# Patient Record
Sex: Male | Born: 1955 | Race: White | Hispanic: No | Marital: Married | Smoking: Former smoker
Health system: Southern US, Community
[De-identification: ages and names within clinical notes are randomized; demographics above are authoritative.]

## PROBLEM LIST (undated history)

## (undated) DIAGNOSIS — I10 Essential (primary) hypertension: Secondary | ICD-10-CM

## (undated) DIAGNOSIS — K759 Inflammatory liver disease, unspecified: Secondary | ICD-10-CM

## (undated) DIAGNOSIS — M4800 Spinal stenosis, site unspecified: Secondary | ICD-10-CM

## (undated) DIAGNOSIS — M1990 Unspecified osteoarthritis, unspecified site: Secondary | ICD-10-CM

## (undated) HISTORY — DX: Spinal stenosis, site unspecified: M48.00

## (undated) HISTORY — PX: BACK SURGERY: SHX140

## (undated) HISTORY — PX: CARPAL TUNNEL RELEASE: SHX101

## (undated) HISTORY — PX: JOINT REPLACEMENT: SHX530

---

## 2003-05-19 ENCOUNTER — Emergency Department (HOSPITAL_COMMUNITY): Admission: EM | Admit: 2003-05-19 | Discharge: 2003-05-20 | Payer: Self-pay | Admitting: Emergency Medicine

## 2010-09-01 ENCOUNTER — Ambulatory Visit (HOSPITAL_COMMUNITY)
Admission: RE | Admit: 2010-09-01 | Discharge: 2010-09-01 | Disposition: A | Discharge status: Home / Self Care | Payer: BC Managed Care – PPO | Source: Ambulatory Visit | Attending: Orthopaedic Surgery | Admitting: Orthopaedic Surgery

## 2010-09-01 ENCOUNTER — Encounter (HOSPITAL_COMMUNITY)
Admission: RE | Admit: 2010-09-01 | Discharge: 2010-09-01 | Disposition: A | Discharge status: Home / Self Care | Payer: BC Managed Care – PPO | Source: Ambulatory Visit | Attending: Orthopaedic Surgery | Admitting: Orthopaedic Surgery

## 2010-09-01 ENCOUNTER — Other Ambulatory Visit (HOSPITAL_COMMUNITY): Payer: Self-pay | Admitting: Orthopaedic Surgery

## 2010-09-01 DIAGNOSIS — M169 Osteoarthritis of hip, unspecified: Secondary | ICD-10-CM | POA: Insufficient documentation

## 2010-09-01 DIAGNOSIS — Z01812 Encounter for preprocedural laboratory examination: Secondary | ICD-10-CM | POA: Insufficient documentation

## 2010-09-01 DIAGNOSIS — M1611 Unilateral primary osteoarthritis, right hip: Secondary | ICD-10-CM

## 2010-09-01 DIAGNOSIS — Z01818 Encounter for other preprocedural examination: Secondary | ICD-10-CM | POA: Insufficient documentation

## 2010-09-01 DIAGNOSIS — M1610 Unilateral primary osteoarthritis, unspecified hip: Secondary | ICD-10-CM | POA: Insufficient documentation

## 2010-09-01 LAB — DIFFERENTIAL
Basophils Absolute: 0.1 10*3/uL (ref 0.0–0.1)
Eosinophils Absolute: 0.1 10*3/uL (ref 0.0–0.7)
Lymphocytes Relative: 17 % (ref 12–46)
Lymphs Abs: 1.6 10*3/uL (ref 0.7–4.0)
Neutro Abs: 7.2 10*3/uL (ref 1.7–7.7)
Neutrophils Relative %: 74 % (ref 43–77)

## 2010-09-01 LAB — URINALYSIS, ROUTINE W REFLEX MICROSCOPIC
Glucose, UA: NEGATIVE mg/dL
Hgb urine dipstick: NEGATIVE
Ketones, ur: NEGATIVE mg/dL
Leukocytes, UA: NEGATIVE
Nitrite: NEGATIVE
Protein, ur: NEGATIVE mg/dL
Urobilinogen, UA: 0.2 mg/dL (ref 0.0–1.0)
pH: 6 (ref 5.0–8.0)

## 2010-09-01 LAB — CBC
HCT: 45.8 % (ref 39.0–52.0)
MCHC: 34.1 g/dL (ref 30.0–36.0)
MCV: 87.9 fL (ref 78.0–100.0)
Platelets: 263 10*3/uL (ref 150–400)
RBC: 5.21 MIL/uL (ref 4.22–5.81)

## 2010-09-01 LAB — COMPREHENSIVE METABOLIC PANEL
ALT: 16 U/L (ref 0–53)
AST: 18 U/L (ref 0–37)
Albumin: 4.4 g/dL (ref 3.5–5.2)
Alkaline Phosphatase: 93 U/L (ref 39–117)
GFR calc Af Amer: >60 mL/min (ref >60)
GFR calc non Af Amer: >60 mL/min (ref >60)
Potassium: 5.2 mEq/L — ABNORMAL HIGH (ref 3.5–5.1)
Total Bilirubin: 0.3 mg/dL (ref 0.3–1.2)

## 2010-09-01 LAB — APTT: aPTT: 30 seconds (ref 24–37)

## 2010-09-01 LAB — ABO/RH: ABO/RH(D): O POS

## 2010-09-02 LAB — URINE CULTURE
Colony Count: NO GROWTH
Culture: NO GROWTH

## 2010-09-09 ENCOUNTER — Inpatient Hospital Stay (HOSPITAL_COMMUNITY): Payer: BC Managed Care – PPO

## 2010-09-09 ENCOUNTER — Inpatient Hospital Stay (HOSPITAL_COMMUNITY)
Admission: RE | Admit: 2010-09-09 | Discharge: 2010-09-12 | DRG: 818 | Disposition: A | Discharge status: Home / Self Care | Payer: BC Managed Care – PPO | Source: Ambulatory Visit | Attending: Orthopaedic Surgery | Admitting: Orthopaedic Surgery

## 2010-09-09 DIAGNOSIS — IMO0002 Reserved for concepts with insufficient information to code with codable children: Secondary | ICD-10-CM | POA: Diagnosis not present

## 2010-09-09 DIAGNOSIS — F17200 Nicotine dependence, unspecified, uncomplicated: Secondary | ICD-10-CM | POA: Diagnosis present

## 2010-09-09 DIAGNOSIS — M169 Osteoarthritis of hip, unspecified: Principal | ICD-10-CM | POA: Diagnosis present

## 2010-09-09 DIAGNOSIS — Y658 Other specified misadventures during surgical and medical care: Secondary | ICD-10-CM | POA: Diagnosis not present

## 2010-09-09 DIAGNOSIS — E669 Obesity, unspecified: Secondary | ICD-10-CM | POA: Diagnosis present

## 2010-09-09 DIAGNOSIS — M1610 Unilateral primary osteoarthritis, unspecified hip: Principal | ICD-10-CM | POA: Diagnosis present

## 2010-09-09 DIAGNOSIS — D62 Acute posthemorrhagic anemia: Secondary | ICD-10-CM | POA: Diagnosis not present

## 2010-09-09 DIAGNOSIS — I10 Essential (primary) hypertension: Secondary | ICD-10-CM | POA: Diagnosis present

## 2010-09-10 ENCOUNTER — Inpatient Hospital Stay (HOSPITAL_COMMUNITY): Payer: BC Managed Care – PPO

## 2010-09-10 LAB — CBC
Hemoglobin: 11.2 g/dL — ABNORMAL LOW (ref 13.0–17.0)
MCH: 29.6 pg (ref 26.0–34.0)
MCV: 90 fL (ref 78.0–100.0)
WBC: 10.2 10*3/uL (ref 4.0–10.5)

## 2010-09-10 LAB — BASIC METABOLIC PANEL
BUN: 20 mg/dL (ref 6–23)
Calcium: 8.8 mg/dL (ref 8.4–10.5)
Creatinine, Ser: 1.01 mg/dL (ref 0.50–1.35)
GFR calc Af Amer: >60 mL/min (ref >60)
Potassium: 4.6 mEq/L (ref 3.5–5.1)
Sodium: 139 mEq/L (ref 135–145)

## 2010-09-11 LAB — BASIC METABOLIC PANEL
BUN: 11 mg/dL (ref 6–23)
CO2: 31 mEq/L (ref 19–32)
Calcium: 8.7 mg/dL (ref 8.4–10.5)
Chloride: 102 mEq/L (ref 96–112)
GFR calc Af Amer: >60 mL/min (ref >60)
Glucose, Bld: 144 mg/dL — ABNORMAL HIGH (ref 70–99)
Sodium: 137 mEq/L (ref 135–145)

## 2010-09-11 LAB — CBC
HCT: 29.6 % — ABNORMAL LOW (ref 39.0–52.0)
MCHC: 34.1 g/dL (ref 30.0–36.0)
MCV: 87.6 fL (ref 78.0–100.0)
Platelets: 188 10*3/uL (ref 150–400)

## 2010-09-12 ENCOUNTER — Inpatient Hospital Stay (HOSPITAL_COMMUNITY): Payer: BC Managed Care – PPO

## 2010-09-12 LAB — CBC
Hemoglobin: 10.2 g/dL — ABNORMAL LOW (ref 13.0–17.0)
MCHC: 33.4 g/dL (ref 30.0–36.0)
MCV: 89.2 fL (ref 78.0–100.0)
RBC: 3.42 MIL/uL — ABNORMAL LOW (ref 4.22–5.81)
RDW: 12.8 % (ref 11.5–15.5)
WBC: 12.2 10*3/uL — ABNORMAL HIGH (ref 4.0–10.5)

## 2010-09-12 LAB — CROSSMATCH
Unit division: 0
Unit division: 0

## 2010-09-12 LAB — BASIC METABOLIC PANEL
CO2: 32 mEq/L (ref 19–32)
Calcium: 9 mg/dL (ref 8.4–10.5)
Creatinine, Ser: 0.75 mg/dL (ref 0.50–1.35)
GFR calc Af Amer: >60 mL/min (ref >60)

## 2010-09-12 LAB — URINALYSIS, ROUTINE W REFLEX MICROSCOPIC
Glucose, UA: NEGATIVE mg/dL
Hgb urine dipstick: NEGATIVE
Specific Gravity, Urine: 1.018 (ref 1.005–1.030)
Urobilinogen, UA: 1 mg/dL (ref 0.0–1.0)

## 2010-09-13 LAB — URINE CULTURE: Culture: NO GROWTH

## 2010-09-17 NOTE — Op Note (Signed)
NAMEMarland Stevenson  JACEON, HEIBERGER NO.:  [id number]  MEDICAL RECORD NO.:  [id number]  LOCATION:  5009                         FACILITY:  [facility]  PHYSICIAN:  [name]. [name], M.D.DATE OF BIRTH:  05-02-1955  DATE OF PROCEDURE:  09/09/2010 DATE OF DISCHARGE:                              OPERATIVE REPORT   PREOPERATIVE DIAGNOSIS:  End-stage osteoarthritis, right hip.  POSTOPERATIVE DIAGNOSIS:  End-stage osteoarthritis, right hip.  PROCEDURE:  Right total hip replacement.  SURGEON:  [name]. [name], MD  ASSISTANT:  [name]. [name], PA-C  COMPLICATIONS:  Small crack in the calcar fixed with Dall-Miles cable.  COMPONENTS:  DePuy AML 13.5 mm small stature femoral stem, a 36-mm outer diameter hip ball with a +5 mm neck length, 52-mm outer diameter metallic acetabulum press-fit with a Marathon +4 polyp polyethylene component, and an apex hole eliminator.  PROCEDURE IN DETAIL:  Mr. Theodore Stevenson was met in the holding area, identified the right lower extremity as the appropriate operative extremity, and any questions were answered.  He was then transported to room #1 and placed under general orotracheal anesthesia without difficulty.  Nursing staff inserted a Foley catheter.  Urine was clear.  The patient was then placed in the lateral decubitus position with the right side up and secured to the operating room table with the Innomed hip system.  The right lower extremity was prepped from iliac crest to the midcalf with chlorhexidine scrub and then DuraPrep.  Sterile draping was performed.  A routine southern incision was utilized and via sharp dissection carried down to the subcutaneous tissue.  Gross bleeders were Bovie coagulated.  Adipose tissue was incised to the level of the iliotibial band.  Self-retaining retractors were inserted.  The iliotibial band was then incised along the length of the skin incision.  Retractors were placed more deeply.  With the hip  internally rotated, the short external rotators were identified.  Tendinous structures were tagged with 0- Ethibond suture.  The capsule was then identified and incised along the femoral neck and head.  There was at least 10 mL clear yellow joint effusion.  At that point, the head was dislocated posteriorly.  There was flattening with at least 40% of the head with loss of articular cartilage and abundant synovitis.  Using the calcar guide, the femoral head was osteotomized at about 4 mm proximal to the head-neck junction.  Head was then removed. The acetabulum was inspected.  There were loose pieces of articular cartilage within the joint that were removed and also abundant synovitis which was also resected.  The Muller retractor was then placed around the femoral neck.  It appeared to be too long, but I proceeded with reaming.  A starter hole was then made in the piriformis fossa followed by the canal finder. Reaming was performed to 13 mm to accept a 13.5 mm component.  The initial 10.5 mm rasp was inserted and using it as a guide, I re-cut the calcar at a point about a fingerbreadth proximal to the lesser trochanter.  Reaming was then performed., then I re-reamed to 13 mm and then inserted the 10.5 component which fit very nicely.  We had templated a 15-mm component, but  I felt that was too large and proceeded with subsequent reaming to a 12 mm and then a 13.5 mm small stature rasp.  About three-quarters to the way into the canal with the rasp, there was a crack in the calcar and it would open up about a millimeter and I was concerned about propagation, so I inserted a [employer] cable without difficulty.  There was maybe a millimeter separation but it did not propagate after insertion of the cable.  It had a nice fit with about 20 degrees of anteversion on the 13.5 mm femoral component.  It was nice and tight.  I did use the side reamer along to be sure that I had plenty of lateral  reaming.  The acetabulum was then exposed.  Acetabulum retractors were inserted. There was a tear of the labrum and a large well-developed labrum that was sharply excised with a #15 blade knife.  I had nice exposure of the acetabulum and reamed to a 51 to accept a 52-mm component.  The acetabulum was shallow and I deepened it nicely.  I then trialed a 50-mm component, it had nice rim fit but it would completely seat and then trialed a 52 and it had nice rim fit but would not completely seat. Accordingly, the 52-mm outer diameter sector 3 acetabular component was then impacted.  It was nice and tight.  I did not require any screws.  I then inserted the trial polyethylene liner.  The 13.5 mm small stature rasp was then carefully impacted.  It was probably several millimeters proud but there was no propagation of the crack with the [employer] cable.  I then applied the 36-mm outer diameter hip ball with a +5 neck length and then reduced the entire construct.  Through a full range of motion, we had perfect stability. There was no toggling.  It felt like a reestablished leg length since he was approximately a 0.5 to 5/8th of an inch short preoperatively.  The trial components were then removed.  The joint was copiously irrigated with saline solution.  The apex hole eliminator was inserted followed by the Marathon polyethylene +4 liner.  The wound was again irrigated with saline solution.  The +5 final 36-mm outer diameter hip ball was then applied to the [facility] taper stem and then reduced.  Again through a full range of motion, we had perfect stability.  We made sure that the Mattie Marlin cable was perfectly tight.  We then crimped it and then cut off the end and it was not proud nor it was impinging.  The wound was again irrigated with saline solution.  We checked the calcar crack and it was probably a millimeter.  The capsule was then closed anatomically with #1 Ethibond.  Short external  rotators were closed with the same material.  The wound was again irrigated.  The iliotibial band was closed with a running 0-Vicryl subcu and several layers with Vicryl and 3-0 Monocryl in the subcu. Skin was closed with skin clips.  Sterile bulky dressing was applied.  The patient tolerated the procedure without any anesthetic complications.     [name]. [name], M.D.    PWW/MEDQ  D:  09/09/2010  T:  09/09/2010  Job:  [id number]  Electronically Signed by [name] M.D. on 09/17/2010 02:06:34 PM

## 2011-06-18 ENCOUNTER — Encounter (HOSPITAL_COMMUNITY): Payer: Self-pay | Admitting: Pharmacy Technician

## 2011-06-22 ENCOUNTER — Encounter (HOSPITAL_COMMUNITY): Payer: Self-pay | Admitting: *Deleted

## 2011-06-24 ENCOUNTER — Encounter (HOSPITAL_COMMUNITY)
Admission: RE | Admit: 2011-06-24 | Discharge: 2011-06-24 | Disposition: A | Discharge status: Home / Self Care | Payer: BC Managed Care – PPO | Source: Ambulatory Visit | Attending: Orthopaedic Surgery | Admitting: Orthopaedic Surgery

## 2011-06-24 LAB — DIFFERENTIAL
Eosinophils Absolute: 0.1 10*3/uL (ref 0.0–0.7)
Eosinophils Relative: 2 % (ref 0–5)
Lymphocytes Relative: 24 % (ref 12–46)
Lymphs Abs: 1.8 10*3/uL (ref 0.7–4.0)
Monocytes Relative: 12 % (ref 3–12)

## 2011-06-24 LAB — COMPREHENSIVE METABOLIC PANEL
ALT: 28 U/L (ref 0–53)
Albumin: 3.8 g/dL (ref 3.5–5.2)
Alkaline Phosphatase: 107 U/L (ref 39–117)
Calcium: 9.7 mg/dL (ref 8.4–10.5)
Potassium: 4.3 mEq/L (ref 3.5–5.1)
Sodium: 138 mEq/L (ref 135–145)
Total Protein: 6.9 g/dL (ref 6.0–8.3)

## 2011-06-24 LAB — URINALYSIS, ROUTINE W REFLEX MICROSCOPIC
Bilirubin Urine: NEGATIVE
Ketones, ur: NEGATIVE mg/dL
Leukocytes, UA: NEGATIVE
Nitrite: NEGATIVE
Urobilinogen, UA: 0.2 mg/dL (ref 0.0–1.0)
pH: 5.5 (ref 5.0–8.0)

## 2011-06-24 LAB — PROTIME-INR
INR: 0.98 (ref 0.00–1.49)
Prothrombin Time: 13.2 seconds (ref 11.6–15.2)

## 2011-06-24 LAB — TYPE AND SCREEN: Antibody Screen: NEGATIVE

## 2011-06-24 LAB — APTT: aPTT: 28 seconds (ref 24–37)

## 2011-06-24 LAB — SURGICAL PCR SCREEN
MRSA, PCR: NEGATIVE
Staphylococcus aureus: NEGATIVE

## 2011-06-24 LAB — CBC
MCH: 30.2 pg (ref 26.0–34.0)
MCHC: 33.2 g/dL (ref 30.0–36.0)
Platelets: 231 10*3/uL (ref 150–400)
RDW: 12.9 % (ref 11.5–15.5)

## 2011-06-24 NOTE — Pre-Procedure Instructions (Signed)
20 Theodore Stevenson  06/24/2011   Your procedure is scheduled on:  Tuesday June 18  Report to [facility] at 11:30 AM.  Call this number if you have problems the morning of surgery: [phone]   Remember:   Do not eat or drink:After Midnight.    Take these medicines the morning of surgery with A SIP OF WATER: Tramadol if needed   Do not wear jewelry, make-up or nail polish.  Do not wear lotions, powders, or perfumes. You may wear deodorant.  Do not shave 48 hours prior to surgery. Men may shave face and neck.  Do not bring valuables to the hospital.  Contacts, dentures or bridgework may not be worn into surgery.  Leave suitcase in the car. After surgery it may be brought to your room.  For patients admitted to the hospital, checkout time is 11:00 AM the day of discharge.   Patients discharged the day of surgery will not be allowed to drive home.  Name and phone number of your driver: NA  Special Instructions: Incentive Spirometry - Practice and bring it with you on the day of surgery. and CHG Shower Use Special Wash: 1/2 bottle night before surgery and 1/2 bottle morning of surgery.   Please read over the following fact sheets that you were given: Pain Booklet, Coughing and Deep Breathing, Blood Transfusion Information, Total Joint Packet and Surgical Site Infection Prevention

## 2011-06-25 LAB — URINE CULTURE: Culture  Setup Time: 201306122154

## 2011-06-25 NOTE — H&P (Signed)
CHIEF COMPLAINT:  Painful left hip.   HISTORY:   Theodore Stevenson is a very pleasant 56 year old white male who is seen today for evaluation of his left hip. He has had problems with pain in the left hip dating back to earlier this year. He was initially seen in the office on the 20th of March 2013. At that time he started having a gradual onset of left hip and knee pain which started without any history of injury or trauma. He was to the point that he was having more groin pain and anterior thigh pain at that time. He was having this moderate stabbing pain and it had not really improved at all. He had taken a couple of Aleve but nothing had been helping him. He states that it was very similar to what he had in his right hip prior to total hip replacement. He denied any neurovascular compromise at that time. He did have a corticosteroid injection on the 25th of March 2013 as well as April 22, 2011. He has most recently had injections for lumbosacral complaints but at his last injection on May 19, 2011 he was noted to have some change in the femoral head. He returned to the office on Jun 10, 2011 in rather excruciating pain and discomfort. His last injection on May 29th was of no benefit and it actually started to worsen to the point where he is using crutches. He now is in constant severe throbbing and aching pain especially in his groin. He is also having some anterior thigh pain. Denies any numbness or neurologic symptoms. The pain is aching and grabbing and relentless. He is unable to sleep. He is unable to ambulate. He is unable to do any of the activities of daily living. He was noted on x-rays of the 29th of May that he had complete collapse of his femoral head probably consistent with avascular necrosis. At that time he was given clearance forms for possible upcoming surgery and was treated as best we could with medications. He had smoked previously but he has been dipping tobacco and swallowing it also.  He has had  several courses of prednisone in the past. He returns today now with increasing symptoms and pain and would like to consider a total hip arthroplasty.  PAST MEDICAL HISTORY:   In general his health is good. Surgeries have included that in 1987 for back surgery.  2011 and 2012 for carpal tunnel surgery in both hands. 2012 for a right total hip arthroplasty.  CURRENT MEDICATION:  Medications include that of Percocet 5/325 1-2 q.4-6 h. p.r.n. pain. Benazepril 40 mg daily. Vytorin 10/40 daily. Flexeril 10 mg t.i.d.  Allergies: Penicillin, Darvocet, and codeine. He did have some sensitivity to Percocet causing him irritability and being wired. Have used Ultram.  REVIEW OF SYSTEMS:   A 14 point review of systems is positive for glasses and contacts as well as tinnitus. He does have occasional bronchitis. He does have a history of hypertension and is presently on medications for this, that of benazepril. He did have hepatitis in 1973. Previous history of ulcers.  FAMILY HISTORY:  Family history is unremarkable.   SOCIAL HISTORY:  He is a pleasant 56 year old white male who is married and self-employed in [employer]. He has quit smoking cigarettes but does have a 10-20 pack year history. He states he does not drink. He does and has chewed tobacco up until 2012.  PHYSICAL EXAM:  Examination today reveals a very pleasant 56 year old white male.  Well-developed, well-nourished, alert, pleasant and cooperative.  He is 5 foot 5 inches and weighs 180 pounds with a BMI of 30.0.  Vital signs reveal a temperature of 97.5, pulse 83, respirations 18, blood pressure 164/78.  Head is normocephalic. Eyes: pupils equal, round and react to light and accommodation with extraocular movements intact.   Neck was supple; no bruits. Chest had good expansion. Lungs were clear to auscultation. Cardiac had a regular rhythm and rate; normal S1-S2. No discrete murmurs were noted. Abdomen is scaphoid, soft and nontender.  No mass palpable. Normal bowel sounds present. CNS: he is oriented x3 and cranial nerves II through XII are grossly intact. Genital, rectal, and breast exams not indicated for surgery. Musculoskeletal: he has essentially no motion in his left hip. Any motion, internal and external rotation causes him extreme pain and discomfort. He is able to sit with the hip to 90 degrees. He is neurovascularly intact distally. He has good knee motion. Skin is intact.  I have reviewed a preoperative clearance form from Dr. Doristine Counter who states that from his standpoint he feels that he is cleared for surgery from a medical and cardiac standpoint. If any consultation is necessary we are to obtain it from the hospitalist.  X-RAYS: X-rays reveal marked flattening of the femoral head with essentially AVN appearance. 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  CLINICAL IMPRESSION:   1.  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  2.  Hypertension. 3.  History of hepatitis. 4.  History of phlebitis/blood clots.   recommendations: At this time we feel that he would be a candidate for a left total hip arthroplasty. Procedure risks and benefits were explained to him again and he is fully understanding. He has had all his questions answered. Therefore we will proceed with total joint replacement in the near future.   [name] Aleda Grana [facility] [phone]  06/25/2011 8:16 PM

## 2011-06-29 MED ORDER — CEFAZOLIN SODIUM-DEXTROSE 2-3 GM-% IV SOLR
2.0000 g | INTRAVENOUS | Status: AC
  Administered 2011-06-30 (×2): .5 g via INTRAVENOUS
  Filled 2011-06-29: qty 50

## 2011-06-29 NOTE — Progress Notes (Signed)
Notified pt. Of time change. Instructed him to be here at 1030 tomorrow.

## 2011-06-30 ENCOUNTER — Encounter (HOSPITAL_COMMUNITY)
Admission: RE | Disposition: A | Discharge status: Home / Self Care | Payer: Self-pay | Source: Ambulatory Visit | Attending: Orthopaedic Surgery

## 2011-06-30 ENCOUNTER — Encounter (HOSPITAL_COMMUNITY): Payer: Self-pay | Admitting: Certified Registered"

## 2011-06-30 ENCOUNTER — Inpatient Hospital Stay (HOSPITAL_COMMUNITY)
Admission: RE | Admit: 2011-06-30 | Discharge: 2011-07-02 | DRG: 818 | Disposition: A | Discharge status: Home / Self Care | Payer: BC Managed Care – PPO | Source: Ambulatory Visit | Attending: Orthopaedic Surgery | Admitting: Orthopaedic Surgery

## 2011-06-30 ENCOUNTER — Ambulatory Visit (HOSPITAL_COMMUNITY): Payer: BC Managed Care – PPO | Admitting: Certified Registered"

## 2011-06-30 ENCOUNTER — Encounter (HOSPITAL_COMMUNITY): Payer: Self-pay | Admitting: *Deleted

## 2011-06-30 ENCOUNTER — Ambulatory Visit (HOSPITAL_COMMUNITY): Payer: BC Managed Care – PPO

## 2011-06-30 DIAGNOSIS — Z87891 Personal history of nicotine dependence: Secondary | ICD-10-CM

## 2011-06-30 DIAGNOSIS — I10 Essential (primary) hypertension: Secondary | ICD-10-CM | POA: Diagnosis present

## 2011-06-30 DIAGNOSIS — D62 Acute posthemorrhagic anemia: Secondary | ICD-10-CM | POA: Diagnosis not present

## 2011-06-30 DIAGNOSIS — M8700 Idiopathic aseptic necrosis of unspecified bone: Secondary | ICD-10-CM | POA: Diagnosis present

## 2011-06-30 DIAGNOSIS — Z96649 Presence of unspecified artificial hip joint: Secondary | ICD-10-CM

## 2011-06-30 DIAGNOSIS — E7800 Pure hypercholesterolemia, unspecified: Secondary | ICD-10-CM | POA: Diagnosis present

## 2011-06-30 DIAGNOSIS — M87059 Idiopathic aseptic necrosis of unspecified femur: Secondary | ICD-10-CM | POA: Diagnosis present

## 2011-06-30 DIAGNOSIS — Z885 Allergy status to narcotic agent status: Secondary | ICD-10-CM

## 2011-06-30 DIAGNOSIS — Z880 Allergy status to penicillin: Secondary | ICD-10-CM

## 2011-06-30 DIAGNOSIS — Z01812 Encounter for preprocedural laboratory examination: Secondary | ICD-10-CM

## 2011-06-30 DIAGNOSIS — Z86718 Personal history of other venous thrombosis and embolism: Secondary | ICD-10-CM

## 2011-06-30 DIAGNOSIS — Z8619 Personal history of other infectious and parasitic diseases: Secondary | ICD-10-CM

## 2011-06-30 DIAGNOSIS — M169 Osteoarthritis of hip, unspecified: Principal | ICD-10-CM | POA: Diagnosis present

## 2011-06-30 DIAGNOSIS — M1610 Unilateral primary osteoarthritis, unspecified hip: Principal | ICD-10-CM | POA: Diagnosis present

## 2011-06-30 HISTORY — PX: TOTAL HIP ARTHROPLASTY: SHX124

## 2011-06-30 HISTORY — PX: HIP SURGERY: SHX245

## 2011-06-30 HISTORY — DX: Unspecified osteoarthritis, unspecified site: M19.90

## 2011-06-30 HISTORY — DX: Inflammatory liver disease, unspecified: K75.9

## 2011-06-30 HISTORY — DX: Essential (primary) hypertension: I10

## 2011-06-30 SURGERY — ARTHROPLASTY, HIP, TOTAL,POSTERIOR APPROACH
Anesthesia: General | Site: Hip | Laterality: Left | Wound class: Clean

## 2011-06-30 MED ORDER — SENNOSIDES-DOCUSATE SODIUM 8.6-50 MG PO TABS: 1.0000 | ORAL_TABLET | Freq: Every evening | ORAL | Status: DC | PRN

## 2011-06-30 MED ORDER — PHENOL 1.4 % MT LIQD: 1.0000 | OROMUCOSAL | Status: DC | PRN

## 2011-06-30 MED ORDER — EZETIMIBE-SIMVASTATIN 10-40 MG PO TABS
1.0000 | ORAL_TABLET | Freq: Every day | ORAL | Status: DC
  Administered 2011-07-01 – 2011-07-02 (×2): 1 via ORAL
  Filled 2011-06-30 (×2): qty 1

## 2011-06-30 MED ORDER — PROMETHAZINE HCL 25 MG/ML IJ SOLN: 6.2500 mg | INTRAMUSCULAR | Status: DC | PRN

## 2011-06-30 MED ORDER — ACETAMINOPHEN 10 MG/ML IV SOLN
INTRAVENOUS | Status: AC
  Filled 2011-06-30: qty 100

## 2011-06-30 MED ORDER — METHOCARBAMOL 500 MG PO TABS
500.0000 mg | ORAL_TABLET | Freq: Four times a day (QID) | ORAL | Status: DC | PRN
  Administered 2011-06-30 – 2011-07-02 (×3): 500 mg via ORAL
  Filled 2011-06-30 (×3): qty 1

## 2011-06-30 MED ORDER — CEFAZOLIN SODIUM-DEXTROSE 2-3 GM-% IV SOLR
2.0000 g | Freq: Four times a day (QID) | INTRAVENOUS | Status: AC
  Administered 2011-06-30 (×2): 2 g via INTRAVENOUS
  Filled 2011-06-30 (×2): qty 50

## 2011-06-30 MED ORDER — NEOSTIGMINE METHYLSULFATE 1 MG/ML IJ SOLN
INTRAMUSCULAR | Status: DC | PRN
  Administered 2011-06-30: 4 mg via INTRAVENOUS

## 2011-06-30 MED ORDER — SODIUM CHLORIDE 0.9 % IV SOLN: INTRAVENOUS | Status: DC

## 2011-06-30 MED ORDER — KETOROLAC TROMETHAMINE 15 MG/ML IJ SOLN
15.0000 mg | Freq: Four times a day (QID) | INTRAMUSCULAR | Status: AC
  Administered 2011-06-30 – 2011-07-01 (×4): 15 mg via INTRAVENOUS
  Filled 2011-06-30 (×5): qty 1

## 2011-06-30 MED ORDER — ACETAMINOPHEN 10 MG/ML IV SOLN
1000.0000 mg | Freq: Once | INTRAVENOUS | Status: AC
  Administered 2011-06-30: 1000 mg via INTRAVENOUS
  Filled 2011-06-30: qty 100

## 2011-06-30 MED ORDER — ALUM & MAG HYDROXIDE-SIMETH 200-200-20 MG/5ML PO SUSP: 30.0000 mL | ORAL | Status: DC | PRN

## 2011-06-30 MED ORDER — BISACODYL 10 MG RE SUPP: 10.0000 mg | Freq: Every day | RECTAL | Status: DC | PRN

## 2011-06-30 MED ORDER — CHLORHEXIDINE GLUCONATE 4 % EX LIQD: 60.0000 mL | Freq: Every day | CUTANEOUS | Status: DC

## 2011-06-30 MED ORDER — PROPOFOL 10 MG/ML IV EMUL
INTRAVENOUS | Status: DC | PRN
  Administered 2011-06-30: 200 mg via INTRAVENOUS

## 2011-06-30 MED ORDER — ONDANSETRON HCL 4 MG/2ML IJ SOLN: 4.0000 mg | Freq: Four times a day (QID) | INTRAMUSCULAR | Status: DC | PRN

## 2011-06-30 MED ORDER — DOCUSATE SODIUM 100 MG PO CAPS
100.0000 mg | ORAL_CAPSULE | Freq: Two times a day (BID) | ORAL | Status: DC
  Administered 2011-06-30 – 2011-07-02 (×4): 100 mg via ORAL
  Filled 2011-06-30 (×6): qty 1

## 2011-06-30 MED ORDER — CHLORHEXIDINE GLUCONATE 4 % EX LIQD: 60.0000 mL | Freq: Once | CUTANEOUS | Status: DC

## 2011-06-30 MED ORDER — PHENYLEPHRINE HCL 10 MG/ML IJ SOLN
INTRAMUSCULAR | Status: DC | PRN
  Administered 2011-06-30 (×2): 80 ug via INTRAVENOUS

## 2011-06-30 MED ORDER — DROPERIDOL 2.5 MG/ML IJ SOLN
INTRAMUSCULAR | Status: DC | PRN
  Administered 2011-06-30: 0.625 mg via INTRAVENOUS

## 2011-06-30 MED ORDER — METOCLOPRAMIDE HCL 5 MG/ML IJ SOLN: 5.0000 mg | Freq: Three times a day (TID) | INTRAMUSCULAR | Status: DC | PRN

## 2011-06-30 MED ORDER — LIDOCAINE HCL (CARDIAC) 20 MG/ML IV SOLN
INTRAVENOUS | Status: DC | PRN
  Administered 2011-06-30: 100 mg via INTRAVENOUS

## 2011-06-30 MED ORDER — RIVAROXABAN 10 MG PO TABS
10.0000 mg | ORAL_TABLET | Freq: Every day | ORAL | Status: DC
  Administered 2011-07-01 – 2011-07-02 (×2): 10 mg via ORAL
  Filled 2011-06-30 (×2): qty 1

## 2011-06-30 MED ORDER — SODIUM CHLORIDE 0.9 % IV SOLN
INTRAVENOUS | Status: DC
  Administered 2011-06-30: 17:00:00 via INTRAVENOUS

## 2011-06-30 MED ORDER — ROCURONIUM BROMIDE 100 MG/10ML IV SOLN
INTRAVENOUS | Status: DC | PRN
  Administered 2011-06-30: 50 mg via INTRAVENOUS

## 2011-06-30 MED ORDER — KETOROLAC TROMETHAMINE 30 MG/ML IJ SOLN: 15.0000 mg | Freq: Once | INTRAMUSCULAR | Status: DC | PRN

## 2011-06-30 MED ORDER — MENTHOL 3 MG MT LOZG: 1.0000 | LOZENGE | OROMUCOSAL | Status: DC | PRN

## 2011-06-30 MED ORDER — HYDROMORPHONE HCL PF 1 MG/ML IJ SOLN
INTRAMUSCULAR | Status: AC
  Filled 2011-06-30: qty 1

## 2011-06-30 MED ORDER — ONDANSETRON HCL 4 MG/2ML IJ SOLN
INTRAMUSCULAR | Status: DC | PRN
  Administered 2011-06-30: 4 mg via INTRAVENOUS

## 2011-06-30 MED ORDER — HYDROMORPHONE HCL 2 MG PO TABS
2.0000 mg | ORAL_TABLET | ORAL | Status: DC | PRN
  Administered 2011-06-30 – 2011-07-02 (×8): 4 mg via ORAL
  Filled 2011-06-30 (×8): qty 2

## 2011-06-30 MED ORDER — ACETAMINOPHEN 10 MG/ML IV SOLN
1000.0000 mg | Freq: Four times a day (QID) | INTRAVENOUS | Status: AC
  Administered 2011-06-30 – 2011-07-01 (×4): 1000 mg via INTRAVENOUS
  Filled 2011-06-30 (×6): qty 100

## 2011-06-30 MED ORDER — MEPERIDINE HCL 25 MG/ML IJ SOLN: 6.2500 mg | INTRAMUSCULAR | Status: DC | PRN

## 2011-06-30 MED ORDER — VECURONIUM BROMIDE 10 MG IV SOLR
INTRAVENOUS | Status: DC | PRN
  Administered 2011-06-30: 1 mg via INTRAVENOUS

## 2011-06-30 MED ORDER — HYDROMORPHONE HCL PF 1 MG/ML IJ SOLN
0.5000 mg | INTRAMUSCULAR | Status: DC | PRN
  Administered 2011-06-30: 0.5 mg via INTRAVENOUS

## 2011-06-30 MED ORDER — LABETALOL HCL 5 MG/ML IV SOLN
INTRAVENOUS | Status: DC | PRN
  Administered 2011-06-30: 5 mg via INTRAVENOUS

## 2011-06-30 MED ORDER — MORPHINE SULFATE 2 MG/ML IJ SOLN
INTRAMUSCULAR | Status: DC | PRN
  Administered 2011-06-30 (×2): 4 mg via INTRAVENOUS

## 2011-06-30 MED ORDER — BUPIVACAINE-EPINEPHRINE 0.5% -1:200000 IJ SOLN
INTRAMUSCULAR | Status: DC | PRN
  Administered 2011-06-30: 30 mL

## 2011-06-30 MED ORDER — HYDROMORPHONE HCL PF 1 MG/ML IJ SOLN
0.2500 mg | INTRAMUSCULAR | Status: DC | PRN
  Administered 2011-06-30: 0.5 mg via INTRAVENOUS

## 2011-06-30 MED ORDER — FLEET ENEMA 7-19 GM/118ML RE ENEM: 1.0000 | ENEMA | Freq: Once | RECTAL | Status: AC | PRN

## 2011-06-30 MED ORDER — VECURONIUM BROMIDE 10 MG IV SOLR
INTRAVENOUS | Status: DC | PRN
  Administered 2011-06-30: 2 mg via INTRAVENOUS
  Administered 2011-06-30 (×2): 1 mg via INTRAVENOUS

## 2011-06-30 MED ORDER — BUPIVACAINE-EPINEPHRINE (PF) 0.5% -1:200000 IJ SOLN
INTRAMUSCULAR | Status: AC
  Filled 2011-06-30: qty 10

## 2011-06-30 MED ORDER — MORPHINE SULFATE 10 MG/ML IJ SOLN
INTRAMUSCULAR | Status: DC | PRN
  Administered 2011-06-30: 2 mg via INTRAVENOUS

## 2011-06-30 MED ORDER — METHOCARBAMOL 100 MG/ML IJ SOLN
500.0000 mg | Freq: Four times a day (QID) | INTRAVENOUS | Status: DC | PRN
  Administered 2011-06-30: 500 mg via INTRAVENOUS
  Filled 2011-06-30: qty 5

## 2011-06-30 MED ORDER — BENAZEPRIL HCL 40 MG PO TABS
40.0000 mg | ORAL_TABLET | Freq: Every day | ORAL | Status: DC
  Administered 2011-07-01 – 2011-07-02 (×2): 40 mg via ORAL
  Filled 2011-06-30 (×2): qty 1

## 2011-06-30 MED ORDER — FENTANYL CITRATE 0.05 MG/ML IJ SOLN
INTRAMUSCULAR | Status: DC | PRN
  Administered 2011-06-30: 50 ug via INTRAVENOUS
  Administered 2011-06-30: 100 ug via INTRAVENOUS
  Administered 2011-06-30: 50 ug via INTRAVENOUS
  Administered 2011-06-30: 100 ug via INTRAVENOUS
  Administered 2011-06-30: 50 ug via INTRAVENOUS
  Administered 2011-06-30 (×2): 100 ug via INTRAVENOUS
  Administered 2011-06-30 (×2): 50 ug via INTRAVENOUS
  Administered 2011-06-30: 100 ug via INTRAVENOUS

## 2011-06-30 MED ORDER — GLYCOPYRROLATE 0.2 MG/ML IJ SOLN
INTRAMUSCULAR | Status: DC | PRN
  Administered 2011-06-30: .8 mg via INTRAVENOUS

## 2011-06-30 MED ORDER — MIDAZOLAM HCL 5 MG/5ML IJ SOLN
INTRAMUSCULAR | Status: DC | PRN
  Administered 2011-06-30: 2 mg via INTRAVENOUS

## 2011-06-30 MED ORDER — METOCLOPRAMIDE HCL 5 MG PO TABS
5.0000 mg | ORAL_TABLET | Freq: Three times a day (TID) | ORAL | Status: DC | PRN
  Filled 2011-06-30: qty 2

## 2011-06-30 MED ORDER — ONDANSETRON HCL 4 MG PO TABS: 4.0000 mg | ORAL_TABLET | Freq: Four times a day (QID) | ORAL | Status: DC | PRN

## 2011-06-30 MED ORDER — SODIUM CHLORIDE 0.9 % IR SOLN
Status: DC | PRN
  Administered 2011-06-30: 2000 mL

## 2011-06-30 MED ORDER — LACTATED RINGERS IV SOLN
INTRAVENOUS | Status: DC | PRN
  Administered 2011-06-30 (×3): via INTRAVENOUS

## 2011-06-30 SURGICAL SUPPLY — 61 items
BLADE SAW SAG 73X25 THK (BLADE) ×1
BLADE SAW SGTL 73X25 THK (BLADE) ×1 IMPLANT
BRUSH FEMORAL CANAL (MISCELLANEOUS) IMPLANT
CANISTER SUCTION 2500CC (MISCELLANEOUS) ×2 IMPLANT
CLOTH BEACON ORANGE TIMEOUT ST (SAFETY) ×2 IMPLANT
COVER BACK TABLE 24X17X13 BIG (DRAPES) IMPLANT
COVER SURGICAL LIGHT HANDLE (MISCELLANEOUS) ×2 IMPLANT
DRAPE INCISE IOBAN 66X45 STRL (DRAPES) IMPLANT
DRAPE ORTHO SPLIT 77X108 STRL (DRAPES) ×2
DRAPE SURG ORHT 6 SPLT 77X108 (DRAPES) ×2 IMPLANT
DRSG ADAPTIC 3X8 NADH LF (GAUZE/BANDAGES/DRESSINGS) ×2 IMPLANT
DRSG MEPILEX BORDER 4X12 (GAUZE/BANDAGES/DRESSINGS) IMPLANT
DURAPREP 26ML APPLICATOR (WOUND CARE) ×2 IMPLANT
ELECT BLADE 6.5 EXT (BLADE) IMPLANT
ELECT REM PT RETURN 9FT ADLT (ELECTROSURGICAL) ×2
ELECTRODE REM PT RTRN 9FT ADLT (ELECTROSURGICAL) ×1 IMPLANT
EVACUATOR 1/8 PVC DRAIN (DRAIN) IMPLANT
FACESHIELD LNG OPTICON STERILE (SAFETY) ×4 IMPLANT
GLOVE BIOGEL PI IND STRL 7.0 (GLOVE) ×1 IMPLANT
GLOVE BIOGEL PI IND STRL 8 (GLOVE) ×2 IMPLANT
GLOVE BIOGEL PI IND STRL 8.5 (GLOVE) ×1 IMPLANT
GLOVE BIOGEL PI INDICATOR 7.0 (GLOVE) ×1
GLOVE BIOGEL PI INDICATOR 8 (GLOVE) ×2
GLOVE BIOGEL PI INDICATOR 8.5 (GLOVE) ×1
GLOVE ECLIPSE 8.0 STRL XLNG CF (GLOVE) ×2 IMPLANT
GLOVE SURG ORTHO 8.5 STRL (GLOVE) ×4 IMPLANT
GLOVE SURG SS PI 6.5 STRL IVOR (GLOVE) ×2 IMPLANT
GLOVE SURG SS PI 7.5 STRL IVOR (GLOVE) ×2 IMPLANT
GOWN PREVENTION PLUS XLARGE (GOWN DISPOSABLE) ×2 IMPLANT
GOWN STRL NON-REIN LRG LVL3 (GOWN DISPOSABLE) ×4 IMPLANT
GOWN STRL REIN 2XL XLG LVL4 (GOWN DISPOSABLE) ×2 IMPLANT
HANDPIECE INTERPULSE COAX TIP (DISPOSABLE)
IMMOBILIZER KNEE 20 (SOFTGOODS)
IMMOBILIZER KNEE 20 THIGH 36 (SOFTGOODS) IMPLANT
IMMOBILIZER KNEE 22 UNIV (SOFTGOODS) ×2 IMPLANT
IMMOBILIZER KNEE 24 THIGH 36 (MISCELLANEOUS) IMPLANT
IMMOBILIZER KNEE 24 UNIV (MISCELLANEOUS)
KIT BASIN OR (CUSTOM PROCEDURE TRAY) ×2 IMPLANT
KIT ROOM TURNOVER OR (KITS) ×2 IMPLANT
MANIFOLD NEPTUNE II (INSTRUMENTS) ×2 IMPLANT
NEEDLE 22X1 1/2 (OR ONLY) (NEEDLE) ×2 IMPLANT
NS IRRIG 1000ML POUR BTL (IV SOLUTION) ×2 IMPLANT
PACK TOTAL JOINT (CUSTOM PROCEDURE TRAY) ×2 IMPLANT
PAD ARMBOARD 7.5X6 YLW CONV (MISCELLANEOUS) ×4 IMPLANT
PRESSURIZER FEMORAL UNIV (MISCELLANEOUS) IMPLANT
SET HNDPC FAN SPRY TIP SCT (DISPOSABLE) IMPLANT
STAPLER VISISTAT 35W (STAPLE) ×2 IMPLANT
SUCTION FRAZIER TIP 10 FR DISP (SUCTIONS) ×2 IMPLANT
SUT BONE WAX W31G (SUTURE) IMPLANT
SUT ETHIBOND NAB CT1 #1 30IN (SUTURE) ×6 IMPLANT
SUT MNCRL AB 3-0 PS2 18 (SUTURE) ×2 IMPLANT
SUT VIC AB 0 CT1 27 (SUTURE) ×2
SUT VIC AB 0 CT1 27XBRD ANBCTR (SUTURE) ×2 IMPLANT
SUT VIC AB 2-0 CT1 27 (SUTURE) ×1
SUT VIC AB 2-0 CT1 TAPERPNT 27 (SUTURE) ×1 IMPLANT
SYR CONTROL 10ML LL (SYRINGE) ×2 IMPLANT
TOWEL OR 17X24 6PK STRL BLUE (TOWEL DISPOSABLE) ×2 IMPLANT
TOWEL OR 17X26 10 PK STRL BLUE (TOWEL DISPOSABLE) ×2 IMPLANT
TOWER CARTRIDGE SMART MIX (DISPOSABLE) IMPLANT
TRAY FOLEY CATH 14FR (SET/KITS/TRAYS/PACK) ×2 IMPLANT
WATER STERILE IRR 1000ML POUR (IV SOLUTION) ×4 IMPLANT

## 2011-06-30 NOTE — Transfer of Care (Signed)
Immediate Anesthesia Transfer of Care Note  Patient: Theodore Stevenson  Procedure(s) Performed: Procedure(s) (LRB): TOTAL HIP ARTHROPLASTY (Left)  Patient Location: PACU  Anesthesia Type: General  Level of Consciousness: awake, oriented and patient cooperative  Airway & Oxygen Therapy: Patient Spontanous Breathing and Patient connected to nasal cannula oxygen  Post-op Assessment: Report given to PACU RN and Post -op Vital signs reviewed and stable  Post vital signs: Reviewed and stable  Complications: No apparent anesthesia complications

## 2011-06-30 NOTE — Brief Op Note (Signed)
06/30/2011  1:00 PM  PATIENT:  Theodore Stevenson  56 y.o. male  PRE-OPERATIVE DIAGNOSIS:  osteoarthritis left hip  POST-OPERATIVE DIAGNOSIS:  osteoarthritis left hip  PROCEDURE:  Procedure(s) (LRB): TOTAL HIP ARTHROPLASTY (Left)  SURGEON:  Surgeon(s) and Role:    * Valeria Batman, MD - Primary  PHYSICIAN ASSISTANT: [name], [facility]    ANESTHESIA:   general  EBL:  Total I/O In: 1000 [I.V.:1000] Out: 600 [Urine:100; Blood:500]  BLOOD ADMINISTERED:none  DRAINS: none   LOCAL MEDICATIONS USED:  MARCAINE     SPECIMEN:  No Specimen  DISPOSITION OF SPECIMEN:  N/A  COUNTS:  YES  TOURNIQUET:  * No tourniquets in log *  DICTATION: .Other Dictation: Dictation Number [id number]  PLAN OF CARE: Admit to inpatient   PATIENT DISPOSITION:  PACU - hemodynamically stable.   Delay start of Pharmacological VTE agent (>24hrs) due to surgical blood loss or risk of bleeding: not applicable

## 2011-06-30 NOTE — Op Note (Signed)
NAME:  Theodore Stevenson, Theodore Stevenson             ACCOUNT NO.:  [id number]  MEDICAL RECORD NO.:  [id number]  LOCATION:  [facility]                         FACILITY:  [facility]  PHYSICIAN:  [name]. 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  PROCEDURE:  Left total hip replacement.  SURGEON:  [name]. [name], MD  ASSISTANT:  [name]. [name], PA-C  ANESTHESIA:  General.  COMPLICATIONS:  None.  COMPONENTS:  DePuy AML small stature 12 mm femoral stem, a 52 mm outer diameter Gription metallic acetabular component with a single 25 mm long 6.5 mm diameter titanium acetabular screw, a +4 polyethylene acetabular liner, and a 36 mm odd diameter hip ball with a 12 mm neck length.  The components were press-fit.  PROCEDURE:  Mr. Rijo was met in the holding area, identified his left hip was the appropriate operative site.  He was then transported to room #4 and placed under general anesthesia without difficulty.  The nursing staff inserted a Foley catheter.  Urine was clear.  The patient was then placed in the lateral decubitus position with the left side up and secured to the operating room table with the Innomed hip system.  The left hip was then prepped with Betadine scrub and then DuraPrep from iliac crest to well below the knee.  Sterile draping was performed.  Time-out was called.  A routine Southern incision was utilized via sharp dissection and carried down to subcutaneous tissue.  Gross bleeders were Bovie coagulated.  Adipose tissue was incised with the Bovie.  Self-retaining retractors were placed more deeply.  The iliotibial band was identified and incised along length of the  skin incision.  East-West retractor was inserted.  I could palpate the sciatic nerve and was well out of the operative site.  Short external rotators were identified.  They were carefully incised using the Bovie from the posterior attachment of the greater trochanter. Tendinous structures were tagged with 0 Ethibond suture.  Capsule was identified and incised along the femoral neck and head to the level of the acetabulum.  There was a serosanguineous effusion.  The head was then easily dislocated posteriorly.  It was completely flat consistent with avascular necrosis of all of the head.  There were large flakes of articular cartilage still remaining on the head and also loose within the joint.  These were irrigated.  Using the calcar guide, the femoral head was then osteotomized from the femoral neck.  A starter hole was then made in the piriformis fossa and reaming was performed to 11.5 mm to accept a 12 mm femoral component.  I thought the neck was a little bit long at that point, so with the rasp in place, I osteotomized the calcar and then re-reamed and rasped the canal until we had excellent position of femoral component on the calcar.  Retractor was then placed about the acetabulum.  The very thick capsule with synovitis was then sharply excised.  The acetabulum was very shallow.  Reaming was performed to 51 mm outer diameter.  I trialed the 50 and 52, felt that the 52 outer diameter component was in excellent position.  I then inserted the Gription 52 mm outer diameter acetabular component.  I reinserted the femoral rasp and an 8.5 mm neck length head with a 36 mm ball and obtained an x-ray and I thought that the acetabulum was still shallow.  So I removed the trial components and re- reamed the acetabulum, reinserted the Gription acetabular component as well as the trial femur and repeated the films and at that point I had excellent position and depth of the acetabulum.  So  the trial components were removed.  I inserted the apex hole eliminator and then elected to use a single acetabular screw.  I reamed to 25 mm, then inserted the 65 diameter titanium screw and screwed it in flush. With the depth gauge, I could feel I had bone throughout the reamed area.  Marathon polyethylene liner was then carefully impacted and felt to be seated very nicely.  The wound was irrigated throughout the procedure and again at the end of that insertion of the acetabulum.  We then carefully inserted the final 12 mm small stature femoral component and it fits nicely on the calcar and was nice and tight.  We had trialed several neck lengths and felt that the +12 gave [place] the most stability and reestablished leg lengths.  Because of the shallow acetabulum and because of reaming, we felt that we needed a longer neck. With a 12 mm neck, we had perfect stability in all planes.  The final 36 mm outer diameter 12 mm neck length hip ball was then inserted and then reduced.  Again through a full range of motion, there was no toggling and no instability.  Wound was again irrigated with saline solution.  The capsule was closed with interrupted #1 Ethibond. Short external rotators were closed with similar material.  Wound was again irrigated with saline solution.  The iliotibial band was closed with a running #1 Vicryl, subcu with 2-0 Vicryl, and 3-0 Monocryl.  Skin closed with skin clips.  Sterile bulky dressing was applied followed by a knee immobilizer.  The patient was then placed in the supine position, awoken, and then returned to the postanesthesia recovery room in satisfactory condition.     [name]. [name], M.D.     PWW/MEDQ  D:  06/30/2011  T:  06/30/2011  Job:  [id number]

## 2011-06-30 NOTE — Anesthesia Postprocedure Evaluation (Signed)
  Anesthesia Post-op Note  Patient: Theodore Stevenson  Procedure(s) Performed: Procedure(s) (LRB): TOTAL HIP ARTHROPLASTY (Left)  Patient Location: PACU  Anesthesia Type: General  Level of Consciousness: awake  Airway and Oxygen Therapy: Patient Spontanous Breathing  Post-op Pain: moderate  Post-op Assessment: Post-op Vital signs reviewed  Post-op Vital Signs: stable  Complications: No apparent anesthesia complications

## 2011-06-30 NOTE — Plan of Care (Signed)
Problem: Consults Goal: Diagnosis- Total Joint Replacement Primary Total Hip Left     

## 2011-06-30 NOTE — Anesthesia Preprocedure Evaluation (Signed)
Anesthesia Evaluation  Patient identified by MRN, date of birth, ID band Patient awake    Reviewed: Allergy & Precautions, H&P , NPO status , Patient's Chart, lab work & pertinent test results  History of Anesthesia Complications Negative for: history of anesthetic complications  Airway Mallampati: I  Neck ROM: Full    Dental  (+) Teeth Intact and Dental Advisory Given   Pulmonary neg pulmonary ROS,  breath sounds clear to auscultation        Cardiovascular hypertension, Rhythm:Regular Rate:Normal     Neuro/Psych    GI/Hepatic (+) Hepatitis -  Endo/Other    Renal/GU      Musculoskeletal   Abdominal (+) + obese,   Peds  Hematology   Anesthesia Other Findings   Reproductive/Obstetrics                           Anesthesia Physical Anesthesia Plan  ASA: II  Anesthesia Plan: General   Post-op Pain Management:    Induction: Intravenous  Airway Management Planned: Oral ETT  Additional Equipment:   Intra-op Plan:   Post-operative Plan: Extubation in OR  Informed Consent: I have reviewed the patients History and Physical, chart, labs and discussed the procedure including the risks, benefits and alternatives for the proposed anesthesia with the patient or authorized representative who has indicated his/her understanding and acceptance.   Dental advisory given  Plan Discussed with: CRNA and Surgeon  Anesthesia Plan Comments:         Anesthesia Quick Evaluation

## 2011-06-30 NOTE — Preoperative (Signed)
Beta Blockers   Reason not to administer Beta Blockers:Not Applicable 

## 2011-07-01 ENCOUNTER — Encounter (HOSPITAL_COMMUNITY): Payer: Self-pay | Admitting: Orthopaedic Surgery

## 2011-07-01 LAB — BASIC METABOLIC PANEL
BUN: 14 mg/dL (ref 6–23)
CO2: 27 mEq/L (ref 19–32)
Calcium: 8.6 mg/dL (ref 8.4–10.5)
Chloride: 100 mEq/L (ref 96–112)
Creatinine, Ser: 1.15 mg/dL (ref 0.50–1.35)
GFR calc Af Amer: 79 mL/min — ABNORMAL LOW (ref >90)
GFR calc non Af Amer: 68 mL/min — ABNORMAL LOW (ref >90)
Glucose, Bld: 126 mg/dL — ABNORMAL HIGH (ref 70–99)
Potassium: 4.8 mEq/L (ref 3.5–5.1)
Sodium: 136 mEq/L (ref 135–145)

## 2011-07-01 LAB — CBC
HCT: 34.8 % — ABNORMAL LOW (ref 39.0–52.0)
Hemoglobin: 11.8 g/dL — ABNORMAL LOW (ref 13.0–17.0)
MCH: 30.5 pg (ref 26.0–34.0)
MCHC: 33.9 g/dL (ref 30.0–36.0)
MCV: 89.9 fL (ref 78.0–100.0)
Platelets: 198 10*3/uL (ref 150–400)
RBC: 3.87 MIL/uL — ABNORMAL LOW (ref 4.22–5.81)
RDW: 12.8 % (ref 11.5–15.5)
WBC: 7.4 10*3/uL (ref 4.0–10.5)

## 2011-07-01 MED ORDER — LIDOCAINE HCL (PF) 1 % IJ SOLN
INTRAMUSCULAR | Status: AC
  Filled 2011-07-01: qty 10

## 2011-07-01 MED ORDER — LORATADINE 10 MG PO TABS
10.0000 mg | ORAL_TABLET | Freq: Every day | ORAL | Status: DC
  Administered 2011-07-01 – 2011-07-02 (×2): 10 mg via ORAL
  Filled 2011-07-01 (×2): qty 1

## 2011-07-01 MED ORDER — ACETAMINOPHEN 10 MG/ML IV SOLN
1000.0000 mg | Freq: Four times a day (QID) | INTRAVENOUS | Status: DC
  Administered 2011-07-01 – 2011-07-02 (×2): 1000 mg via INTRAVENOUS
  Filled 2011-07-01 (×4): qty 100

## 2011-07-01 NOTE — Evaluation (Signed)
Physical Therapy Evaluation Patient Details Name: Theodore Stevenson MRN: [id number] DOB: 20-Jul-1955 Today's Date: 07/01/2011 Time: 1027-2536 PT Time Calculation (min): 31 min  PT Assessment / Plan / Recommendation Clinical Impression  Patient s/p L THA presenting with minimal L hip pain. Anticipate patient to be safe for d/c home tomorrow with 24/7 assist provided by spouse, HHPT, and RW.    PT Assessment  Patient needs continued PT services    Follow Up Recommendations  Home health PT;Supervision/Assistance - 24 hour    Barriers to Discharge        lEquipment Recommendations  None recommended by PT    Recommendations for Other Services     Frequency 7X/week    Precautions / Restrictions Precautions Precautions: Posterior Hip Required Braces or Orthoses: Knee Immobilizer - Left Knee Immobilizer - Left:  (wear in bed) Restrictions Weight Bearing Restrictions: Yes LLE Weight Bearing: Weight bearing as tolerated   Pertinent Vitals/Pain 1/10 L hip pain      Mobility  Bed Mobility Bed Mobility: Supine to Sit Supine to Sit: 5: Supervision;HOB flat Details for Bed Mobility Assistance: v/c's for technique Transfers Transfers: Sit to Stand;Stand to Sit Sit to Stand: 4: Min guard;With upper extremity assist;From bed Stand to Sit: 4: Min guard;Without upper extremity assist;To chair/3-in-1 Details for Transfer Assistance: v/c's for technique and to adhere to post hip prec Ambulation/Gait Ambulation/Gait Assistance: 4: Min guard Ambulation Distance (Feet): 100 Feet Assistive device: Rolling walker Ambulation/Gait Assistance Details: v/c's for sequencing, slow down cadence, increase L LE WBing, decrease bilat UE WBing. v/c's to adhere to post hip prec Gait Pattern: Step-through pattern;Decreased step length - left;Decreased stance time - left;Antalgic Gait velocity: v/c's to slow down Stairs: No    Exercises Total Joint Exercises Ankle Circles/Pumps: AROM;Both;10  reps;Supine Quad Sets: AROM;Left;10 reps;Supine Gluteal Sets: AROM;Both;10 reps;Supine Heel Slides: AROM;Left;10 reps;Supine   PT Diagnosis: Difficulty walking;Abnormality of gait;Generalized weakness;Acute pain  PT Problem List: Decreased strength;Decreased range of motion;Decreased activity tolerance;Decreased balance;Decreased mobility PT Treatment Interventions: DME instruction;Gait training;Stair training;Functional mobility training;Therapeutic activities;Therapeutic exercise   PT Goals Acute Rehab PT Goals PT Goal Formulation: With patient Time For Goal Achievement: 07/08/11 Potential to Achieve Goals: Good Pt will go Supine/Side to Sit: Independently;with HOB 0 degrees (while adhereing to post hip prec on L LE>) PT Goal: Supine/Side to Sit - Progress: Goal set today Pt will go Sit to Supine/Side: Independently;with HOB 0 degrees (while adhereing to L post hip prec.) PT Goal: Sit to Supine/Side - Progress: Goal set today Pt will go Sit to Stand: Independently;with upper extremity assist (up to RW while adhering to L post hip prec) PT Goal: Sit to Stand - Progress: Goal set today Pt will go Stand to Sit: Independently;with upper extremity assist (while adhering to L post hip prec.) PT Goal: Stand to Sit - Progress: Goal set today Pt will Ambulate: >150 feet;with modified independence;with rolling walker PT Goal: Ambulate - Progress: Goal set today Pt will Go Up / Down Stairs: 1-2 stairs;with min assist;with rolling walker (utilizing backwards technique.) PT Goal: Up/Down Stairs - Progress: Goal set today Pt will Perform Home Exercise Program: Independently PT Goal: Perform Home Exercise Program - Progress: Goal set today Additional Goals Additional Goal #1: Patient able to recall 3/3 post hip prec and be 100% compliant with them on L LE. PT Goal: Additional Goal #1 - Progress: Goal set today  Visit Information  Last PT Received On: 07/01/11 Assistance Needed: +1     Subjective Data  Subjective: Pt  received supine in bed. Pt denies pain at this time.   Prior Functioning  Home Living Lives With: Spouse Available Help at Discharge: Available 24 hours/day;Family Type of Home: House Home Access: Stairs to enter [employer] of Steps: 2 Entrance Stairs-Rails: None Home Layout: Able to live on main level with bedroom/bathroom Bathroom Shower/Tub: Engineer, manufacturing systems: Standard Bathroom Accessibility: Yes How Accessible: Accessible via walker Home Adaptive Equipment: Bedside commode/3-in-1;Straight cane;Walker - rolling Prior Function Level of Independence: Independent with assistive device(s) (used RW prior to surgery due to increased L hip pain) Able to Take Stairs?: Yes Driving: Yes (until 1 month ago) Vocation: Retired Musician: No difficulties Dominant Hand: Right    Cognition  Overall Cognitive Status: Appears within functional limits for tasks assessed/performed Arousal/Alertness: Awake/alert Orientation Level: Oriented X4 / Intact Behavior During Session: WFL for tasks performed    Extremity/Trunk Assessment Right Upper Extremity Assessment RUE ROM/Strength/Tone: Within functional levels Left Upper Extremity Assessment LUE ROM/Strength/Tone: Within functional levels Right Lower Extremity Assessment RLE ROM/Strength/Tone: Within functional levels Left Lower Extremity Assessment LLE ROM/Strength/Tone: Deficits;Due to precautions LLE ROM/Strength/Tone Deficits: hip flex <90, pt I'ly able to manage L LE Trunk Assessment Trunk Assessment: Normal   Balance    End of Session PT - End of Session Equipment Utilized During Treatment: Gait belt Activity Tolerance: Patient tolerated treatment well Patient left: in chair;with call bell/phone within reach;with family/visitor present Nurse Communication: Mobility status   Marcene Brawn 07/01/2011, 9:22 AM  Lewis Shock, PT, DPT Pager #:  [phone] Office #: [phone]

## 2011-07-01 NOTE — Progress Notes (Signed)
Physical Therapy Treatment Note   07/01/11 1119  PT Visit Information  Last PT Received On 07/01/11  Assistance Needed +1  PT Time Calculation  PT Start Time 1119  PT Stop Time 1150  PT Time Calculation (min) 31 min  Subjective Data  Subjective Pt received sitting up in chair with report "I'm ready to go home."  Precautions  Precautions Posterior Hip  Precaution Comments pt able to recall 3/3 post hip prec  Restrictions  LLE Weight Bearing WBAT  Cognition  Overall Cognitive Status Appears within functional limits for tasks assessed/performed  Arousal/Alertness Awake/alert  Orientation Level Oriented X4 / Intact  Behavior During Session [facility] for tasks performed  Bed Mobility  Details for Bed Mobility Assistance discussed sit --> supine transfer to adhere to L post hip prec  Transfers  Transfers Sit to Stand;Stand to Sit  Sit to Stand 6: Modified independent (Device/Increase time);With upper extremity assist;From chair/3-in-1  Stand to Sit 5: Supervision;With upper extremity assist;To bed  Details for Transfer Assistance pt demo'd good technique  Ambulation/Gait  Ambulation/Gait Assistance 5: Supervision  Ambulation Distance (Feet) 150 Feet  Assistive device Rolling walker  Ambulation/Gait Assistance Details improved L LE WBing, decreased bilat UE WBing  Gait Pattern Step-through pattern;Decreased step length - left;Decreased stance time - left;Antalgic  Gait velocity improved cadence  Stairs Yes  Stairs Assistance 4: Min assist  Stairs Assistance Details (indicate cue type and reason) minA for walker management, wife and pt with good return demonstration  Stair Management Technique No rails;Backwards;With walker  Number of Stairs 2   PT - End of Session  Equipment Utilized During Treatment Gait belt  Activity Tolerance Patient tolerated treatment well  Patient left in chair;with call bell/phone within reach;with family/visitor present  Nurse Communication Mobility status    PT - Assessment/Plan  Comments on Treatment Session Pt able to recall 3/3 precautions and demo'd safe transfers and ambulation. Wife and patient safe to complete stair negotiation to enter home. patient safe to d/c today if MD approves. Patient with DME, 24/7 assist, and good home set up. Patient to receive HHPT to maximize functional recovery to transition to I function.  PT Plan Discharge plan remains appropriate;Frequency remains appropriate  PT Frequency 7X/week  Follow Up Recommendations Home health PT;Supervision/Assistance - 24 hour  Equipment Recommended None recommended by PT  Acute Rehab PT Goals  PT Goal: Sit to Stand - Progress Progressing toward goal  PT Goal: Stand to Sit - Progress Progressing toward goal  PT Goal: Ambulate - Progress Progressing toward goal  PT Goal: Up/Down Stairs - Progress Progressing toward goal  PT Goal: Perform Home Exercise Program - Progress Progressing toward goal  Additional Goals  PT Goal: Additional Goal #1 - Progress Met  PT General Charges  $$ ACUTE PT VISIT 1 Procedure  PT Treatments  $Gait Training 23-37 mins    Pain: minimal L hip pain, c/o buttock pain from sitting in chair  Lewis Shock, PT, DPT Pager #: [phone] Office #: [phone]

## 2011-07-01 NOTE — Progress Notes (Signed)
Clinical Social Work  CSW received referral for SNF. CSW reviewed chart which stated PT recommended HH. CM consult was previously requested by MD for [facility] needs. CSW is signing off but available if needed.  [place], [place] [phone] (Coverage for Lovette Cliche)

## 2011-07-01 NOTE — Progress Notes (Signed)
Patient ID: Theodore Stevenson, male   DOB: 1955/07/01, 56 y.o.   MRN: [id number] PATIENT ID: Theodore Stevenson        MRN:  [id number]          DOB/AGE: December 26, 1955 / 56 y.o.  Theodore Campbell, MD   Theodore Code, PA-C [address] [place], [place], [place]  [address]                             [phone]   PROGRESS NOTE  Subjective:  negative for Chest Pain  negative for Shortness of Breath  negative for Nausea/Vomiting   negative for Calf Pain  negative for Bowel Movement   Tolerating Diet: yes         Patient reports pain as mild.     Minimal pain.  Very comfortable. Denies numbness.  Objective: Vital signs in last 24 hours:   Patient Vitals for the past 24 hrs:  BP Temp Temp src Pulse Resp SpO2  07/01/11 0651 - 100.4 F (38 C) Oral - - -  07/01/11 0533 129/79 mmHg 101.1 F (38.4 C) Oral 108  18  94 %  06/30/11 2100 155/75 mmHg 98.7 F (37.1 C) - 83  18  98 %  06/30/11 1600 - - - - 18  -  06/30/11 1453 - - - 69  12  99 %  06/30/11 1452 - - - 68  12  100 %  06/30/11 1451 - - - 70  13  99 %  06/30/11 1450 - - - 69  14  99 %  06/30/11 1449 - - - 67  12  99 %  06/30/11 1448 - - - 69  12  99 %  06/30/11 1447 - - - 70  13  100 %  06/30/11 1446 - - - 68  12  99 %  06/30/11 1445 - 98 F (36.7 C) - 68  12  99 %  06/30/11 1444 - - - 67  12  99 %  06/30/11 1443 - - - 68  15  99 %  06/30/11 1442 - - - 67  12  99 %  06/30/11 1441 - - - 67  12  100 %  06/30/11 1440 - - - 67  12  100 %  06/30/11 1439 - - - 67  11  100 %  06/30/11 1438 172/71 mmHg - - 68  11  99 %  06/30/11 1437 - - - 69  13  99 %  06/30/11 1436 - - - 68  11  99 %  06/30/11 1435 - - - 68  11  99 %  06/30/11 1434 - - - 67  11  98 %  06/30/11 1433 185/77 mmHg - - 67  12  98 %  06/30/11 1432 - - - 66  11  99 %  06/30/11 1431 - - - 69  17  99 %  06/30/11 1430 - - - 66  12  96 %  06/30/11 1429 - - - 65  14  98 %  06/30/11 1428 - - - 65  13  98 %  06/30/11 1427 - - - 65  12  97 %  06/30/11 1426 - - - 65  12  97  %  06/30/11 1425 - - - 68  13  98 %  06/30/11 1424 - - - 73  12  98 %  06/30/11  1423 138/77 mmHg - - 80  12  99 %  06/30/11 1422 - - - 82  12  100 %  06/30/11 1421 - - - 87  18  100 %  06/30/11 1420 - - - 87  14  100 %  06/30/11 1419 - - - 91  17  100 %  06/30/11 1418 - - - 87  15  100 %  06/30/11 1417 - - - 89  15  100 %  06/30/11 1416 - - - 90  14  100 %  06/30/11 1415 138/77 mmHg - - 88  15  100 %  06/30/11 1414 - - - 86  15  100 %  06/30/11 1413 - - - 87  12  100 %  06/30/11 1412 - - - 89  14  100 %  06/30/11 1411 - - - 88  14  100 %  06/30/11 1410 - - - 83  12  100 %  06/30/11 1409 - - - 81  15  100 %  06/30/11 1408 192/98 mmHg - - 83  14  100 %  06/30/11 1407 - - - 81  16  100 %  06/30/11 1406 - - - 78  12  100 %  06/30/11 1405 - - - 76  13  100 %  06/30/11 1404 - - - 78  12  100 %  06/30/11 1403 - - - 82  13  100 %  06/30/11 1402 - - - 87  15  100 %  06/30/11 1401 - - - 89  20  100 %  06/30/11 1400 191/93 mmHg - - 87  19  100 %  06/30/11 1359 - - - 86  14  100 %  06/30/11 1358 - - - 84  16  100 %  06/30/11 1357 - - - 83  15  100 %  06/30/11 1356 - - - 84  12  100 %  06/30/11 1355 - - - 84  15  100 %  06/30/11 1354 - - - 83  15  100 %  06/30/11 1353 191/93 mmHg - - 82  14  100 %  06/30/11 1352 - - - 83  17  100 %  06/30/11 1351 - - - 82  16  100 %  06/30/11 1350 - - - 83  14  100 %  06/30/11 1349 - - - 86  14  100 %  06/30/11 1348 - - - 86  17  100 %  06/30/11 1347 - - - 87  14  100 %  06/30/11 1346 - - - 87  17  100 %  06/30/11 1345 - - - 88  16  100 %  06/30/11 1344 - - - 87  18  100 %  06/30/11 1343 - - - 87  22  100 %  06/30/11 1342 - - - 87  15  99 %  06/30/11 1341 - - - 87  16  100 %  06/30/11 1340 - - - 87  16  100 %  06/30/11 1339 - - - 86  14  99 %  06/30/11 1338 172/98 mmHg - - - - -  06/30/11 1330 172/98 mmHg 97.9 F (36.6 C) - 81  16  100 %  06/30/11 0911 134/87 mmHg 98.5 F (36.9 C) Oral 101  18  99 %      Intake/Output from previous day:    06/18 0701 -  06/19 0700 In: 3475 [P.O.:600; I.V.:2875] Out: 900 [Urine:400]   Intake/Output this shift:   06/19 0701 - 06/19 1900 In: 1000 [I.V.:1000] Out: -    Intake/Output      06/18 0701 - 06/19 0700 06/19 0701 - 06/20 0700   P.O. 600    I.V. 2875 1000   Total Intake 3475 1000   Urine 400    Blood 500    Total Output 900    Net +2575 +1000           LABORATORY DATA:  Basename 07/01/11 0550 06/24/11 1048  WBC 7.4 7.5  HGB 11.8* 14.9  HCT 34.8* 44.9  PLT 198 231    Basename 07/01/11 0550 06/24/11 1048  NA 136 138  K 4.8 4.3  CL 100 99  CO2 27 28  BUN 14 28*  CREATININE 1.15 1.07  GLUCOSE 126* 127*  CALCIUM 8.6 9.7   Lab Results  Component Value Date   INR 0.98 06/24/2011   INR 0.97 09/01/2010    Examination:  General appearance: alert, cooperative and mild distress Resp: clear to auscultation bilaterally Cardio: regular rate and rhythm GI: normal findings: bowel sounds normal  Wound Exam: clean, dry, intact dressing  Drainage:  None: wound tissue dry  Motor Exam: EHL, FHL, Anterior Tibial and Posterior Tibial Intact  Sensory Exam: Superficial Peroneal, Deep Peroneal and Tibial normal  Vascular Exam: Left dorsalis pedis artery has 1+ (weak) pulse  Assessment:    1 Day Post-Op  Procedure(s) (LRB): TOTAL HIP ARTHROPLASTY (Left)  ADDITIONAL DIAGNOSIS:  Active Problems:  * No active hospital problems. *   Acute Blood Loss Anemia expected   Plan: Physical Therapy as ordered Weight Bearing as Tolerated (WBAT)  DVT Prophylaxis:  Xarelto, Foot Pumps and TED hose  DISCHARGE PLAN: Home  DISCHARGE NEEDS: HHPT, Walker and 3-in-1 comode seat         Amberlie Gaillard 07/01/2011, 8:24 AM

## 2011-07-01 NOTE — Progress Notes (Signed)
OT Screen Order received, chart reviewed. Spoke briefly with pt who had the other hip replaced in August. Pt states he has all necessary DME and AE and will have prn A at home. Pt presents with no OT needs at this time. Will sign off.  Garrel Ridgel, OTR/L  Pager [phone] 07/01/2011

## 2011-07-01 NOTE — Progress Notes (Signed)
UR COMPLETED  

## 2011-07-02 DIAGNOSIS — I10 Essential (primary) hypertension: Secondary | ICD-10-CM | POA: Diagnosis present

## 2011-07-02 DIAGNOSIS — K759 Inflammatory liver disease, unspecified: Secondary | ICD-10-CM | POA: Insufficient documentation

## 2011-07-02 DIAGNOSIS — M87059 Idiopathic aseptic necrosis of unspecified femur: Secondary | ICD-10-CM | POA: Diagnosis present

## 2011-07-02 DIAGNOSIS — M8700 Idiopathic aseptic necrosis of unspecified bone: Secondary | ICD-10-CM | POA: Diagnosis present

## 2011-07-02 DIAGNOSIS — D62 Acute posthemorrhagic anemia: Secondary | ICD-10-CM | POA: Diagnosis not present

## 2011-07-02 LAB — CBC
HCT: 30.2 % — ABNORMAL LOW (ref 39.0–52.0)
MCHC: 33.1 g/dL (ref 30.0–36.0)
Platelets: 175 10*3/uL (ref 150–400)
RDW: 12.9 % (ref 11.5–15.5)

## 2011-07-02 LAB — BASIC METABOLIC PANEL
BUN: 11 mg/dL (ref 6–23)
GFR calc Af Amer: >90 mL/min (ref >90)
GFR calc non Af Amer: 88 mL/min — ABNORMAL LOW (ref >90)
Potassium: 4 mEq/L (ref 3.5–5.1)
Sodium: 138 mEq/L (ref 135–145)

## 2011-07-02 MED ORDER — HYDROMORPHONE HCL 2 MG PO TABS: 2.0000 mg | ORAL_TABLET | ORAL | Status: AC | PRN

## 2011-07-02 MED ORDER — RIVAROXABAN 10 MG PO TABS: 10.0000 mg | ORAL_TABLET | Freq: Every day | ORAL | Status: DC

## 2011-07-02 MED ORDER — CYCLOBENZAPRINE HCL 10 MG PO TABS: 10.0000 mg | ORAL_TABLET | Freq: Three times a day (TID) | ORAL | Status: AC | PRN

## 2011-07-02 NOTE — Progress Notes (Signed)
Physical Therapy Treatment Note   07/02/11 1040  PT Visit Information  Last PT Received On 07/02/11  Assistance Needed +1  PT Time Calculation  PT Start Time 1040  PT Stop Time 1051  PT Time Calculation (min) 11 min  Subjective Data  Subjective Pt received supine in bed with report "I"m leaving today."  Precautions  Precautions Posterior Hip  Precaution Comments pt able to recall 3/3 post hip prec  Restrictions  LLE Weight Bearing WBAT  Cognition  Overall Cognitive Status Appears within functional limits for tasks assessed/performed  Arousal/Alertness Awake/alert  Orientation Level Oriented X4 / Intact  Behavior During Session [facility] for tasks performed  Bed Mobility  Bed Mobility Supine to Sit  Supine to Sit 6: Modified independent (Device/Increase time);HOB flat  Transfers  Transfers Sit to Stand;Stand to Sit  Sit to Stand 6: Modified independent (Device/Increase time);With upper extremity assist;From chair/3-in-1  Stand to Sit 6: Modified independent (Device/Increase time)  Ambulation/Gait  Ambulation/Gait Assistance 6: Modified independent (Device/Increase time)  Ambulation Distance (Feet) 200 Feet  Assistive device Rolling walker  Ambulation/Gait Assistance Details pt with more fluid gait pattern, remains to have antalgia. educated patient on importance of not limping to avoid over use injury from compensation  Gait Pattern Step-through pattern;Decreased step length - left;Decreased stance time - left;Antalgic  Stairs Yes  Stairs Assistance 4: Min assist  Stairs Assistance Details (indicate cue type and reason) minA for walker management only  Stair Management Technique No rails;Backwards;With walker, spouse present and return demo'd good technique with assisting patient with walker management  Number of Stairs 4   PT - End of Session  Equipment Utilized During Treatment Gait belt  Activity Tolerance Patient tolerated treatment well  Patient left in chair;with call  bell/phone within reach;with family/visitor present  Nurse Communication Mobility status  PT - Assessment/Plan  Comments on Treatment Session Pt safe to d/c home today with recommended DME, assit from spouse and HHPT.  PT Plan Discharge plan remains appropriate;Frequency remains appropriate  PT Frequency 7X/week  Follow Up Recommendations Home health PT;Supervision/Assistance - 24 hour  Equipment Recommended None recommended by PT  Acute Rehab PT Goals  PT Goal: Supine/Side to Sit - Progress Progressing toward goal  PT Goal: Sit to Stand - Progress Progressing toward goal  PT Goal: Stand to Sit - Progress Progressing toward goal  PT Goal: Ambulate - Progress Progressing toward goal  PT Goal: Up/Down Stairs - Progress Progressing toward goal  PT Goal: Perform Home Exercise Program - Progress Progressing toward goal  Additional Goals  PT Goal: Additional Goal #1 - Progress Met  PT General Charges  $$ ACUTE PT VISIT 1 Procedure  PT Treatments  $Gait Training 8-22 mins     Pain: minimal L hip pain  Lewis Shock, PT, DPT Pager #: [phone] Office #: [phone]

## 2011-07-02 NOTE — Discharge Summary (Signed)
[name], MD   [name], PA-C [address] [place], [place], [place]  [address]                             [phone]  PATIENT ID: Theodore Stevenson        MRN:  [id number]          DOB/AGE: 58-31-1957 / 58 y.o.    DISCHARGE SUMMARY  ADMISSION DATE:    06/30/2011 DISCHARGE DATE:   07/02/2011   ADMISSION DIAGNOSIS: osteoarthritis left hipt  ,AVASCULAR NECROSIS FEMORAL HEAD  DISCHARGE DIAGNOSIS:  osteoarthritis left hip  ,AVASCULAR NECROSIS FEMORAL HEAD  ADDITIONAL DIAGNOSIS: Principal Problem:  *Avascular necrosis of hip Active Problems:  Hypertension  Postoperative anemia due to acute blood loss  Past Medical History  Diagnosis Date  . Hypertension   . Arthritis   . Hepatitis     1973    PROCEDURE: Procedure(s): LEFT TOTAL HIP ARTHROPLASTY on 06/30/2011  CONSULTS:   NONE  HISTORY: Theodore Stevenson is a very pleasant 56 year old white male who is seen today for evaluation of his left hip. He has had problems with pain in the left hip dating back to earlier this year. He was initially seen in the office on the 20th of March 2013. At that time he started having a gradual onset of left hip and knee pain which started without any history of injury or trauma. He was to the point that he was having more groin pain and anterior thigh pain at that time. He was having this moderate stabbing pain and it had not really improved at all. He had taken a couple of Aleve but nothing had been helping him. He states that it was very similar to what he had in his right hip prior to total hip replacement. He denied any neurovascular compromise at that time. He did have a corticosteroid injection on the 25th of March 2013 as well as April 22, 2011. He has most recently had injections for lumbosacral complaints but at his last injection on May 19, 2011 he was noted to have some change in the femoral head. He returned to the office on Jun 10, 2011 in rather excruciating pain and discomfort. His last  injection on May 29th was of no benefit and it actually started to worsen to the point where he is using crutches. He now is in constant severe throbbing and aching pain especially in his groin. He is also having some anterior thigh pain. Denies any numbness or neurologic symptoms. The pain is aching and grabbing and relentless. He is unable to sleep. He is unable to ambulate. He is unable to do any of the activities of daily living. He was noted on x-rays of the 29th of May that he had complete collapse of his femoral head probably consistent with avascular necrosis. At that time he was given clearance forms for possible upcoming surgery and was treated as best we could with medications. He had smoked previously but he has been dipping tobacco and swallowing it also. He has had several courses of prednisone in the past.    HOSPITAL COURSE:  Theodore Stevenson is a 56 y.o. admitted on 06/30/2011 and found to have a diagnosis of osteoarthritis left hip.  After appropriate laboratory studies were obtained  they were taken to the operating room on 06/30/2011 and underwent Procedure(s): LEFT TOTAL HIP ARTHROPLASTY.   They were given perioperative antibiotics:  Anti-infectives  Start     Dose/Rate Route Frequency Ordered Stop   06/30/11 1600   ceFAZolin (ANCEF) IVPB 2 g/50 mL premix        2 g 100 mL/hr over 30 Minutes Intravenous Every 6 hours 06/30/11 1524 06/30/11 2133   06/29/11 1457   ceFAZolin (ANCEF) IVPB 2 g/50 mL premix        2 g 100 mL/hr over 30 Minutes Intravenous 60 min pre-op 06/29/11 1457 06/30/11 1037        .  Tolerated the procedure well.  Placed with a foley intraoperatively.  Given Ofirmev at induction and for 48 hours.    POD #1, allowed out of bed to a chair.  PT for ambulation and exercise program.  Foley D/C'd in morning.  IV saline locked.  O2 discontionued.  POD #2, continued PT and ambulation.  Has done very well and has been cleared by PT for discharge and that is  what he has requested. . The remainder of the hospital course was dedicated to ambulation and strengthening.   The patient was discharged on 2 Days Post-Op in  Stable condition.  Blood products given:none  DIAGNOSTIC STUDIES: Recent vital signs:  Patient Vitals for the past 24 hrs:  BP Temp Temp src Pulse Resp SpO2 Height Weight  07/02/11 0526 138/62 mmHg 98.9 F (37.2 C) Oral 92  18  99 % - -  07-30-2011 2035 140/48 mmHg 100.4 F (38 C) Oral 89  18  98 % - -  July 30, 2011 1629 - 99 F (37.2 C) Oral - - - - -  2011-07-30 1400 139/63 mmHg 100 F (37.8 C) Oral 104  18  97 % - -  2011/07/30 1300 - - - - - - 5\' 5"  (1.651 m) 80.74 kg (178 lb)       Recent laboratory studies:  Basename 07/02/11 0510 07-30-11 0550  WBC 7.7 7.4  HGB 10.0* 11.8*  HCT 30.2* 34.8*  PLT 175 198    Basename 07/02/11 0510 07-30-2011 0550  NA 138 136  K 4.0 4.8  CL 103 100  CO2 29 27  BUN 11 14  CREATININE 0.99 1.15  GLUCOSE 105* 126*  CALCIUM 8.7 8.6   Lab Results  Component Value Date   INR 0.98 06/24/2011   INR 0.97 09/01/2010     Recent Radiographic Studies :  Dg Hip Operative Left  06/30/2011  *RADIOLOGY REPORT*  Clinical Data: Left total hip arthroplasty.  OPERATIVE LEFT HIP 2 VIEW  Comparison: None.  Findings: Cross-table lateral image obtained during left hip arthroplasty demonstrates anatomic alignment.  A surgical needle is noted in the field, overlying the medial left upper thigh on the initial image, not visible on the second image.  IMPRESSION: Anatomic alignment of the left hip prosthesis on these images obtained during arthroplasty.  Original Report Authenticated By: Arnell Sieving, M.D.   Dg Pelvis Portable  06/30/2011  *RADIOLOGY REPORT*  Clinical Data: Post left total hip replacement.  PORTABLE PELVIS  Comparison: 09/09/2010  Findings: The changes of new left hip replacement.  Normal alignment.  No hardware or bony complicating feature.  The soft tissue the gas noted about the left hip.   Remote changes of right hip replacement.  No acute bony abnormality.  IMPRESSION: New changes of left hip replacement.  No complicating feature.  Original Report Authenticated By: Cyndie Chime, M.D.   Dg Hip Portable 1 View Right  06/30/2011  *RADIOLOGY REPORT*  Clinical Data: Post left total hip  replacement.  PORTABLE RIGHT HIP - 1 VIEW  Comparison: Pelvis film today.  Findings: Changes of left hip replacement.  Normal alignment.  No complicating feature.  IMPRESSION: Left hip replacement.  No complicating feature.  Original Report Authenticated By: Cyndie Chime, M.D.    DISCHARGE INSTRUCTIONS: Discharge Orders    Future Orders Please Complete By Expires   Diet general      Call MD / Call 911      Comments:   If you experience chest pain or shortness of breath, CALL 911 and be transported to the hospital emergency room.  If you develope a fever above 101 F, pus (white drainage) or increased drainage or redness at the wound, or calf pain, call your surgeon's office.   Constipation Prevention      Comments:   Drink plenty of fluids.  Prune juice may be helpful.  You may use a stool softener, such as Colace (over the counter) 100 mg twice a day.  Use MiraLax (over the counter) for constipation as needed.   Increase activity slowly as tolerated      Patient may shower      Comments:   You may shower without a dressing once there is no drainage.  Do not wash over the wound.  If drainage remains, cover wound with plastic wrap and then shower.   Weight bearing as tolerated      Scheduling Instructions:   AS TAUGHT IN PT   Driving restrictions      Comments:   No driving for 6 weeks   Lifting restrictions      Comments:   No lifting for 6 weeks   Follow the hip precautions as taught in Physical Therapy      Change dressing      Comments:   You may change your dressing on MONDAY, then change the dressing daily with sterile 4 x 4 inch gauze dressing and paper tape.  You may clean the  incision with alcohol prior to redressing   TED hose      Comments:   Use stockings (TED hose) for 3 weeks on operative leg(s).  You may remove them at night for sleeping.      DISCHARGE MEDICATIONS:   Medication List  As of 07/02/2011 10:19 AM   STOP taking these medications         naproxen sodium 220 MG tablet      traMADol 50 MG tablet         TAKE these medications         benazepril 40 MG tablet   Commonly known as: LOTENSIN   Take 40 mg by mouth daily.      cyclobenzaprine 10 MG tablet   Commonly known as: FLEXERIL   Take 1 tablet (10 mg total) by mouth 3 (three) times daily as needed for muscle spasms.      ezetimibe-simvastatin 10-40 MG per tablet   Commonly known as: VYTORIN   Take 1 tablet by mouth daily.      HYDROmorphone 2 MG tablet   Commonly known as: DILAUDID   Take 1-2 tablets (2-4 mg total) by mouth every 4 (four) hours as needed for pain.      rivaroxaban 10 MG Tabs tablet   Commonly known as: XARELTO   Take 1 tablet (10 mg total) by mouth daily.            FOLLOW UP VISIT:   Follow-up Information    Follow up  with Julene Rahn, PA on 07/15/2011.   Contact information:   [address] [place] [address] [phone]          DISPOSITION:  Home    CONDITION:  Stable   Theodore Stevenson 07/02/2011, 10:19 AM

## 2011-07-02 NOTE — Care Management Note (Signed)
    Page 1 of 1   07/02/2011     1:29:37 PM   CARE MANAGEMENT NOTE 07/02/2011  Patient:  Theodore Stevenson, Theodore Stevenson   Account Number:  [id number]  Date Initiated:  07/02/2011  Documentation initiated by:  [facility]  Subjective/Objective Assessment:   Admitted postop hip arthroplasty.     Action/Plan:   Anticipated DC Date:  07/02/2011   Anticipated DC Plan:  HOME W HOME HEALTH SERVICES      DC Planning Services  CM consult      Choice offered to / List presented to:          [facility] arranged  HH-2 PT      [facility] agency  [facility]   Status of service:  Completed, signed off Medicare Important Message given?   (If response is "NO", the following Medicare IM given date fields will be blank) Date Medicare IM given:   Date Additional Medicare IM given:    Discharge Disposition:  HOME W HOME HEALTH SERVICES  Per UR Regulation:  Reviewed for med. necessity/level of care/duration of stay  If discussed at Long Length of Stay Meetings, dates discussed:    Comments:  07/02/11 Patient already set up with [facility], contacted Debbie at [facility] and verified that they are set up with patient for HHPT and patient discharged today. Jacquelynn Cree RN, BSN, CCM

## 2011-07-02 NOTE — Progress Notes (Signed)
Patient ID: Theodore Stevenson, male   DOB: [birth year], 56 y.o.   MRN: [id number] PATIENT ID: Theodore Stevenson        MRN:  [id number]          DOB/AGE: 28-Jun-1955 / 56 y.o.  Theodore Campbell, MD   [name], PA-C [address] [place], [place], [place]  [address]                             [phone]   PROGRESS NOTE  Subjective:  negative for Chest Pain  negative for Shortness of Breath  negative for Nausea/Vomiting   negative for Calf Pain  negative for Bowel Movement   Tolerating Diet: yes         Patient reports pain as mild.       Objective: Vital signs in last 24 hours:   Patient Vitals for the past 24 hrs:  BP Temp Temp src Pulse Resp SpO2 Height Weight  07/02/11 0526 138/62 mmHg 98.9 F (37.2 C) Oral 92  18  99 % - -  07/01/11 2035 140/48 mmHg 100.4 F (38 C) Oral 89  18  98 % - -  07/01/11 1629 - 99 F (37.2 C) Oral - - - - -  07/01/11 1400 139/63 mmHg 100 F (37.8 C) Oral 104  18  97 % - -  07/01/11 1300 - - - - - - 5\' 5"  (1.651 m) 80.74 kg (178 lb)      Intake/Output from previous day:   06/19 0701 - 06/20 0700 In: 1390 [P.O.:390; I.V.:1000] Out: 2251 [Urine:2251]   Intake/Output this shift:       Intake/Output      06/19 0701 - 06/20 0700 06/20 0701 - 06/21 0700   P.O. 390    I.V. (mL/kg) 1000 (12.4)    Total Intake(mL/kg) 1390 (17.2)    Urine (mL/kg/hr) 2251 (1.2)    Blood     Total Output 2251    Net -861            LABORATORY DATA:  Basename 07/02/11 0510 07/01/11 0550  WBC 7.7 7.4  HGB 10.0* 11.8*  HCT 30.2* 34.8*  PLT 175 198    Basename 07/02/11 0510 07/01/11 0550  NA 138 136  K 4.0 4.8  CL 103 100  CO2 29 27  BUN 11 14  CREATININE 0.99 1.15  GLUCOSE 105* 126*  CALCIUM 8.7 8.6   Lab Results  Component Value Date   INR 0.98 06/24/2011   INR 0.97 09/01/2010    Examination:  General appearance: alert, cooperative and no distress  Wound Exam: clean, dry, intact   Drainage:  None: wound tissue dry  Motor Exam: EHL,  FHL, Anterior Tibial and Posterior Tibial Intact  Sensory Exam: Superficial Peroneal, Deep Peroneal and Tibial normal  Vascular Exam: Normal  Assessment:    2 Days Post-Op  Procedure(s) (LRB): TOTAL HIP ARTHROPLASTY (Left)  ADDITIONAL DIAGNOSIS:  Principal Problem:  *Avascular necrosis of hip Active Problems:  Hypertension  Postoperative anemia due to acute blood loss     Plan: Physical Therapy as ordered Partial Weight Bearing @ 50% (PWB)  DVT Prophylaxis:  Xarelto  DISCHARGE PLAN: Home  DISCHARGE NEEDS: has equipment at home    Excellent progress in PT-will D/C today     Nadirah Socorro W 07/02/2011, 10:11 AM

## 2012-05-01 IMAGING — CR DG CHEST 2V
1 series · 1 of 1 positions shown · non-contrast
Comparison: PA and lateral chest 09/01/2010.

CLINICAL DATA: Status post surgery 09/09/2010.

CHEST - 2 VIEW

[view not recorded]
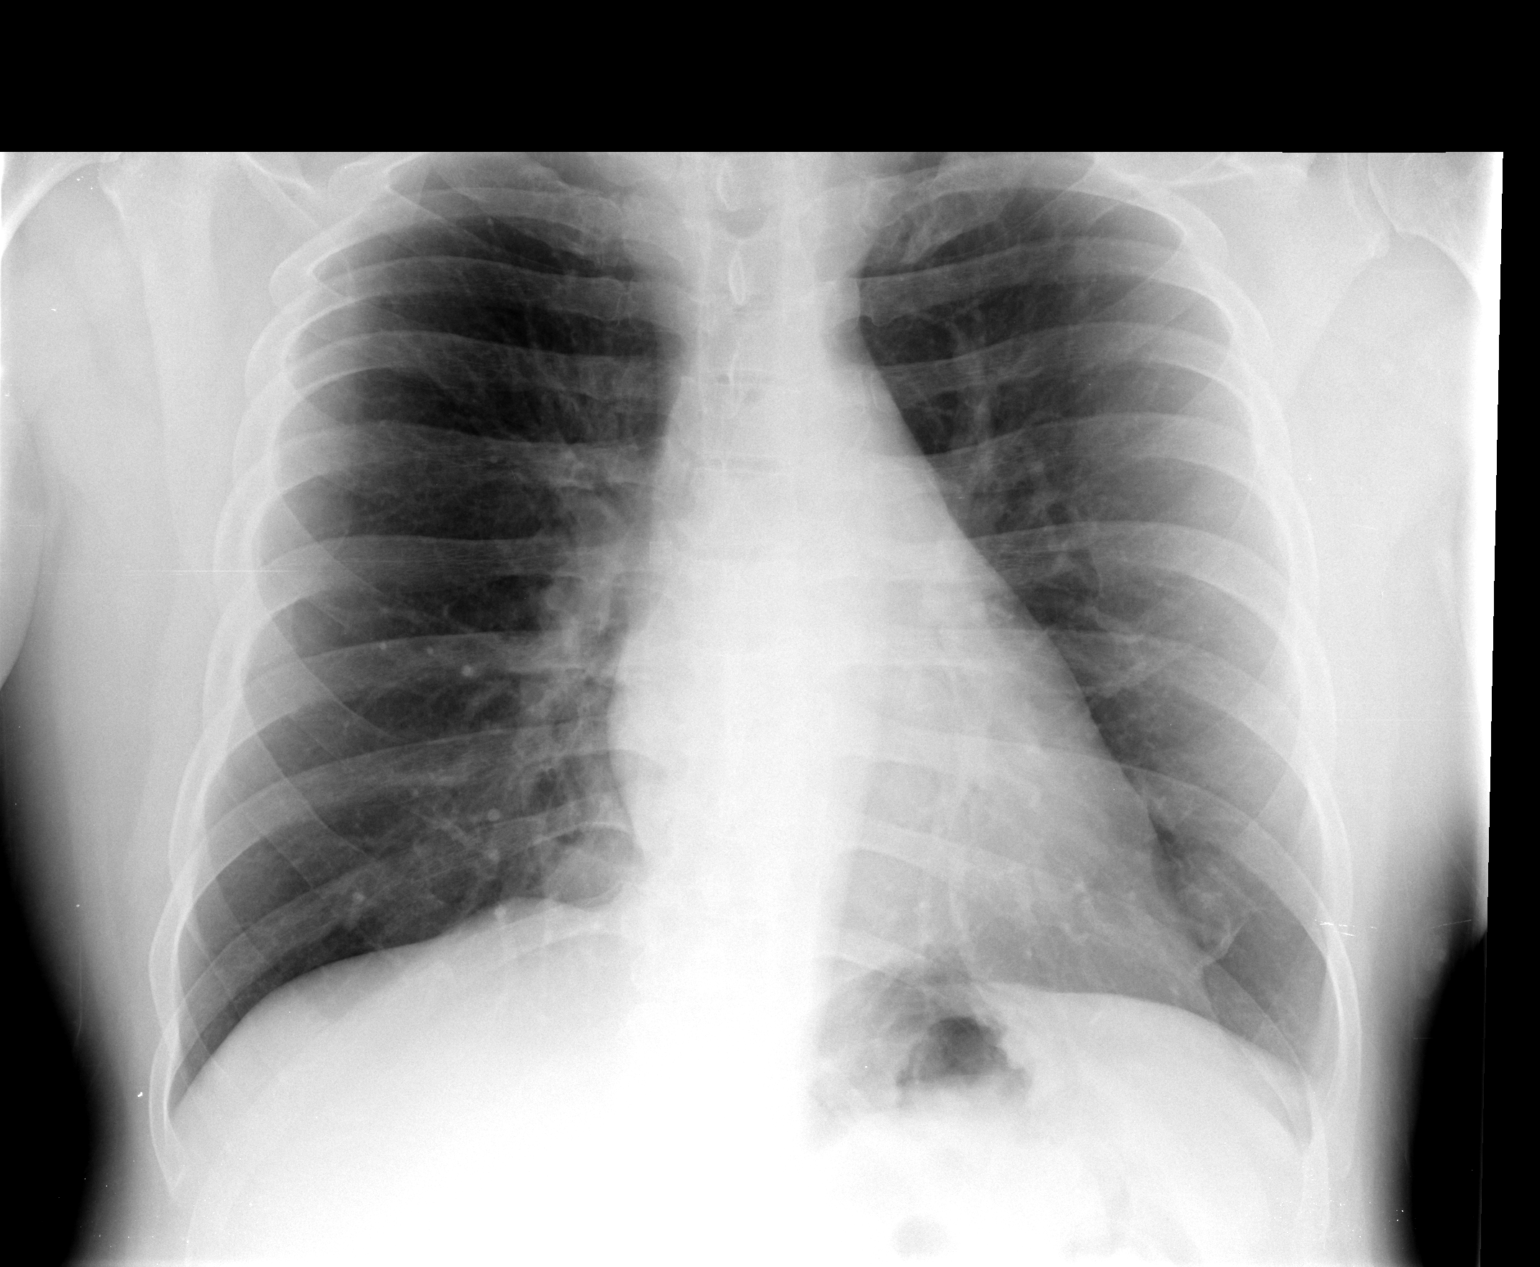

[1 of 1 positions shown; findings below may reference images not displayed]

FINDINGS: Lungs are clear.  Heart size is normal.  No pneumothorax
or pleural effusion.
IMPRESSION: No acute disease.

## 2016-02-24 ENCOUNTER — Other Ambulatory Visit (INDEPENDENT_AMBULATORY_CARE_PROVIDER_SITE_OTHER): Payer: Self-pay | Admitting: Orthopedic Surgery

## 2016-02-24 ENCOUNTER — Telehealth (INDEPENDENT_AMBULATORY_CARE_PROVIDER_SITE_OTHER): Payer: Self-pay | Admitting: Orthopaedic Surgery

## 2016-02-24 NOTE — Telephone Encounter (Signed)
Patient called requesting a refill on Vytorin. [phone]#[phone].  Thank you.

## 2016-02-25 NOTE — Telephone Encounter (Signed)
Please advise 

## 2016-02-25 NOTE — Telephone Encounter (Signed)
Medical doctor to prescribed

## 2016-02-25 NOTE — Telephone Encounter (Signed)
Called him and he meant Voltaren 75 mg.  It has been close to 9 months he said since visit. Told him to make appt if he wanted meds..his PCP retired

## 2016-04-14 ENCOUNTER — Other Ambulatory Visit (INDEPENDENT_AMBULATORY_CARE_PROVIDER_SITE_OTHER): Payer: Self-pay | Admitting: Orthopedic Surgery

## 2016-04-14 MED ORDER — DICLOFENAC SODIUM 50 MG PO TBEC: 50.0000 mg | DELAYED_RELEASE_TABLET | Freq: Two times a day (BID) | ORAL | 4 refills | Status: DC

## 2016-04-16 ENCOUNTER — Telehealth (INDEPENDENT_AMBULATORY_CARE_PROVIDER_SITE_OTHER): Payer: Self-pay | Admitting: Orthopedic Surgery

## 2016-04-16 ENCOUNTER — Other Ambulatory Visit (INDEPENDENT_AMBULATORY_CARE_PROVIDER_SITE_OTHER): Payer: Self-pay

## 2016-04-16 NOTE — Telephone Encounter (Signed)
Left VM for pharmacy for dicofenac 

## 2016-04-16 NOTE — Telephone Encounter (Signed)
Patient called stating that his pharmacy, [facility] on [address] does not have his prescription.  He wants to know if we can resend it.  CB#[phone].  Thank you.

## 2016-04-16 NOTE — Telephone Encounter (Signed)
Called pharmacy with Dicofenac 50 mg

## 2017-07-26 ENCOUNTER — Ambulatory Visit (INDEPENDENT_AMBULATORY_CARE_PROVIDER_SITE_OTHER): Payer: BLUE CROSS/BLUE SHIELD | Admitting: Orthopaedic Surgery

## 2017-07-26 ENCOUNTER — Encounter (INDEPENDENT_AMBULATORY_CARE_PROVIDER_SITE_OTHER): Payer: Self-pay | Admitting: Orthopaedic Surgery

## 2017-07-26 ENCOUNTER — Ambulatory Visit (INDEPENDENT_AMBULATORY_CARE_PROVIDER_SITE_OTHER): Payer: Self-pay

## 2017-07-26 ENCOUNTER — Other Ambulatory Visit (INDEPENDENT_AMBULATORY_CARE_PROVIDER_SITE_OTHER): Payer: Self-pay | Admitting: Radiology

## 2017-07-26 VITALS — BP 167/74 | HR 53 | Ht 65.0 in | Wt 185.0 lb

## 2017-07-26 DIAGNOSIS — G8929 Other chronic pain: Secondary | ICD-10-CM

## 2017-07-26 DIAGNOSIS — M4807 Spinal stenosis, lumbosacral region: Secondary | ICD-10-CM | POA: Diagnosis not present

## 2017-07-26 DIAGNOSIS — M5442 Lumbago with sciatica, left side: Secondary | ICD-10-CM | POA: Diagnosis not present

## 2017-07-26 MED ORDER — METHYLPREDNISOLONE 4 MG PO TABS: ORAL_TABLET | ORAL | 0 refills | Status: DC

## 2017-07-26 NOTE — Progress Notes (Signed)
Office Visit Note   Patient: Theodore Stevenson           Date of Birth: 03/30/1955           MRN: [id number] Visit Date: 07/26/2017              Requested by: No referring provider defined for this encounter. PCP: Patient, No Pcp Per   Assessment & Plan: Visit Diagnoses:  1. Chronic left-sided low back pain with left-sided sciatica   2. Spinal stenosis of lumbosacral region     Plan: Several month history of bilateral buttock and lower extremity pain that appears to originate from L5-S1.  Will obtain an MRI scan.  Follow-Up Instructions: Return after MRI L-S spine.   Orders:  Orders Placed This Encounter  Procedures  . XR Lumbar Spine 2-3 Views  . XR Pelvis 1-2 Views  . MR LUMBAR SPINE WO CONTRAST   No orders of the defined types were placed in this encounter.     Procedures: No procedures performed   Clinical Data: No additional findings.   Subjective: Chief Complaint  Patient presents with  . Follow-up    LOW BACK PAIN AND RADIATES DOWN BACK OF BOTH LEGS SINCE 03/2017 L LEG GOES NUMB AT TIMEX  Status post bilateral hip replacements that were staggered in 2012 in 2013.  No groin pain or hip discomfort.  Over the past several months has developed pain in both buttock with some referred pain in different different distributions to his right and left leg.  He has had some trouble when he stands for a length of time when he walks any distance.  No bowel or bladder changes.  HPI  Review of Systems  Constitutional: Negative for fatigue and fever.  HENT: Negative for ear pain.   Eyes: Negative for pain.  Respiratory: Negative for cough and shortness of breath.   Cardiovascular: Positive for leg swelling.  Gastrointestinal: Negative for constipation and diarrhea.  Genitourinary: Negative for difficulty urinating.  Musculoskeletal: Negative for back pain and neck pain.  Skin: Negative for rash.  Neurological: Positive for weakness and numbness.  Hematological:  Bruises/bleeds easily.  Psychiatric/Behavioral: Negative for sleep disturbance.     Objective: Vital Signs: BP (!) 167/74 (BP Location: Left Arm, Patient Position: Sitting, Cuff Size: Normal)   Pulse (!) 53   Ht 5\' 5"  (1.651 m)   Wt 185 lb (83.9 kg)   BMI 30.79 kg/m   Physical Exam  Constitutional: He is oriented to person, place, and time. He appears well-developed and well-nourished.  HENT:  Mouth/Throat: Oropharynx is clear and moist.  Eyes: Pupils are equal, round, and reactive to light. EOM are normal.  Pulmonary/Chest: Effort normal.  Neurological: He is alert and oriented to person, place, and time.  Skin: Skin is warm and dry.  Psychiatric: He has a normal mood and affect. His behavior is normal.    Ortho Exam awake alert and oriented x3.  Comfortable sitting.  Painless range of motion of both hips.  Slightly decreased range of motion on the right compared to the left.  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  No pain over either greater trochanter.  Some pain over the ischial tuberosities bilaterally.  No percussible back pain.  Straight leg raise negative and neurologically intact.  Leg lengths symmetrical  Specialty Comments:  No specialty comments available.  Imaging: Xr Lumbar Spine 2-3 Views  Result Date: 07/26/2017  3 views of the lumbar spine were obtained.  There is considerable degenerative disc disease at L5-S1 where he had prior surgery in  The 1980s.  Disc space is completely collapsed.  There is some narrowing of the disc space at L3-4 and L4-5.  No listhesis.  Some calcification of the abdominal aorta without obvious aneurysmal dilatation    PMFS History: Patient Active Problem List   Diagnosis Date Noted  . Avascular necrosis of hip (HCC) 07/02/2011  . Hypertension 07/02/2011  . Hepatitis 07/02/2011  . Postoperative anemia due to acute blood loss 07/02/2011   Past Medical  History:  Diagnosis Date  . Arthritis   . Hepatitis    1973  . Hypertension     History reviewed. No pertinent family history.  Past Surgical History:  Procedure Laterality Date  . BACK SURGERY     1987  . CARPAL TUNNEL RELEASE     right and left  . HIP SURGERY  06/30/2011   total left hip  . JOINT REPLACEMENT     right total hip  . TOTAL HIP ARTHROPLASTY  06/30/2011   Procedure: TOTAL HIP ARTHROPLASTY;  Surgeon: Valeria Batman, MD;  Location: [facility] OR;  Service: Orthopedics;  Laterality: Left;   Social History   Occupational History  . Not on file  Tobacco Use  . Smoking status: Former Smoker    Years: 10.00    Types: Cigarettes    Last attempt to quit: 06/22/2006    Years since quitting: 11.1  . Smokeless tobacco: Former Neurosurgeon    Types: Snuff, Chew  Substance and Sexual Activity  . Alcohol use: No  . Drug use: No  . Sexual activity: Yes

## 2017-07-29 ENCOUNTER — Ambulatory Visit
Admission: RE | Admit: 2017-07-29 | Discharge: 2017-07-29 | Disposition: A | Discharge status: Home / Self Care | Payer: BLUE CROSS/BLUE SHIELD | Source: Ambulatory Visit | Attending: Orthopaedic Surgery | Admitting: Orthopaedic Surgery

## 2017-07-29 ENCOUNTER — Other Ambulatory Visit: Payer: BLUE CROSS/BLUE SHIELD

## 2017-07-29 DIAGNOSIS — M4807 Spinal stenosis, lumbosacral region: Secondary | ICD-10-CM

## 2017-08-03 ENCOUNTER — Other Ambulatory Visit (INDEPENDENT_AMBULATORY_CARE_PROVIDER_SITE_OTHER): Payer: Self-pay | Admitting: Radiology

## 2017-08-03 ENCOUNTER — Telehealth (INDEPENDENT_AMBULATORY_CARE_PROVIDER_SITE_OTHER): Payer: Self-pay | Admitting: Orthopaedic Surgery

## 2017-08-03 DIAGNOSIS — G8929 Other chronic pain: Secondary | ICD-10-CM

## 2017-08-03 DIAGNOSIS — M545 Low back pain: Principal | ICD-10-CM

## 2017-08-03 NOTE — Telephone Encounter (Signed)
SENT REFERRAL INTO [facility] AND NOTIFIED PT. PT WOULD LIKE TO KNOW IF WE COULD SEND A REFERRAL IN FOR HIM TO SEE DAVID JONES AT [facility] AFTER HIS INJECTION AT [facility] PLEASE LET ME KNOW IF WE CAN SEND REFERRAL IN OR IF HE NEEDS TO BE SEEN FIRST

## 2017-08-03 NOTE — Telephone Encounter (Signed)
Patient has decided to proceed with back injections, and would like to get those scheduled ASAP. Please call patient if questions or concerns.

## 2017-08-03 NOTE — Telephone Encounter (Signed)
WHERE WOULD YOU LIKE TO SEND PT

## 2017-08-03 NOTE — Telephone Encounter (Signed)
Try [facility]

## 2017-08-04 ENCOUNTER — Other Ambulatory Visit (INDEPENDENT_AMBULATORY_CARE_PROVIDER_SITE_OTHER): Payer: Self-pay | Admitting: Radiology

## 2017-08-04 NOTE — Telephone Encounter (Signed)
Ok to send referral  

## 2017-08-05 ENCOUNTER — Other Ambulatory Visit (INDEPENDENT_AMBULATORY_CARE_PROVIDER_SITE_OTHER): Payer: Self-pay | Admitting: Radiology

## 2017-08-05 DIAGNOSIS — G8929 Other chronic pain: Secondary | ICD-10-CM

## 2017-08-05 DIAGNOSIS — M545 Low back pain: Principal | ICD-10-CM

## 2017-08-05 NOTE — Telephone Encounter (Signed)
[facility] to see Marikay Alaravid Jones

## 2017-08-16 ENCOUNTER — Ambulatory Visit
Admission: RE | Admit: 2017-08-16 | Discharge: 2017-08-16 | Disposition: A | Discharge status: Home / Self Care | Payer: BLUE CROSS/BLUE SHIELD | Source: Ambulatory Visit | Attending: Orthopaedic Surgery | Admitting: Orthopaedic Surgery

## 2017-08-16 DIAGNOSIS — M545 Low back pain: Principal | ICD-10-CM

## 2017-08-16 DIAGNOSIS — G8929 Other chronic pain: Secondary | ICD-10-CM

## 2017-08-16 MED ORDER — IOPAMIDOL (ISOVUE-M 200) INJECTION 41%
1.0000 mL | Freq: Once | INTRAMUSCULAR | Status: AC
  Administered 2017-08-16: 1 mL via EPIDURAL

## 2017-08-16 MED ORDER — METHYLPREDNISOLONE ACETATE 40 MG/ML INJ SUSP (RADIOLOG
120.0000 mg | Freq: Once | INTRAMUSCULAR | Status: AC
  Administered 2017-08-16: 120 mg via EPIDURAL

## 2017-08-16 NOTE — Discharge Instructions (Signed)

## 2017-08-30 ENCOUNTER — Ambulatory Visit (INDEPENDENT_AMBULATORY_CARE_PROVIDER_SITE_OTHER): Payer: BLUE CROSS/BLUE SHIELD | Admitting: Orthopaedic Surgery

## 2018-02-14 ENCOUNTER — Ambulatory Visit (INDEPENDENT_AMBULATORY_CARE_PROVIDER_SITE_OTHER): Payer: BLUE CROSS/BLUE SHIELD | Admitting: Orthopaedic Surgery

## 2018-02-14 ENCOUNTER — Encounter (INDEPENDENT_AMBULATORY_CARE_PROVIDER_SITE_OTHER): Payer: Self-pay | Admitting: Orthopaedic Surgery

## 2018-02-14 VITALS — BP 166/64 | HR 62 | Resp 16 | Ht 64.5 in | Wt 170.0 lb

## 2018-02-14 DIAGNOSIS — M5442 Lumbago with sciatica, left side: Secondary | ICD-10-CM

## 2018-02-14 DIAGNOSIS — G8929 Other chronic pain: Secondary | ICD-10-CM | POA: Diagnosis not present

## 2018-02-14 NOTE — Progress Notes (Signed)
Office Visit Note   Patient: Theodore Stevenson           Date of Birth: 12/28/1955           MRN: [id number] Visit Date: 02/14/2018              Requested by: No referring provider defined for this encounter. PCP: Patient, No Pcp Per   Assessment & Plan: Visit Diagnoses:  1. Chronic left-sided low back pain with left-sided sciatica     Plan: Repeat epidural steroid injections.  Had good relief last year after the initial injection.  Return in the next 4 to 6 weeks if no improvement  Follow-Up Instructions: Return if symptoms worsen or fail to improve.   Orders:  Orders Placed This Encounter  Procedures  . Epidural Steroid Injection - Lumbar/Sacral (Ancillary Performed)   No orders of the defined types were placed in this encounter.     Procedures: No procedures performed   Clinical Data: No additional findings.   Subjective: Chief Complaint  Patient presents with  . Lower Back - Pain  . Back Pain    Back pain  Theodore Stevenson is experiencing recurrent symptoms of back pain and left lower extremity radiculopathy.  He has had a prior MRI scan demonstrating severe spinal stenosis recess stenosis he is experiencing either L4 or L5 radiculitis.  He does have progressive degeneration at L3-4 and L1 to associated with spinal stenosis.  He had 1 epidural steroid injection last year that made a big difference.  Is experiencing recurrent symptoms of back and particularly left lower extremity pain when he is up and about and ambulating  HPI   Mr. Theodore Stevenson is a 63 year old male who is following up with low back pain after lumbar steroid injection at [facility] 08/16/2017, numbness in left leg, weakness, tingling, difficulty walking, difficulty sleeping at night, bil leg spasms, Aleve doesn't help, patient wants to discuss next plan of care.  Review of Systems  Constitutional: Negative for fatigue.  HENT: Negative for trouble swallowing.   Eyes: Negative for pain.  Respiratory:  Negative for shortness of breath.   Cardiovascular: Negative for leg swelling.  Gastrointestinal: Negative for constipation.  Endocrine: Negative for cold intolerance.  Genitourinary: Negative for difficulty urinating.  Musculoskeletal: Positive for back pain.  Skin: Negative for rash.  Allergic/Immunologic: Negative for food allergies.  Neurological: Positive for weakness and numbness.  Hematological: Does not bruise/bleed easily.  Psychiatric/Behavioral: Positive for sleep disturbance.     Objective: Vital Signs: BP (!) 166/64 (BP Location: Left Arm, Patient Position: Sitting, Cuff Size: Normal)   Pulse 62   Resp 16   Ht 5' 4.5" (1.638 m)   Wt 170 lb (77.1 kg)   BMI 28.73 kg/m   Physical Exam Constitutional:      Appearance: He is well-developed.  Eyes:     Pupils: Pupils are equal, round, and reactive to light.  Pulmonary:     Effort: Pulmonary effort is normal.  Skin:    General: Skin is warm and dry.  Neurological:     Mental Status: He is alert and oriented to person, place, and time.  Psychiatric:        Behavior: Behavior normal.     Ortho Exam walks without a limp.  Straight leg raise is negative.  Painless range of motion of his hips.  No knee pain neurologically appears to be intact. no percussible tenderness lumbar spine  Specialty Comments:  No specialty comments available.  Imaging: No results found.   PMFS History: Patient Active Problem List   Diagnosis Date Noted  . Chronic left-sided low back pain with left-sided sciatica 02/14/2018  . Avascular necrosis of hip (HCC) 07/02/2011  . Hypertension 07/02/2011  . Hepatitis 07/02/2011  . Postoperative anemia due to acute blood loss 07/02/2011   Past Medical History:  Diagnosis Date  . Arthritis   . Hepatitis    1973  . Hypertension   . Spinal stenosis     History reviewed. No pertinent family history.  Past Surgical History:  Procedure Laterality Date  . BACK SURGERY     1987  . CARPAL  TUNNEL RELEASE     right and left  . HIP SURGERY  06/30/2011   total left hip  . JOINT REPLACEMENT     right total hip  . TOTAL HIP ARTHROPLASTY  06/30/2011   Procedure: TOTAL HIP ARTHROPLASTY;  Surgeon: Valeria Batman, MD;  Location: [facility] OR;  Service: Orthopedics;  Laterality: Left;   Social History   Occupational History  . Not on file  Tobacco Use  . Smoking status: Former Smoker    Packs/day: 1.00    Years: 10.00    Pack years: 10.00    Types: Cigarettes    Last attempt to quit: 06/22/2006    Years since quitting: 11.6  . Smokeless tobacco: Current User    Types: Chew  Substance and Sexual Activity  . Alcohol use: No  . Drug use: No  . Sexual activity: Yes

## 2018-02-21 ENCOUNTER — Other Ambulatory Visit: Payer: BLUE CROSS/BLUE SHIELD

## 2018-02-24 ENCOUNTER — Ambulatory Visit
Admission: RE | Admit: 2018-02-24 | Discharge: 2018-02-24 | Disposition: A | Discharge status: Home / Self Care | Payer: BLUE CROSS/BLUE SHIELD | Source: Ambulatory Visit | Attending: Orthopaedic Surgery | Admitting: Orthopaedic Surgery

## 2018-02-24 DIAGNOSIS — G8929 Other chronic pain: Secondary | ICD-10-CM

## 2018-02-24 DIAGNOSIS — M5442 Lumbago with sciatica, left side: Principal | ICD-10-CM

## 2018-02-24 MED ORDER — METHYLPREDNISOLONE ACETATE 40 MG/ML INJ SUSP (RADIOLOG
120.0000 mg | Freq: Once | INTRAMUSCULAR | Status: AC
  Administered 2018-02-24: 120 mg via EPIDURAL

## 2018-02-24 MED ORDER — IOPAMIDOL (ISOVUE-M 200) INJECTION 41%
1.0000 mL | Freq: Once | INTRAMUSCULAR | Status: AC
  Administered 2018-02-24: 1 mL via EPIDURAL

## 2018-02-24 NOTE — Discharge Instructions (Signed)

## 2018-05-30 ENCOUNTER — Telehealth: Payer: Self-pay | Admitting: Orthopaedic Surgery

## 2018-05-30 NOTE — Telephone Encounter (Signed)
Please advise 

## 2018-05-30 NOTE — Telephone Encounter (Signed)
Patient called requesting a referral for epidural steroid injection for his back be sent to [facility].

## 2018-06-02 ENCOUNTER — Other Ambulatory Visit: Payer: Self-pay | Admitting: *Deleted

## 2018-06-02 DIAGNOSIS — M5442 Lumbago with sciatica, left side: Secondary | ICD-10-CM

## 2018-06-02 DIAGNOSIS — G8929 Other chronic pain: Secondary | ICD-10-CM

## 2018-06-02 NOTE — Telephone Encounter (Signed)
LMOM for Theodore Stevenson, I called patient, order placed

## 2018-06-02 NOTE — Progress Notes (Unsigned)
img 

## 2018-06-02 NOTE — Telephone Encounter (Signed)
Patient calling to request order for injection be sent to [facility] today, if it has not been already. Per patient, [facility] does not have referral yet. Please send attn. Roberta, per patient.

## 2018-06-27 ENCOUNTER — Ambulatory Visit
Admission: RE | Admit: 2018-06-27 | Discharge: 2018-06-27 | Disposition: A | Discharge status: Home / Self Care | Payer: Medicare Other | Source: Ambulatory Visit | Attending: Orthopedic Surgery | Admitting: Orthopedic Surgery

## 2018-06-27 DIAGNOSIS — M5442 Lumbago with sciatica, left side: Secondary | ICD-10-CM

## 2018-06-27 DIAGNOSIS — G8929 Other chronic pain: Secondary | ICD-10-CM

## 2018-06-27 MED ORDER — IOPAMIDOL (ISOVUE-M 200) INJECTION 41%
1.0000 mL | Freq: Once | INTRAMUSCULAR | Status: AC
  Administered 2018-06-27: 14:00:00 1 mL via EPIDURAL

## 2018-06-27 MED ORDER — METHYLPREDNISOLONE ACETATE 40 MG/ML INJ SUSP (RADIOLOG
120.0000 mg | Freq: Once | INTRAMUSCULAR | Status: AC
  Administered 2018-06-27: 120 mg via EPIDURAL

## 2018-06-27 NOTE — Discharge Instructions (Signed)

## 2018-07-21 ENCOUNTER — Telehealth: Payer: Self-pay | Admitting: Orthopaedic Surgery

## 2018-07-21 ENCOUNTER — Other Ambulatory Visit: Payer: Self-pay | Admitting: *Deleted

## 2018-07-21 DIAGNOSIS — G8929 Other chronic pain: Secondary | ICD-10-CM

## 2018-07-21 DIAGNOSIS — M5442 Lumbago with sciatica, left side: Secondary | ICD-10-CM

## 2018-07-21 NOTE — Telephone Encounter (Signed)
Please advise 

## 2018-07-21 NOTE — Telephone Encounter (Signed)
Ok for referral?

## 2018-07-21 NOTE — Telephone Encounter (Signed)
Patient left a voicemail stating he had epidural injection on 06/27/18 which didn't work.  Patient states he scheduled another injection at [place] on 08/01/18 and is requesting a referral be sent to [facility].  Please advise.

## 2018-07-21 NOTE — Telephone Encounter (Signed)
Ordered, LMOM for Theodore Stevenson at [place]

## 2018-08-01 ENCOUNTER — Other Ambulatory Visit: Payer: Self-pay

## 2018-08-01 ENCOUNTER — Ambulatory Visit
Admission: RE | Admit: 2018-08-01 | Discharge: 2018-08-01 | Disposition: A | Discharge status: Home / Self Care | Payer: Medicare Other | Source: Ambulatory Visit | Attending: Orthopaedic Surgery | Admitting: Orthopaedic Surgery

## 2018-08-01 DIAGNOSIS — G8929 Other chronic pain: Secondary | ICD-10-CM

## 2018-08-01 DIAGNOSIS — M5442 Lumbago with sciatica, left side: Secondary | ICD-10-CM

## 2018-08-01 MED ORDER — IOPAMIDOL (ISOVUE-M 200) INJECTION 41%
1.0000 mL | Freq: Once | INTRAMUSCULAR | Status: AC
  Administered 2018-08-01: 15:00:00 1 mL via EPIDURAL

## 2018-08-01 MED ORDER — METHYLPREDNISOLONE ACETATE 40 MG/ML INJ SUSP (RADIOLOG
120.0000 mg | Freq: Once | INTRAMUSCULAR | Status: AC
  Administered 2018-08-01: 15:00:00 120 mg via EPIDURAL

## 2018-10-05 ENCOUNTER — Telehealth: Payer: Self-pay | Admitting: Orthopaedic Surgery

## 2018-10-05 NOTE — Telephone Encounter (Addendum)
Patient called requesting a referral for an epidural injection at [facility].  Patient states it can be sent to [facility].

## 2018-10-05 NOTE — Telephone Encounter (Signed)
Ok for referral?

## 2018-10-05 NOTE — Telephone Encounter (Signed)
Please advise 

## 2018-10-06 ENCOUNTER — Other Ambulatory Visit: Payer: Self-pay | Admitting: Orthopaedic Surgery

## 2018-10-06 DIAGNOSIS — G8929 Other chronic pain: Secondary | ICD-10-CM

## 2018-10-06 NOTE — Telephone Encounter (Signed)
Called and advised patient that order has been placed.

## 2018-10-14 ENCOUNTER — Other Ambulatory Visit: Payer: Medicare Other

## 2018-10-20 ENCOUNTER — Other Ambulatory Visit: Payer: Self-pay

## 2018-10-20 ENCOUNTER — Ambulatory Visit
Admission: RE | Admit: 2018-10-20 | Discharge: 2018-10-20 | Disposition: A | Discharge status: Home / Self Care | Payer: Medicare Other | Source: Ambulatory Visit | Attending: Orthopaedic Surgery | Admitting: Orthopaedic Surgery

## 2018-10-20 DIAGNOSIS — G8929 Other chronic pain: Secondary | ICD-10-CM

## 2018-10-20 MED ORDER — METHYLPREDNISOLONE ACETATE 40 MG/ML INJ SUSP (RADIOLOG
120.0000 mg | Freq: Once | INTRAMUSCULAR | Status: AC
  Administered 2018-10-20: 120 mg via EPIDURAL

## 2018-10-20 MED ORDER — IOPAMIDOL (ISOVUE-M 200) INJECTION 41%
1.0000 mL | Freq: Once | INTRAMUSCULAR | Status: AC
  Administered 2018-10-20: 1 mL via EPIDURAL

## 2018-10-20 NOTE — Discharge Instructions (Signed)

## 2018-12-26 ENCOUNTER — Other Ambulatory Visit: Payer: Self-pay | Admitting: Orthopaedic Surgery

## 2018-12-26 ENCOUNTER — Telehealth: Payer: Self-pay | Admitting: Orthopaedic Surgery

## 2018-12-26 DIAGNOSIS — M5441 Lumbago with sciatica, right side: Secondary | ICD-10-CM

## 2018-12-26 DIAGNOSIS — M5442 Lumbago with sciatica, left side: Secondary | ICD-10-CM

## 2018-12-26 NOTE — Telephone Encounter (Signed)
Please advise 

## 2018-12-26 NOTE — Telephone Encounter (Signed)
Ok to order 

## 2018-12-26 NOTE — Telephone Encounter (Signed)
Patient called this morning requesting to authorization to get another injection in his back at [facility].  CB#[phone].  Thank you.

## 2018-12-26 NOTE — Telephone Encounter (Signed)
Spoke with patient. He has been advised that ESI has been ordered for [facility].

## 2019-01-04 ENCOUNTER — Inpatient Hospital Stay: Admission: RE | Admit: 2019-01-04 | Payer: Medicare Other | Source: Ambulatory Visit

## 2019-01-12 ENCOUNTER — Ambulatory Visit
Admission: RE | Admit: 2019-01-12 | Discharge: 2019-01-12 | Disposition: A | Discharge status: Home / Self Care | Payer: Medicare Other | Source: Ambulatory Visit | Attending: Orthopaedic Surgery | Admitting: Orthopaedic Surgery

## 2019-01-12 ENCOUNTER — Other Ambulatory Visit: Payer: Self-pay

## 2019-01-12 DIAGNOSIS — M5442 Lumbago with sciatica, left side: Secondary | ICD-10-CM

## 2019-01-12 DIAGNOSIS — G8929 Other chronic pain: Secondary | ICD-10-CM

## 2019-01-12 MED ORDER — IOPAMIDOL (ISOVUE-M 200) INJECTION 41%
1.0000 mL | Freq: Once | INTRAMUSCULAR | Status: AC
  Administered 2019-01-12: 1 mL via EPIDURAL

## 2019-01-12 MED ORDER — METHYLPREDNISOLONE ACETATE 40 MG/ML INJ SUSP (RADIOLOG
120.0000 mg | Freq: Once | INTRAMUSCULAR | Status: AC
  Administered 2019-01-12: 120 mg via EPIDURAL

## 2019-01-12 NOTE — Discharge Instructions (Signed)

## 2019-02-22 ENCOUNTER — Telehealth: Payer: Self-pay | Admitting: Orthopaedic Surgery

## 2019-02-22 ENCOUNTER — Other Ambulatory Visit: Payer: Self-pay

## 2019-02-22 DIAGNOSIS — G8929 Other chronic pain: Secondary | ICD-10-CM

## 2019-02-22 DIAGNOSIS — M5442 Lumbago with sciatica, left side: Secondary | ICD-10-CM

## 2019-02-22 NOTE — Telephone Encounter (Signed)
Ok to schedule.

## 2019-02-22 NOTE — Telephone Encounter (Signed)
Patient called.   He is wanting to get the process started for receiving an epidural injection   Call back number: [phone]

## 2019-02-22 NOTE — Telephone Encounter (Signed)
Please advise 

## 2019-02-23 NOTE — Telephone Encounter (Signed)
Patient notified

## 2019-03-06 ENCOUNTER — Other Ambulatory Visit: Payer: Medicare Other

## 2019-03-14 ENCOUNTER — Ambulatory Visit
Admission: RE | Admit: 2019-03-14 | Discharge: 2019-03-14 | Disposition: A | Discharge status: Home / Self Care | Payer: Medicare Other | Source: Ambulatory Visit | Attending: Orthopaedic Surgery | Admitting: Orthopaedic Surgery

## 2019-03-14 DIAGNOSIS — G8929 Other chronic pain: Secondary | ICD-10-CM

## 2019-03-14 DIAGNOSIS — M5441 Lumbago with sciatica, right side: Secondary | ICD-10-CM

## 2019-03-14 MED ORDER — IOPAMIDOL (ISOVUE-M 200) INJECTION 41%
1.0000 mL | Freq: Once | INTRAMUSCULAR | Status: AC
  Administered 2019-03-14: 1 mL via EPIDURAL

## 2019-03-14 MED ORDER — METHYLPREDNISOLONE ACETATE 40 MG/ML INJ SUSP (RADIOLOG
120.0000 mg | Freq: Once | INTRAMUSCULAR | Status: AC
  Administered 2019-03-14: 120 mg via EPIDURAL

## 2019-07-22 ENCOUNTER — Emergency Department (HOSPITAL_BASED_OUTPATIENT_CLINIC_OR_DEPARTMENT_OTHER): Payer: Medicare Other

## 2019-07-22 ENCOUNTER — Emergency Department (HOSPITAL_BASED_OUTPATIENT_CLINIC_OR_DEPARTMENT_OTHER)
Admission: EM | Admit: 2019-07-22 | Discharge: 2019-07-22 | Disposition: A | Discharge status: Home / Self Care | Payer: Medicare Other | Source: Home / Self Care | Attending: Emergency Medicine | Admitting: Emergency Medicine

## 2019-07-22 ENCOUNTER — Other Ambulatory Visit: Payer: Self-pay

## 2019-07-22 ENCOUNTER — Encounter (HOSPITAL_BASED_OUTPATIENT_CLINIC_OR_DEPARTMENT_OTHER): Payer: Self-pay | Admitting: Emergency Medicine

## 2019-07-22 DIAGNOSIS — Z87891 Personal history of nicotine dependence: Secondary | ICD-10-CM | POA: Insufficient documentation

## 2019-07-22 DIAGNOSIS — I10 Essential (primary) hypertension: Secondary | ICD-10-CM | POA: Insufficient documentation

## 2019-07-22 DIAGNOSIS — K529 Noninfective gastroenteritis and colitis, unspecified: Secondary | ICD-10-CM | POA: Insufficient documentation

## 2019-07-22 DIAGNOSIS — A4151 Sepsis due to Escherichia coli [E. coli]: Secondary | ICD-10-CM | POA: Diagnosis not present

## 2019-07-22 DIAGNOSIS — Z79899 Other long term (current) drug therapy: Secondary | ICD-10-CM | POA: Insufficient documentation

## 2019-07-22 DIAGNOSIS — Z96641 Presence of right artificial hip joint: Secondary | ICD-10-CM | POA: Insufficient documentation

## 2019-07-22 LAB — URINALYSIS, ROUTINE W REFLEX MICROSCOPIC
Bilirubin Urine: NEGATIVE
Glucose, UA: NEGATIVE mg/dL
Hgb urine dipstick: NEGATIVE
Ketones, ur: NEGATIVE mg/dL
Leukocytes,Ua: NEGATIVE
Nitrite: NEGATIVE
Protein, ur: NEGATIVE mg/dL
Specific Gravity, Urine: <1.005 — ABNORMAL LOW (ref 1.005–1.030)
pH: 6 (ref 5.0–8.0)

## 2019-07-22 LAB — CBC
HCT: 48.1 % (ref 39.0–52.0)
Hemoglobin: 15.7 g/dL (ref 13.0–17.0)
MCH: 30.1 pg (ref 26.0–34.0)
MCHC: 32.6 g/dL (ref 30.0–36.0)
MCV: 92.1 fL (ref 80.0–100.0)
Platelets: 247 10*3/uL (ref 150–400)
RBC: 5.22 MIL/uL (ref 4.22–5.81)
RDW: 12.5 % (ref 11.5–15.5)
WBC: 16.6 10*3/uL — ABNORMAL HIGH (ref 4.0–10.5)
nRBC: 0 % (ref 0.0–0.2)

## 2019-07-22 LAB — COMPREHENSIVE METABOLIC PANEL
ALT: 18 U/L (ref 0–44)
AST: 22 U/L (ref 15–41)
Albumin: 4 g/dL (ref 3.5–5.0)
Alkaline Phosphatase: 84 U/L (ref 38–126)
Anion gap: 13 (ref 5–15)
BUN: 20 mg/dL (ref 8–23)
CO2: 25 mmol/L (ref 22–32)
Calcium: 8.8 mg/dL — ABNORMAL LOW (ref 8.9–10.3)
Chloride: 101 mmol/L (ref 98–111)
Creatinine, Ser: 1.23 mg/dL (ref 0.61–1.24)
GFR calc Af Amer: >60 mL/min (ref >60)
GFR calc non Af Amer: >60 mL/min (ref >60)
Glucose, Bld: 146 mg/dL — ABNORMAL HIGH (ref 70–99)
Potassium: 4.1 mmol/L (ref 3.5–5.1)
Sodium: 139 mmol/L (ref 135–145)
Total Bilirubin: 0.5 mg/dL (ref 0.3–1.2)
Total Protein: 7.3 g/dL (ref 6.5–8.1)

## 2019-07-22 LAB — LIPASE, BLOOD: Lipase: 17 U/L (ref 11–51)

## 2019-07-22 MED ORDER — SODIUM CHLORIDE 0.9 % IV SOLN
INTRAVENOUS | Status: DC | PRN
  Administered 2019-07-22: 250 mL via INTRAVENOUS

## 2019-07-22 MED ORDER — CIPROFLOXACIN IN D5W 400 MG/200ML IV SOLN
400.0000 mg | Freq: Once | INTRAVENOUS | Status: AC
  Administered 2019-07-22: 400 mg via INTRAVENOUS
  Filled 2019-07-22: qty 200

## 2019-07-22 MED ORDER — KETOROLAC TROMETHAMINE 15 MG/ML IJ SOLN
15.0000 mg | Freq: Once | INTRAMUSCULAR | Status: AC
  Administered 2019-07-22: 15 mg via INTRAVENOUS
  Filled 2019-07-22: qty 1

## 2019-07-22 MED ORDER — FENTANYL CITRATE (PF) 100 MCG/2ML IJ SOLN
50.0000 ug | INTRAMUSCULAR | Status: DC | PRN
  Administered 2019-07-22: 50 ug via INTRAVENOUS
  Filled 2019-07-22: qty 2

## 2019-07-22 MED ORDER — ONDANSETRON HCL 4 MG/2ML IJ SOLN
4.0000 mg | Freq: Once | INTRAMUSCULAR | Status: AC
  Administered 2019-07-22: 4 mg via INTRAVENOUS
  Filled 2019-07-22: qty 2

## 2019-07-22 MED ORDER — IOHEXOL 300 MG/ML  SOLN
100.0000 mL | Freq: Once | INTRAMUSCULAR | Status: AC | PRN
  Administered 2019-07-22: 100 mL via INTRAVENOUS

## 2019-07-22 MED ORDER — METRONIDAZOLE 500 MG PO TABS
500.0000 mg | ORAL_TABLET | Freq: Three times a day (TID) | ORAL | 0 refills | Status: DC
Start: 2019-07-22 — End: 2019-07-27

## 2019-07-22 MED ORDER — LACTATED RINGERS IV BOLUS
1000.0000 mL | Freq: Once | INTRAVENOUS | Status: AC
  Administered 2019-07-22: 1000 mL via INTRAVENOUS

## 2019-07-22 MED ORDER — CIPROFLOXACIN HCL 500 MG PO TABS
500.0000 mg | ORAL_TABLET | Freq: Two times a day (BID) | ORAL | 0 refills | Status: DC
Start: 2019-07-22 — End: 2019-07-27

## 2019-07-22 MED ORDER — METRONIDAZOLE IN NACL 5-0.79 MG/ML-% IV SOLN
500.0000 mg | Freq: Once | INTRAVENOUS | Status: AC
  Administered 2019-07-22: 500 mg via INTRAVENOUS
  Filled 2019-07-22: qty 100

## 2019-07-22 MED ORDER — OXYCODONE-ACETAMINOPHEN 5-325 MG PO TABS: 1.0000 | ORAL_TABLET | Freq: Four times a day (QID) | ORAL | 0 refills | Status: DC | PRN

## 2019-07-22 MED ORDER — HYDROMORPHONE HCL 1 MG/ML IJ SOLN
1.0000 mg | Freq: Once | INTRAMUSCULAR | Status: AC
  Administered 2019-07-22: 1 mg via INTRAVENOUS
  Filled 2019-07-22: qty 1

## 2019-07-22 NOTE — ED Notes (Signed)
Patient transported to CT 

## 2019-07-22 NOTE — ED Triage Notes (Signed)
RLQ pain with diarrhea x 2 days.

## 2019-07-22 NOTE — ED Provider Notes (Signed)
[facility] EMERGENCY DEPARTMENT Provider Note   CSN: [id number] Arrival date & time: 07/22/19  1226     History Chief Complaint  Patient presents with  . Abdominal Pain    Theodore Stevenson is a 64 y.o. male.  HPI   64 year old male with abdominal pain and diarrhea. Onset about 2 days ago. Persistent since. Pain is in the right lower quadrant. Waxes and wanes without appreciable exacerbating relieving factors. Multiple diarrheal stools. No blood. No fevers. He is on well water. No sick contacts with similar symptoms. No fevers or chills. Denies prior abdominal surgery.  Past Medical History:  Diagnosis Date  . Arthritis   . Hepatitis    1973  . Hypertension   . Spinal stenosis     Patient Active Problem List   Diagnosis Date Noted  . Chronic left-sided low back pain with left-sided sciatica 02/14/2018  . Avascular necrosis of hip (HCC) 07/02/2011  . Hypertension 07/02/2011  . Hepatitis 07/02/2011  . Postoperative anemia due to acute blood loss 07/02/2011    Past Surgical History:  Procedure Laterality Date  . BACK SURGERY     1987  . CARPAL TUNNEL RELEASE     right and left  . HIP SURGERY  06/30/2011   total left hip  . JOINT REPLACEMENT     right total hip  . TOTAL HIP ARTHROPLASTY  06/30/2011   Procedure: TOTAL HIP ARTHROPLASTY;  Surgeon: Valeria Batman, MD;  Location: [facility] OR;  Service: Orthopedics;  Laterality: Left;       No family history on file.  Social History   Tobacco Use  . Smoking status: Former Smoker    Packs/day: 1.00    Years: 10.00    Pack years: 10.00    Types: Cigarettes    Quit date: 06/22/2006    Years since quitting: 13.0  . Smokeless tobacco: Current User    Types: Chew  Vaping Use  . Vaping Use: Never used  Substance Use Topics  . Alcohol use: No  . Drug use: No    Home Medications Prior to Admission medications   Medication Sig Start Date End Date Taking? Authorizing Provider  benazepril (LOTENSIN) 40 MG  tablet Take 40 mg by mouth daily.    [provider]  benzonatate (TESSALON) 100 MG capsule  02/11/18   [provider]  ciprofloxacin (CIPRO) 500 MG tablet Take 1 tablet (500 mg total) by mouth every 12 (twelve) hours. 07/22/19   Raeford Razor, MD  diclofenac (VOLTAREN) 50 MG EC tablet Take 1 tablet (50 mg total) by mouth 2 (two) times daily. Patient not taking: Reported on 07/26/2017 04/14/16   Jetty Peeks, PA-C  ezetimibe-simvastatin (VYTORIN) 10-40 MG per tablet Take 1 tablet by mouth daily.    [provider]  lisinopril-hydrochlorothiazide (PRINZIDE,ZESTORETIC) 20-25 MG tablet TAKE 1.5 TABLETS BY MOUTH EVERY DAY 03/27/15   [provider]  methylPREDNISolone (MEDROL) 4 MG tablet DAY 1 TAKE 6 TABS, DAY 2 TAKE 5 TABS, DAY 3 TAKE 4 TABS, DAY 4 TAKE 3 TABS, DAY 5 TAKE 2 TABS, DAY 6 TAKE 1 TAB Patient not taking: Reported on 02/14/2018 07/26/17   Valeria Batman, MD  metroNIDAZOLE (FLAGYL) 500 MG tablet Take 1 tablet (500 mg total) by mouth 3 (three) times daily. 07/22/19   Raeford Razor, MD  oxyCODONE-acetaminophen (PERCOCET/ROXICET) 5-325 MG tablet Take 1-2 tablets by mouth every 6 (six) hours as needed for severe pain. 07/22/19   Raeford Razor, MD  Allergies    Codeine and Penicillins  Review of Systems   Review of Systems All systems reviewed and negative, other than as noted in HPI.  Physical Exam Updated Vital Signs BP 115/68 (BP Location: Left Arm)   Pulse 77   Temp 98.2 F (36.8 C) (Oral)   Resp 16   Ht 5\' 5"  (1.651 m)   Wt 83.9 kg   SpO2 100%   BMI 30.79 kg/m   Physical Exam Vitals and nursing note reviewed.  Constitutional:      General: He is not in acute distress.    Appearance: He is well-developed.  HENT:     Head: Normocephalic and atraumatic.  Eyes:     General:        Right eye: No discharge.        Left eye: No discharge.     Conjunctiva/sclera: Conjunctivae normal.  Cardiovascular:     Rate and Rhythm: Normal  rate and regular rhythm.     Heart sounds: Normal heart sounds. No murmur heard.  No friction rub. No gallop.   Pulmonary:     Effort: Pulmonary effort is normal. No respiratory distress.     Breath sounds: Normal breath sounds.  Abdominal:     General: There is no distension.     Palpations: Abdomen is soft.     Tenderness: There is abdominal tenderness.     Comments: Tenderness in the right lower quadrant and to a lesser degree in the right upper quadrant without rebound or guarding. No distention.  Musculoskeletal:        General: No tenderness.     Cervical back: Neck supple.  Skin:    General: Skin is warm and dry.  Neurological:     Mental Status: He is alert.  Psychiatric:        Behavior: Behavior normal.        Thought Content: Thought content normal.     ED Results / Procedures / Treatments   Labs (all labs ordered are listed, but only abnormal results are displayed) Labs Reviewed  COMPREHENSIVE METABOLIC PANEL - Abnormal; Notable for the following components:      Result Value   Glucose, Bld 146 (*)    Calcium 8.8 (*)    All other components within normal limits  CBC - Abnormal; Notable for the following components:   WBC 16.6 (*)    All other components within normal limits  URINALYSIS, ROUTINE W REFLEX MICROSCOPIC - Abnormal; Notable for the following components:   Specific Gravity, Urine <1.005 (*)    All other components within normal limits  GASTROINTESTINAL PANEL BY PCR, STOOL (REPLACES STOOL CULTURE)  LIPASE, BLOOD    EKG None  Radiology CT ABDOMEN PELVIS W CONTRAST  Result Date: 07/22/2019 CLINICAL DATA:  Right lower quadrant abdominal pain and diarrhea for 2 days. EXAM: CT ABDOMEN AND PELVIS WITH CONTRAST TECHNIQUE: Multidetector CT imaging of the abdomen and pelvis was performed using the standard protocol following bolus administration of intravenous contrast. CONTRAST:  100mL OMNIPAQUE IOHEXOL 300 MG/ML  SOLN COMPARISON:  None. FINDINGS: Lower  chest: Insert lung bases Hepatobiliary: No focal hepatic lesions or intrahepatic biliary dilatation. The gallbladder is normal. No common bile duct dilatation. Pancreas: No mass, inflammation or ductal dilatation. Spleen: Normal size.  No focal lesions. Adrenals/Urinary Tract: The adrenal glands and kidneys are unremarkable. The bladder is grossly normal. Moderate artifact due to bilateral hip prostheses. Stomach/Bowel: The stomach, duodenum and small bowel are unremarkable. No acute inflammatory  changes, mass lesions or obstructive findings. Terminal ileum is normal. Severe diffuse inflammatory or infectious process involving the ascending colon. Marked colonic wall thickening, pericolonic interstitial changes and pericolonic fluid. No involvement of the transverse colon or the remainder of the colon. Moderate descending colon and sigmoid colon diverticulosis but no findings for acute diverticulitis. The appendix is normal. Vascular/Lymphatic: Advanced atherosclerotic calcifications involving the aorta and iliac arteries and branch vessels. No aneurysm or dissection. The major venous structures are patent. Small scattered mesenteric and retroperitoneal lymph nodes but no mass or overt adenopathy. Reproductive: The prostate gland and seminal vesicles are grossly normal but limited evaluation due to artifact. There is a large left scrotal hydrocele noted. Other: No abdominal wall hernia or subcutaneous lesions. Musculoskeletal: Bilateral hip prostheses with significant artifact. Advanced degenerative changes are noted in the lower lumbar spine. IMPRESSION: 1. Severe diffuse inflammatory or infectious colitis involving the ascending colon. 2. No other significant abdominal/pelvic findings, mass lesions or adenopathy. 3. Advanced atherosclerotic calcifications involving the aorta and branch vessels. 4. Large left scrotal hydrocele. Aortic Atherosclerosis (ICD10-I70.0). Electronically Signed   By: Rudie Meyer M.D.    On: 07/22/2019 14:34    Procedures Procedures (including critical care time)  Medications Ordered in ED Medications  fentaNYL (SUBLIMAZE) injection 50 mcg (50 mcg Intravenous Given 07/22/19 1242)  metroNIDAZOLE (FLAGYL) IVPB 500 mg (has no administration in time range)  ciprofloxacin (CIPRO) IVPB 400 mg (has no administration in time range)  0.9 %  sodium chloride infusion (has no administration in time range)  0.9 %  sodium chloride infusion (has no administration in time range)  ondansetron (ZOFRAN) injection 4 mg (4 mg Intravenous Given 07/22/19 1241)  lactated ringers bolus 1,000 mL (1,000 mLs Intravenous New Bag/Given 07/22/19 1432)  iohexol (OMNIPAQUE) 300 MG/ML solution 100 mL (100 mLs Intravenous Contrast Given 07/22/19 1407)  HYDROmorphone (DILAUDID) injection 1 mg (1 mg Intravenous Given 07/22/19 1427)  ketorolac (TORADOL) 15 MG/ML injection 15 mg (15 mg Intravenous Given 07/22/19 1521)    ED Course  I have reviewed the triage vital signs and the nursing notes.  Pertinent labs & imaging results that were available during my care of the patient were reviewed by me and considered in my medical decision making (see chart for details).    MDM Rules/Calculators/A&P                          64yM with R sided abdominal pain and diarrhea. Ascending colitis on imaging. Has leukocytosis but afebrile. Non-bloody stool. On well water but no other household members with symptoms. Only symptoms for two days. Will cover for possible bacterial infectious process. Stool studies if he can provide a sample in the ED. Symptoms improved in ED. He feels comfortable with outpt management. Continued abx and PRN pain medications. Return precautions discussed. Outpt FU otherwise.   Final Clinical Impression(s) / ED Diagnoses Final diagnoses:  Colitis    Rx / DC Orders ED Discharge Orders         Ordered    ciprofloxacin (CIPRO) 500 MG tablet  Every 12 hours     Discontinue  Reprint     07/22/19  1504    metroNIDAZOLE (FLAGYL) 500 MG tablet  3 times daily     Discontinue  Reprint     07/22/19 1504    oxyCODONE-acetaminophen (PERCOCET/ROXICET) 5-325 MG tablet  Every 6 hours PRN     Discontinue  Reprint     07/22/19 1504  Raeford Razor, MD 07/23/19 [phone]

## 2019-07-22 NOTE — ED Notes (Signed)
Pt dry heaving during triage.

## 2019-07-24 ENCOUNTER — Other Ambulatory Visit: Payer: Self-pay

## 2019-07-24 ENCOUNTER — Encounter (HOSPITAL_BASED_OUTPATIENT_CLINIC_OR_DEPARTMENT_OTHER): Payer: Self-pay | Admitting: Emergency Medicine

## 2019-07-24 ENCOUNTER — Inpatient Hospital Stay (HOSPITAL_BASED_OUTPATIENT_CLINIC_OR_DEPARTMENT_OTHER)
Admission: EM | Admit: 2019-07-24 | Discharge: 2019-07-27 | DRG: 872 | Disposition: A | Discharge status: Home / Self Care | Payer: Medicare Other | Attending: Internal Medicine | Admitting: Internal Medicine

## 2019-07-24 ENCOUNTER — Emergency Department (HOSPITAL_BASED_OUTPATIENT_CLINIC_OR_DEPARTMENT_OTHER): Payer: Medicare Other

## 2019-07-24 DIAGNOSIS — R809 Proteinuria, unspecified: Secondary | ICD-10-CM | POA: Diagnosis present

## 2019-07-24 DIAGNOSIS — R188 Other ascites: Secondary | ICD-10-CM | POA: Diagnosis present

## 2019-07-24 DIAGNOSIS — R319 Hematuria, unspecified: Secondary | ICD-10-CM | POA: Diagnosis present

## 2019-07-24 DIAGNOSIS — R822 Biliuria: Secondary | ICD-10-CM | POA: Diagnosis present

## 2019-07-24 DIAGNOSIS — R739 Hyperglycemia, unspecified: Secondary | ICD-10-CM | POA: Diagnosis present

## 2019-07-24 DIAGNOSIS — I10 Essential (primary) hypertension: Secondary | ICD-10-CM | POA: Diagnosis present

## 2019-07-24 DIAGNOSIS — K759 Inflammatory liver disease, unspecified: Secondary | ICD-10-CM | POA: Diagnosis present

## 2019-07-24 DIAGNOSIS — A044 Other intestinal Escherichia coli infections: Secondary | ICD-10-CM | POA: Diagnosis present

## 2019-07-24 DIAGNOSIS — K529 Noninfective gastroenteritis and colitis, unspecified: Secondary | ICD-10-CM | POA: Diagnosis present

## 2019-07-24 DIAGNOSIS — Z880 Allergy status to penicillin: Secondary | ICD-10-CM

## 2019-07-24 DIAGNOSIS — Z20822 Contact with and (suspected) exposure to covid-19: Secondary | ICD-10-CM | POA: Diagnosis present

## 2019-07-24 DIAGNOSIS — R824 Acetonuria: Secondary | ICD-10-CM | POA: Diagnosis present

## 2019-07-24 DIAGNOSIS — A4151 Sepsis due to Escherichia coli [E. coli]: Secondary | ICD-10-CM | POA: Diagnosis present

## 2019-07-24 DIAGNOSIS — Z79899 Other long term (current) drug therapy: Secondary | ICD-10-CM

## 2019-07-24 DIAGNOSIS — Z87891 Personal history of nicotine dependence: Secondary | ICD-10-CM | POA: Diagnosis not present

## 2019-07-24 DIAGNOSIS — Z885 Allergy status to narcotic agent status: Secondary | ICD-10-CM | POA: Diagnosis not present

## 2019-07-24 DIAGNOSIS — K862 Cyst of pancreas: Secondary | ICD-10-CM | POA: Diagnosis present

## 2019-07-24 DIAGNOSIS — R109 Unspecified abdominal pain: Secondary | ICD-10-CM

## 2019-07-24 LAB — URINALYSIS, MICROSCOPIC (REFLEX)

## 2019-07-24 LAB — TROPONIN I (HIGH SENSITIVITY): Troponin I (High Sensitivity): 9 ng/L (ref <18)

## 2019-07-24 LAB — CBC
HCT: 48.5 % (ref 39.0–52.0)
HCT: 42.2 % (ref 39.0–52.0)
Hemoglobin: 16.2 g/dL (ref 13.0–17.0)
Hemoglobin: 14.4 g/dL (ref 13.0–17.0)
MCH: 30.1 pg (ref 26.0–34.0)
MCH: 31.2 pg (ref 26.0–34.0)
MCHC: 33.4 g/dL (ref 30.0–36.0)
MCHC: 34.1 g/dL (ref 30.0–36.0)
MCV: 90 fL (ref 80.0–100.0)
MCV: 91.3 fL (ref 80.0–100.0)
Platelets: 246 10*3/uL (ref 150–400)
Platelets: 247 10*3/uL (ref 150–400)
RBC: 5.39 MIL/uL (ref 4.22–5.81)
RBC: 4.62 MIL/uL (ref 4.22–5.81)
RDW: 12.3 % (ref 11.5–15.5)
RDW: 12.7 % (ref 11.5–15.5)
WBC: 21.3 10*3/uL — ABNORMAL HIGH (ref 4.0–10.5)
WBC: 20.9 10*3/uL — ABNORMAL HIGH (ref 4.0–10.5)
nRBC: 0 % (ref 0.0–0.2)
nRBC: 0 % (ref 0.0–0.2)

## 2019-07-24 LAB — COMPREHENSIVE METABOLIC PANEL
ALT: 14 U/L (ref 0–44)
AST: 21 U/L (ref 15–41)
Albumin: 3.5 g/dL (ref 3.5–5.0)
Alkaline Phosphatase: 74 U/L (ref 38–126)
Anion gap: 15 (ref 5–15)
BUN: 29 mg/dL — ABNORMAL HIGH (ref 8–23)
CO2: 24 mmol/L (ref 22–32)
Calcium: 8.6 mg/dL — ABNORMAL LOW (ref 8.9–10.3)
Chloride: 95 mmol/L — ABNORMAL LOW (ref 98–111)
Creatinine, Ser: 1.17 mg/dL (ref 0.61–1.24)
GFR calc Af Amer: >60 mL/min (ref >60)
GFR calc non Af Amer: >60 mL/min (ref >60)
Glucose, Bld: 201 mg/dL — ABNORMAL HIGH (ref 70–99)
Potassium: 3.9 mmol/L (ref 3.5–5.1)
Sodium: 134 mmol/L — ABNORMAL LOW (ref 135–145)
Total Bilirubin: 0.5 mg/dL (ref 0.3–1.2)
Total Protein: 6.7 g/dL (ref 6.5–8.1)

## 2019-07-24 LAB — URINALYSIS, ROUTINE W REFLEX MICROSCOPIC
Glucose, UA: 100 mg/dL — AB
Ketones, ur: 15 mg/dL — AB
Nitrite: NEGATIVE
Protein, ur: 100 mg/dL — AB
Specific Gravity, Urine: >1.03 — ABNORMAL HIGH (ref 1.005–1.030)
pH: 5.5 (ref 5.0–8.0)

## 2019-07-24 LAB — LIPASE, BLOOD: Lipase: 14 U/L (ref 11–51)

## 2019-07-24 LAB — LACTIC ACID, PLASMA
Lactic Acid, Venous: 2.5 mmol/L (ref 0.5–1.9)
Lactic Acid, Venous: 1.5 mmol/L (ref 0.5–1.9)

## 2019-07-24 LAB — SARS CORONAVIRUS 2 BY RT PCR (HOSPITAL ORDER, PERFORMED IN ~~LOC~~ HOSPITAL LAB): SARS Coronavirus 2: NEGATIVE

## 2019-07-24 LAB — OCCULT BLOOD X 1 CARD TO LAB, STOOL: Fecal Occult Bld: POSITIVE — AB

## 2019-07-24 MED ORDER — ONDANSETRON HCL 4 MG/2ML IJ SOLN
4.0000 mg | Freq: Four times a day (QID) | INTRAMUSCULAR | Status: DC | PRN
  Administered 2019-07-25: 4 mg via INTRAVENOUS
  Filled 2019-07-24 (×2): qty 2

## 2019-07-24 MED ORDER — METOCLOPRAMIDE HCL 5 MG/ML IJ SOLN
5.0000 mg | Freq: Once | INTRAMUSCULAR | Status: AC
  Administered 2019-07-24: 5 mg via INTRAVENOUS
  Filled 2019-07-24: qty 2

## 2019-07-24 MED ORDER — METOCLOPRAMIDE HCL 5 MG/ML IJ SOLN
10.0000 mg | Freq: Once | INTRAMUSCULAR | Status: AC
  Administered 2019-07-24: 10 mg via INTRAVENOUS
  Filled 2019-07-24: qty 2

## 2019-07-24 MED ORDER — ONDANSETRON HCL 4 MG PO TABS: 4.0000 mg | ORAL_TABLET | Freq: Four times a day (QID) | ORAL | Status: DC | PRN

## 2019-07-24 MED ORDER — GABAPENTIN 300 MG PO CAPS
600.0000 mg | ORAL_CAPSULE | Freq: Every day | ORAL | Status: DC
  Administered 2019-07-24 – 2019-07-26 (×3): 600 mg via ORAL
  Filled 2019-07-24 (×3): qty 2

## 2019-07-24 MED ORDER — IOHEXOL 300 MG/ML  SOLN
100.0000 mL | Freq: Once | INTRAMUSCULAR | Status: AC | PRN
  Administered 2019-07-24: 100 mL via INTRAVENOUS

## 2019-07-24 MED ORDER — MORPHINE SULFATE (PF) 4 MG/ML IV SOLN
4.0000 mg | Freq: Once | INTRAVENOUS | Status: AC
  Administered 2019-07-24: 4 mg via INTRAVENOUS
  Filled 2019-07-24: qty 1

## 2019-07-24 MED ORDER — SODIUM CHLORIDE 0.9 % IV SOLN
INTRAVENOUS | Status: DC | PRN
  Administered 2019-07-24: 500 mL via INTRAVENOUS

## 2019-07-24 MED ORDER — ALUM & MAG HYDROXIDE-SIMETH 200-200-20 MG/5ML PO SUSP
30.0000 mL | Freq: Once | ORAL | Status: DC
  Filled 2019-07-24: qty 30

## 2019-07-24 MED ORDER — ONDANSETRON HCL 4 MG/2ML IJ SOLN
4.0000 mg | Freq: Three times a day (TID) | INTRAMUSCULAR | Status: DC | PRN
  Administered 2019-07-24: 4 mg via INTRAVENOUS
  Filled 2019-07-24: qty 2

## 2019-07-24 MED ORDER — FENTANYL CITRATE (PF) 100 MCG/2ML IJ SOLN
25.0000 ug | INTRAMUSCULAR | Status: DC | PRN
  Administered 2019-07-25 (×2): 25 ug via INTRAVENOUS
  Filled 2019-07-24 (×2): qty 2

## 2019-07-24 MED ORDER — SODIUM CHLORIDE 0.9 % IV BOLUS
1000.0000 mL | Freq: Once | INTRAVENOUS | Status: AC
  Administered 2019-07-24: 1000 mL via INTRAVENOUS

## 2019-07-24 MED ORDER — ONDANSETRON HCL 4 MG/2ML IJ SOLN
4.0000 mg | Freq: Once | INTRAMUSCULAR | Status: AC | PRN
  Administered 2019-07-24: 4 mg via INTRAVENOUS
  Filled 2019-07-24: qty 2

## 2019-07-24 MED ORDER — FAMOTIDINE IN NACL 20-0.9 MG/50ML-% IV SOLN
20.0000 mg | Freq: Once | INTRAVENOUS | Status: AC
  Administered 2019-07-24: 20 mg via INTRAVENOUS
  Filled 2019-07-24: qty 50

## 2019-07-24 MED ORDER — SODIUM CHLORIDE 0.9 % IV SOLN: INTRAVENOUS | Status: AC

## 2019-07-24 MED ORDER — GABAPENTIN 300 MG PO CAPS
300.0000 mg | ORAL_CAPSULE | Freq: Once | ORAL | Status: DC
  Filled 2019-07-24: qty 1

## 2019-07-24 MED ORDER — METRONIDAZOLE IN NACL 5-0.79 MG/ML-% IV SOLN
500.0000 mg | Freq: Three times a day (TID) | INTRAVENOUS | Status: DC
  Administered 2019-07-24 – 2019-07-25 (×3): 500 mg via INTRAVENOUS
  Filled 2019-07-24 (×3): qty 100

## 2019-07-24 MED ORDER — CIPROFLOXACIN IN D5W 400 MG/200ML IV SOLN
400.0000 mg | Freq: Once | INTRAVENOUS | Status: AC
  Administered 2019-07-24: 400 mg via INTRAVENOUS
  Filled 2019-07-24: qty 200

## 2019-07-24 MED ORDER — ONDANSETRON HCL 4 MG/2ML IJ SOLN
4.0000 mg | Freq: Once | INTRAMUSCULAR | Status: AC
  Administered 2019-07-24: 4 mg via INTRAVENOUS
  Filled 2019-07-24: qty 2

## 2019-07-24 MED ORDER — HYDRALAZINE HCL 20 MG/ML IJ SOLN
10.0000 mg | INTRAMUSCULAR | Status: DC | PRN
  Administered 2019-07-25: 10 mg via INTRAVENOUS
  Filled 2019-07-24: qty 1

## 2019-07-24 MED ORDER — METRONIDAZOLE IN NACL 5-0.79 MG/ML-% IV SOLN
500.0000 mg | Freq: Once | INTRAVENOUS | Status: AC
  Administered 2019-07-24: 500 mg via INTRAVENOUS
  Filled 2019-07-24: qty 100

## 2019-07-24 MED ORDER — FENTANYL CITRATE (PF) 100 MCG/2ML IJ SOLN
25.0000 ug | Freq: Once | INTRAMUSCULAR | Status: AC
  Administered 2019-07-24: 25 ug via INTRAVENOUS
  Filled 2019-07-24: qty 2

## 2019-07-24 NOTE — H&P (Signed)
History and Physical    Theodore Stevenson NAT:[id number] DOB: 1955/08/05 DOA: 07/24/2019  PCP: Patient, No Pcp Per  Patient coming from: Home.  Chief Complaint: Abdominal pain and diarrhea.  HPI: Theodore Stevenson is a 64 y.o. male with history of hypertension, spinal stenosis hepatitis presents to the ER for the second time with complaints of abdominal discomfort and diarrhea.  Patient had come on July 10 about 3 days ago for diarrhea and right lower quadrant pain at that time CT abdomen pelvis showed severe colitis involving the ascending colon was discharged home on oral antibiotics.  Despite taking which patient's diarrhea further worsen with abdominal discomfort no definite fever or chills.  But patient also started noticing some blood in his stools.  Patient states prior to the recent visit in the ER he had taken antibiotics about 2 months ago for a surgical procedure.  ED Course: In the ER patient had a repeat CT abdomen pelvis which shows diffuse colitis with ascites and also a pancreatic cyst.  Lab work is significant for leukocytosis of 21,000 with blood glucose of 201 and sodium of 134.  Patient was started on IV fluids and empiric antibiotics stool for occult blood was positive.  Admitted for further management of colitis.  Review of Systems: As per HPI, rest all negative.   Past Medical History:  Diagnosis Date  . Arthritis   . Hepatitis    1973  . Hypertension   . Spinal stenosis     Past Surgical History:  Procedure Laterality Date  . BACK SURGERY     1987  . CARPAL TUNNEL RELEASE     right and left  . HIP SURGERY  06/30/2011   total left hip  . JOINT REPLACEMENT     right total hip  . TOTAL HIP ARTHROPLASTY  06/30/2011   Procedure: TOTAL HIP ARTHROPLASTY;  Surgeon: Valeria Batman, MD;  Location: [facility] OR;  Service: Orthopedics;  Laterality: Left;     reports that he quit smoking about 13 years ago. His smoking use included cigarettes. He has a 10.00 pack-year  smoking history. His smokeless tobacco use includes chew. He reports that he does not drink alcohol and does not use drugs.  Allergies  Allergen Reactions  . Codeine Nausea And Vomiting  . Penicillins     Pt was young unsure exactly      History reviewed. No pertinent family history.  Prior to Admission medications   Medication Sig Start Date End Date Taking? Authorizing Provider  benazepril (LOTENSIN) 40 MG tablet Take 40 mg by mouth daily.   Yes [provider]  benzonatate (TESSALON) 100 MG capsule Take 100 mg by mouth 2 (two) times daily.  02/11/18  Yes [provider]  ezetimibe-simvastatin (VYTORIN) 10-40 MG per tablet Take 1 tablet by mouth daily.   Yes [provider]  gabapentin (NEURONTIN) 300 MG capsule Take 600 mg by mouth at bedtime.  07/01/19  Yes [provider]  lisinopril-hydrochlorothiazide (PRINZIDE,ZESTORETIC) 20-25 MG tablet Take 1.5 tablets by mouth daily.  03/27/15  Yes [provider]  ciprofloxacin (CIPRO) 500 MG tablet Take 1 tablet (500 mg total) by mouth every 12 (twelve) hours. 07/22/19   Raeford Razor, MD  diclofenac (VOLTAREN) 50 MG EC tablet Take 1 tablet (50 mg total) by mouth 2 (two) times daily. Patient not taking: Reported on 07/26/2017 04/14/16   [name] D, PA-C  methylPREDNISolone (MEDROL) 4 MG tablet DAY 1 TAKE 6 TABS, DAY 2 TAKE  5 TABS, DAY 3 TAKE 4 TABS, DAY 4 TAKE 3 TABS, DAY 5 TAKE 2 TABS, DAY 6 TAKE 1 TAB Patient not taking: Reported on 02/14/2018 07/26/17   Valeria BatmanWhitfield, Peter W, MD  metroNIDAZOLE (FLAGYL) 500 MG tablet Take 1 tablet (500 mg total) by mouth 3 (three) times daily. 07/22/19   Raeford RazorKohut, Stephen, MD  oxyCODONE-acetaminophen (PERCOCET/ROXICET) 5-325 MG tablet Take 1-2 tablets by mouth every 6 (six) hours as needed for severe pain. 07/22/19   Raeford RazorKohut, Stephen, MD    Physical Exam: Constitutional: Moderately built and nourished. Vitals:   07/24/19 1600 07/24/19 1645 07/24/19 1800 07/24/19 1913    BP: (!) 152/59 (!) 155/58 (!) 155/58 (!) 192/75  Pulse: 89 91 90 (!) 103  Resp: (!) 21 20 (!) 21 19  Temp:      TempSrc:      SpO2: 97% 95% 99% 99%   Eyes: Anicteric no pallor. ENMT: No discharge from the ears eyes nose or mouth. Neck: No mass felt.  No neck rigidity. Respiratory: No rhonchi or crepitations. Cardiovascular: S1-S2 heard. Abdomen: Soft nontender bowel sounds present. Musculoskeletal: No edema. Skin: No rash. Neurologic: Alert awake oriented to time place and person.  Moves all extremities. Psychiatric: Appears normal per normal affect.   Labs on Admission: I have personally reviewed following labs and imaging studies  CBC: Recent Labs  Lab 07/22/19 1237 07/24/19 1114  WBC 16.6* 21.3*  HGB 15.7 16.2  HCT 48.1 48.5  MCV 92.1 90.0  PLT 247 246   Basic Metabolic Panel: Recent Labs  Lab 07/22/19 1237 07/24/19 1114  NA 139 134*  K 4.1 3.9  CL 101 95*  CO2 25 24  GLUCOSE 146* 201*  BUN 20 29*  CREATININE 1.23 1.17  CALCIUM 8.8* 8.6*   GFR: Estimated Creatinine Clearance: 61.9 mL/min (by C-G formula based on SCr of 1.17 mg/dL). Liver Function Tests: Recent Labs  Lab 07/22/19 1237 07/24/19 1114  AST 22 21  ALT 18 14  ALKPHOS 84 74  BILITOT 0.5 0.5  PROT 7.3 6.7  ALBUMIN 4.0 3.5   Recent Labs  Lab 07/22/19 1237 07/24/19 1114  LIPASE 17 14   No results for input(s): AMMONIA in the last 168 hours. Coagulation Profile: No results for input(s): INR, PROTIME in the last 168 hours. Cardiac Enzymes: No results for input(s): CKTOTAL, CKMB, CKMBINDEX, TROPONINI in the last 168 hours. BNP (last 3 results) No results for input(s): PROBNP in the last 8760 hours. HbA1C: No results for input(s): HGBA1C in the last 72 hours. CBG: No results for input(s): GLUCAP in the last 168 hours. Lipid Profile: No results for input(s): CHOL, HDL, LDLCALC, TRIG, CHOLHDL, LDLDIRECT in the last 72 hours. Thyroid Function Tests: No results for input(s): TSH,  T4TOTAL, FREET4, T3FREE, THYROIDAB in the last 72 hours. Anemia Panel: No results for input(s): VITAMINB12, FOLATE, FERRITIN, TIBC, IRON, RETICCTPCT in the last 72 hours. Urine analysis:    Component Value Date/Time   COLORURINE YELLOW 07/24/2019 1350   APPEARANCEUR HAZY (A) 07/24/2019 1350   LABSPEC >1.030 (H) 07/24/2019 1350   PHURINE 5.5 07/24/2019 1350   GLUCOSEU 100 (A) 07/24/2019 1350   HGBUR TRACE (A) 07/24/2019 1350   BILIRUBINUR MODERATE (A) 07/24/2019 1350   KETONESUR 15 (A) 07/24/2019 1350   PROTEINUR 100 (A) 07/24/2019 1350   UROBILINOGEN 0.2 06/24/2011 1047   NITRITE NEGATIVE 07/24/2019 1350   LEUKOCYTESUR TRACE (A) 07/24/2019 1350   Sepsis Labs: @LABRCNTIP (procalcitonin:4,lacticidven:4) ) Recent Results (from the past 240 hour(s))  SARS  Coronavirus 2 by RT PCR (hospital order, performed in [facility] hospital lab) Nasopharyngeal Nasopharyngeal Swab     Status: None   Collection Time: 07/24/19 12:59 PM   Specimen: Nasopharyngeal Swab  Result Value Ref Range Status   SARS Coronavirus 2 NEGATIVE NEGATIVE Final    Comment: (NOTE) SARS-CoV-2 target nucleic acids are NOT DETECTED.  The SARS-CoV-2 RNA is generally detectable in upper and lower respiratory specimens during the acute phase of infection. The lowest concentration of SARS-CoV-2 viral copies this assay can detect is 250 copies / mL. A negative result does not preclude SARS-CoV-2 infection and should not be used as the sole basis for treatment or other patient management decisions.  A negative result may occur with improper specimen collection / handling, submission of specimen other than nasopharyngeal swab, presence of viral mutation(s) within the areas targeted by this assay, and inadequate number of viral copies (<250 copies / mL). A negative result must be combined with clinical observations, patient history, and epidemiological information.  Fact Sheet for Patients:     BoilerBrush.com.cy  Fact Sheet for Healthcare Providers: https://pope.com/  This test is not yet approved or  cleared by the [place] FDA and has been authorized for detection and/or diagnosis of SARS-CoV-2 by FDA under an Emergency Use Authorization (EUA).  This EUA will remain in effect (meaning this test can be used) for the duration of the COVID-19 declaration under Section 564(b)(1) of the Act, 21 U.S.C. section 360bbb-3(b)(1), unless the authorization is terminated or revoked sooner.  Performed at [facility], [address] [place], [place] [address]      Radiological Exams on Admission: CT ABDOMEN PELVIS W CONTRAST  Result Date: 07/24/2019 CLINICAL DATA:  Right lower quadrant pain with vomiting. EXAM: CT ABDOMEN AND PELVIS WITH CONTRAST TECHNIQUE: Multidetector CT imaging of the abdomen and pelvis was performed using the standard protocol following bolus administration of intravenous contrast. CONTRAST:  OMNIPAQUE IOHEXOL 300 MG/ML  SOLN COMPARISON:  None FINDINGS: Lower chest: No acute abnormality. Hepatobiliary: No focal liver abnormality. Mild gallbladder distension. No gallbladder wall thickening. No biliary ductal dilatation. Pancreas: Small cystic lesion within head of pancreas measures 0.8 cm, image 35/2. No main duct dilatation, solid enhancing mass or pancreatic inflammation. Spleen: Small cyst within spleen measures 5 mm. Spleen is otherwise unremarkable. Adrenals/Urinary Tract: Normal adrenal glands. The kidneys are unremarkable. No mass or hydronephrosis. There is beam hardening artifact from bilateral hip arthroplasty devices. This diminishes exam detail at the level of the urinary bladder. Stomach/Bowel: Stomach appears normal. The small bowel loops have a normal course and caliber without wall thickening, inflammation or distension. The appendix is visualized and appears normal. Progressive, diffuse  colonic wall edema and inflammation is identified compatible with pancolitis. No convincing evidence for pneumatosis or bowel perforation. Distal colonic diverticula identified. Vascular/Lymphatic: The portal vein and superior mesenteric vein appear patent. Aortic atherosclerosis without aneurysm. No abdominopelvic adenopathy. Reproductive: Prostate is unremarkable. Large left hydrocele and small right hydrocele identified. Other: Small to moderate volume of ascites identified within the abdomen and pelvis. No focal fluid collections identified. Musculoskeletal: Status post bilateral hip arthroplasty. Lumbar degenerative disc disease. IMPRESSION: 1. Progressive, diffuse colonic wall edema and inflammation compatible with pancolitis. No convincing evidence for pneumatosis or bowel perforation. 2. Small to moderate volume of ascites identified within the abdomen and pelvis. 3. Aortic atherosclerosis. 4. Large left hydrocele. 5. 8 mm cystic lesion noted within head of pancreas, nonspecific. Follow-up imaging in 24 months with  pancreas protocol MRI or CT without and with contrast material is advised. This recommendation follows ACR consensus guidelines: Management of Incidental Pancreatic Cysts: A White Paper of the ACR Incidental Findings Committee. J Am Coll Radiol 2017;14:911-923. Aortic Atherosclerosis (ICD10-I70.0). Electronically Signed   By: Signa Kell M.D.   On: 07/24/2019 13:54    EKG: Independently reviewed.  Normal sinus rhythm.  Assessment/Plan Principal Problem:   Colitis Active Problems:   Hypertension   Hepatitis    1. Diffuse colitis with bloody diarrhea could be either infectious or inflammatory.  Denies any recent travel or sick contacts.  No antibiotics prior to the symptoms starting was 2 months ago.  Stool for C. difficile was negative.  GI pathogen panel is pending.  Continue with Cipro Flagyl IV fluids. 2. Pancreatic cyst will need follow-up. 3. Hyperglycemia check hemoglobin  A1c. 4. Leukocytosis likely from colitis. 5. Hypertension we will keep patient on as needed IV hydralazine for now. 6. Bloody diarrhea follow serial CBCs.  Since patient has significant diffuse colitis with bloody diarrhea will need close monitoring for any further worsening in inpatient status.   DVT prophylaxis: SCDs for now.  Will avoid anticoagulation for now due to bloody diarrhea. Code Status: Full code.  Family Communication: Discussed with patient. Disposition Plan: Home. Consults called: None. Admission status: Inpatient.   Eduard Clos MD [facility] Pager [phone].  If 7PM-7AM, please contact night-coverage [email or web address] Password [facility]  07/24/2019, 10:16 PM

## 2019-07-24 NOTE — ED Provider Notes (Signed)
[facility] EMERGENCY DEPARTMENT Provider Note   CSN: [id number] Arrival date & time: 07/24/19  1031     History Chief Complaint  Patient presents with  . Emesis  . Rectal Bleeding    Theodore Stevenson is a 64 y.o. male.  HPI   Pt is a 63 y/o M with a h/o hepatitis, HTN, spinal stenosis, who presents to the ED today for eval of abd pain, nausea, vomiting and diarrhea. Pain located to RLQ of the abd and it is intermittent in nature. Started having bright red bloody/tarry stools. States every bowel movement is bloody. Denies fevers, urinary sxs. He is also c/o heartburn. Denies shortness of breath.   Denies any recent abx use other than the current abx that he is on. States he has not been able to keep anything down including his medications.   Past Medical History:  Diagnosis Date  . Arthritis   . Hepatitis    1973  . Hypertension   . Spinal stenosis     Patient Active Problem List   Diagnosis Date Noted  . Chronic left-sided low back pain with left-sided sciatica 02/14/2018  . Avascular necrosis of hip (HCC) 07/02/2011  . Hypertension 07/02/2011  . Hepatitis 07/02/2011  . Postoperative anemia due to acute blood loss 07/02/2011    Past Surgical History:  Procedure Laterality Date  . BACK SURGERY     1987  . CARPAL TUNNEL RELEASE     right and left  . HIP SURGERY  06/30/2011   total left hip  . JOINT REPLACEMENT     right total hip  . TOTAL HIP ARTHROPLASTY  06/30/2011   Procedure: TOTAL HIP ARTHROPLASTY;  Surgeon: Valeria Batman, MD;  Location: [facility] OR;  Service: Orthopedics;  Laterality: Left;       No family history on file.  Social History   Tobacco Use  . Smoking status: Former Smoker    Packs/day: 1.00    Years: 10.00    Pack years: 10.00    Types: Cigarettes    Quit date: 06/22/2006    Years since quitting: 13.0  . Smokeless tobacco: Current User    Types: Chew  Vaping Use  . Vaping Use: Never used  Substance Use Topics  . Alcohol  use: No  . Drug use: No    Home Medications Prior to Admission medications   Medication Sig Start Date End Date Taking? Authorizing Provider  benazepril (LOTENSIN) 40 MG tablet Take 40 mg by mouth daily.    [provider]  benzonatate (TESSALON) 100 MG capsule  02/11/18   [provider]  ciprofloxacin (CIPRO) 500 MG tablet Take 1 tablet (500 mg total) by mouth every 12 (twelve) hours. 07/22/19   Raeford Razor, MD  diclofenac (VOLTAREN) 50 MG EC tablet Take 1 tablet (50 mg total) by mouth 2 (two) times daily. Patient not taking: Reported on 07/26/2017 04/14/16   Jetty Peeks, PA-C  ezetimibe-simvastatin (VYTORIN) 10-40 MG per tablet Take 1 tablet by mouth daily.    [provider]  lisinopril-hydrochlorothiazide (PRINZIDE,ZESTORETIC) 20-25 MG tablet TAKE 1.5 TABLETS BY MOUTH EVERY DAY 03/27/15   [provider]  methylPREDNISolone (MEDROL) 4 MG tablet DAY 1 TAKE 6 TABS, DAY 2 TAKE 5 TABS, DAY 3 TAKE 4 TABS, DAY 4 TAKE 3 TABS, DAY 5 TAKE 2 TABS, DAY 6 TAKE 1 TAB Patient not taking: Reported on 02/14/2018 07/26/17   Valeria Batman, MD  metroNIDAZOLE (FLAGYL) 500 MG tablet Take 1  tablet (500 mg total) by mouth 3 (three) times daily. 07/22/19   Raeford Razor, MD  oxyCODONE-acetaminophen (PERCOCET/ROXICET) 5-325 MG tablet Take 1-2 tablets by mouth every 6 (six) hours as needed for severe pain. 07/22/19   Raeford Razor, MD    Allergies    Codeine and Penicillins  Review of Systems   Review of Systems  Constitutional: Negative for fever.  HENT: Negative for ear pain and sore throat.   Eyes: Negative for visual disturbance.  Respiratory: Negative for cough and shortness of breath.   Cardiovascular: Negative for chest pain.  Gastrointestinal: Positive for abdominal pain, blood in stool, diarrhea, nausea and vomiting.  Genitourinary: Negative for dysuria and hematuria.  Musculoskeletal: Negative for back pain.  Skin: Negative for rash.  Neurological:  Negative for seizures and syncope.  All other systems reviewed and are negative.   Physical Exam Updated Vital Signs BP 138/60   Pulse 85   Temp 98.6 F (37 C) (Oral)   Resp 18   SpO2 98%   Physical Exam Vitals and nursing note reviewed.  Constitutional:      Appearance: He is well-developed.  HENT:     Head: Normocephalic and atraumatic.  Eyes:     Conjunctiva/sclera: Conjunctivae normal.  Cardiovascular:     Rate and Rhythm: Normal rate and regular rhythm.     Pulses: Normal pulses.     Heart sounds: Normal heart sounds. No murmur heard.   Pulmonary:     Effort: Pulmonary effort is normal. No respiratory distress.     Breath sounds: Normal breath sounds. No wheezing, rhonchi or rales.  Abdominal:     General: Bowel sounds are normal. There is no distension.     Palpations: Abdomen is soft.     Tenderness: There is abdominal tenderness (RLQ). There is no guarding or rebound.  Genitourinary:    Comments: DRE with bright red blood. No melena.  Musculoskeletal:     Cervical back: Neck supple.  Skin:    General: Skin is warm and dry.  Neurological:     Mental Status: He is alert.     ED Results / Procedures / Treatments   Labs (all labs ordered are listed, but only abnormal results are displayed) Labs Reviewed  COMPREHENSIVE METABOLIC PANEL - Abnormal; Notable for the following components:      Result Value   Sodium 134 (*)    Chloride 95 (*)    Glucose, Bld 201 (*)    BUN 29 (*)    Calcium 8.6 (*)    All other components within normal limits  CBC - Abnormal; Notable for the following components:   WBC 21.3 (*)    All other components within normal limits  URINALYSIS, ROUTINE W REFLEX MICROSCOPIC - Abnormal; Notable for the following components:   APPearance HAZY (*)    Specific Gravity, Urine >1.030 (*)    Glucose, UA 100 (*)    Hgb urine dipstick TRACE (*)    Bilirubin Urine MODERATE (*)    Ketones, ur 15 (*)    Protein, ur 100 (*)    Leukocytes,Ua  TRACE (*)    All other components within normal limits  LACTIC ACID, PLASMA - Abnormal; Notable for the following components:   Lactic Acid, Venous 2.5 (*)    All other components within normal limits  URINALYSIS, MICROSCOPIC (REFLEX) - Abnormal; Notable for the following components:   Bacteria, UA FEW (*)    All other components within normal limits  OCCULT BLOOD X  1 CARD TO LAB, STOOL - Abnormal; Notable for the following components:   Fecal Occult Bld POSITIVE (*)    All other components within normal limits  SARS CORONAVIRUS 2 BY RT PCR (HOSPITAL ORDER, PERFORMED IN [facility])  GASTROINTESTINAL PANEL BY PCR, STOOL (REPLACES STOOL CULTURE)  LIPASE, BLOOD  LACTIC ACID, PLASMA  TROPONIN I (HIGH SENSITIVITY)  TROPONIN I (HIGH SENSITIVITY)    EKG None  Radiology CT ABDOMEN PELVIS W CONTRAST  Result Date: 07/24/2019 CLINICAL DATA:  Right lower quadrant pain with vomiting. EXAM: CT ABDOMEN AND PELVIS WITH CONTRAST TECHNIQUE: Multidetector CT imaging of the abdomen and pelvis was performed using the standard protocol following bolus administration of intravenous contrast. CONTRAST:  OMNIPAQUE IOHEXOL 300 MG/ML  SOLN COMPARISON:  None FINDINGS: Lower chest: No acute abnormality. Hepatobiliary: No focal liver abnormality. Mild gallbladder distension. No gallbladder wall thickening. No biliary ductal dilatation. Pancreas: Small cystic lesion within head of pancreas measures 0.8 cm, image 35/2. No main duct dilatation, solid enhancing mass or pancreatic inflammation. Spleen: Small cyst within spleen measures 5 mm. Spleen is otherwise unremarkable. Adrenals/Urinary Tract: Normal adrenal glands. The kidneys are unremarkable. No mass or hydronephrosis. There is beam hardening artifact from bilateral hip arthroplasty devices. This diminishes exam detail at the level of the urinary bladder. Stomach/Bowel: Stomach appears normal. The small bowel loops have a normal course and caliber  without wall thickening, inflammation or distension. The appendix is visualized and appears normal. Progressive, diffuse colonic wall edema and inflammation is identified compatible with pancolitis. No convincing evidence for pneumatosis or bowel perforation. Distal colonic diverticula identified. Vascular/Lymphatic: The portal vein and superior mesenteric vein appear patent. Aortic atherosclerosis without aneurysm. No abdominopelvic adenopathy. Reproductive: Prostate is unremarkable. Large left hydrocele and small right hydrocele identified. Other: Small to moderate volume of ascites identified within the abdomen and pelvis. No focal fluid collections identified. Musculoskeletal: Status post bilateral hip arthroplasty. Lumbar degenerative disc disease. IMPRESSION: 1. Progressive, diffuse colonic wall edema and inflammation compatible with pancolitis. No convincing evidence for pneumatosis or bowel perforation. 2. Small to moderate volume of ascites identified within the abdomen and pelvis. 3. Aortic atherosclerosis. 4. Large left hydrocele. 5. 8 mm cystic lesion noted within head of pancreas, nonspecific. Follow-up imaging in 24 months with pancreas protocol MRI or CT without and with contrast material is advised. This recommendation follows ACR consensus guidelines: Management of Incidental Pancreatic Cysts: A White Paper of the ACR Incidental Findings Committee. J Am Coll Radiol 2017;14:911-923. Aortic Atherosclerosis (ICD10-I70.0). Electronically Signed   By: Signa Kell M.D.   On: 07/24/2019 13:54    Procedures Procedures (including critical care time)  Medications Ordered in ED Medications  alum & mag hydroxide-simeth (MAALOX/MYLANTA) 200-200-20 MG/5ML suspension 30 mL (0 mLs Oral Hold 07/24/19 1427)  0.9 %  sodium chloride infusion (500 mLs Intravenous New Bag/Given 07/24/19 1304)  ciprofloxacin (CIPRO) IVPB 400 mg (400 mg Intravenous New Bag/Given 07/24/19 1440)  metroNIDAZOLE (FLAGYL) IVPB 500 mg  (has no administration in time range)  ondansetron (ZOFRAN) injection 4 mg (4 mg Intravenous Given 07/24/19 1115)  metoCLOPramide (REGLAN) injection 5 mg (5 mg Intravenous Given 07/24/19 1306)  morphine 4 MG/ML injection 4 mg (4 mg Intravenous Given 07/24/19 1309)  famotidine (PEPCID) IVPB 20 mg premix ( Intravenous Stopped 07/24/19 1417)  iohexol (OMNIPAQUE) 300 MG/ML solution 100 mL (100 mLs Intravenous Contrast Given 07/24/19 1329)  sodium chloride 0.9 % bolus 1,000 mL (1,000 mLs Intravenous New Bag/Given 07/24/19 1351)    ED  Course  I have reviewed the triage vital signs and the nursing notes.  Pertinent labs & imaging results that were available during my care of the patient were reviewed by me and considered in my medical decision making (see chart for details).    MDM Rules/Calculators/A&P                          64 year old male presenting for evaluation of colitis.  Was diagnosed with this 2 days ago but symptoms are worsening and now having bloody stools.  Cannot keep any medications down at home.  Reviewed/interpreted labs CBC with leukocytosis at 21,000, no anemia CMP with elevated BUN at 29, normal creatinine and liver enzymes. Lipase within normal limits Troponin negative Lactic acid mildly elevated UA with glucosuria, hematuria, bilirubinuria, ketonuria proteinuria and leukocytes. Hemoccult with bright red blood  CT abd/pelvis with progressive, diffuse colonic wall edema and inflammation compatible with pancolitis. No convincing evidence for pneumatosis or bowel perforation. 2. Small to moderate volume of ascites identified within the abdomen and pelvis. 3. Aortic atherosclerosis. 4. Large left hydrocele. 5. 8 mm cystic lesion noted within head of pancreas, nonspecific.    -Patient made aware of incidental findings on CT and need for PCP follow-up following discharge.  Pt given IVF, pain meds, antiemetics, antacid, and abx. Will admit for failed outpatient tx.  2:35 PM  CONS[facility]irguis[facility]    Final Clinical Impression(s) / ED Diagnoses Final diagnoses:  Colitis    Rx / DC Orders ED Discharge Orders    None       Karrie MeresCouture, Jaydn Moscato S, PA-C 07/24/19 1920    Melene PlanFloyd, Dan, DO 07/25/19 1504

## 2019-07-24 NOTE — ED Triage Notes (Addendum)
RLQ with vomiting. Seen last week and dx with colitis. Symptoms continue. Actively vomiting in triage.

## 2019-07-25 LAB — CBC
HCT: 42.5 % (ref 39.0–52.0)
Hemoglobin: 14.3 g/dL (ref 13.0–17.0)
MCH: 30.8 pg (ref 26.0–34.0)
MCHC: 33.6 g/dL (ref 30.0–36.0)
MCV: 91.6 fL (ref 80.0–100.0)
Platelets: 236 10*3/uL (ref 150–400)
RBC: 4.64 MIL/uL (ref 4.22–5.81)
RDW: 12.6 % (ref 11.5–15.5)
WBC: 19.2 10*3/uL — ABNORMAL HIGH (ref 4.0–10.5)
nRBC: 0 % (ref 0.0–0.2)

## 2019-07-25 LAB — HEPATIC FUNCTION PANEL
ALT: 14 U/L (ref 0–44)
AST: 19 U/L (ref 15–41)
Albumin: 3 g/dL — ABNORMAL LOW (ref 3.5–5.0)
Alkaline Phosphatase: 52 U/L (ref 38–126)
Bilirubin, Direct: 0.1 mg/dL (ref 0.0–0.2)
Indirect Bilirubin: 0.6 mg/dL (ref 0.3–0.9)
Total Bilirubin: 0.7 mg/dL (ref 0.3–1.2)
Total Protein: 5.5 g/dL — ABNORMAL LOW (ref 6.5–8.1)

## 2019-07-25 LAB — GASTROINTESTINAL PANEL BY PCR, STOOL (REPLACES STOOL CULTURE)
Adenovirus F40/41: NOT DETECTED
Astrovirus: NOT DETECTED
Campylobacter species: NOT DETECTED
Cryptosporidium: NOT DETECTED
Cyclospora cayetanensis: NOT DETECTED
E. coli O157: DETECTED — AB
Entamoeba histolytica: NOT DETECTED
Enteroaggregative E coli (EAEC): NOT DETECTED
Enterotoxigenic E coli (ETEC): NOT DETECTED
Giardia lamblia: NOT DETECTED
Norovirus GI/GII: NOT DETECTED
Plesimonas shigelloides: NOT DETECTED
Rotavirus A: NOT DETECTED
Salmonella species: NOT DETECTED
Sapovirus (I, II, IV, and V): NOT DETECTED
Shiga like toxin producing E coli (STEC): DETECTED — AB
Shigella/Enteroinvasive E coli (EIEC): NOT DETECTED
Vibrio cholerae: NOT DETECTED
Vibrio species: NOT DETECTED
Yersinia enterocolitica: NOT DETECTED

## 2019-07-25 LAB — HIV ANTIBODY (ROUTINE TESTING W REFLEX): HIV Screen 4th Generation wRfx: NONREACTIVE

## 2019-07-25 LAB — HEMOGLOBIN A1C
Hgb A1c MFr Bld: 5.7 % — ABNORMAL HIGH (ref 4.8–5.6)
Mean Plasma Glucose: 116.89 mg/dL

## 2019-07-25 LAB — BASIC METABOLIC PANEL
Anion gap: 8 (ref 5–15)
BUN: 33 mg/dL — ABNORMAL HIGH (ref 8–23)
CO2: 26 mmol/L (ref 22–32)
Calcium: 7.8 mg/dL — ABNORMAL LOW (ref 8.9–10.3)
Chloride: 100 mmol/L (ref 98–111)
Creatinine, Ser: 1.22 mg/dL (ref 0.61–1.24)
GFR calc Af Amer: >60 mL/min (ref >60)
GFR calc non Af Amer: >60 mL/min (ref >60)
Glucose, Bld: 156 mg/dL — ABNORMAL HIGH (ref 70–99)
Potassium: 3.8 mmol/L (ref 3.5–5.1)
Sodium: 134 mmol/L — ABNORMAL LOW (ref 135–145)

## 2019-07-25 LAB — TYPE AND SCREEN
ABO/RH(D): O POS
Antibody Screen: NEGATIVE

## 2019-07-25 LAB — ABO/RH: ABO/RH(D): O POS

## 2019-07-25 MED ORDER — ONDANSETRON HCL 4 MG PO TABS: 4.0000 mg | ORAL_TABLET | ORAL | Status: DC | PRN

## 2019-07-25 MED ORDER — ONDANSETRON HCL 4 MG/2ML IJ SOLN
4.0000 mg | INTRAMUSCULAR | Status: DC | PRN
  Administered 2019-07-25 (×2): 4 mg via INTRAVENOUS
  Filled 2019-07-25 (×2): qty 2

## 2019-07-25 MED ORDER — PROCHLORPERAZINE EDISYLATE 10 MG/2ML IJ SOLN
10.0000 mg | Freq: Four times a day (QID) | INTRAMUSCULAR | Status: DC | PRN
  Administered 2019-07-25 – 2019-07-26 (×2): 10 mg via INTRAVENOUS
  Filled 2019-07-25 (×2): qty 2

## 2019-07-25 MED ORDER — CIPROFLOXACIN IN D5W 400 MG/200ML IV SOLN
400.0000 mg | Freq: Two times a day (BID) | INTRAVENOUS | Status: DC
  Administered 2019-07-25 (×2): 400 mg via INTRAVENOUS
  Filled 2019-07-25 (×2): qty 200

## 2019-07-25 NOTE — Progress Notes (Signed)
Pharmacy Antibiotic Note  Theodore Stevenson is a 64 y.o. male admitted on 07/24/2019 with Intra-abdominal infection.  Pharmacy has been consulted for Ciprofloxacin dosing.  Plan: Ciprofloxacin 400mg  iv q12hr  Height: 5\' 5"  (165.1 cm) Weight: 81.4 kg (179 lb 7.3 oz) IBW/kg (Calculated) : 61.5  Temp (24hrs), Avg:98.6 F (37 C), Min:98.3 F (36.8 C), Max:98.9 F (37.2 C)  Recent Labs  Lab 07/22/19 1237 07/24/19 1114 07/24/19 1255 07/24/19 1645 07/24/19 2256 07/25/19 0213  WBC 16.6* 21.3*  --   --  20.9* 19.2*  CREATININE 1.23 1.17  --   --   --  1.22  LATICACIDVEN  --   --  2.5* 1.5  --   --     Estimated Creatinine Clearance: 58.5 mL/min (by C-G formula based on SCr of 1.22 mg/dL).    Allergies  Allergen Reactions  . Codeine Nausea And Vomiting  . Penicillins     Pt was young unsure exactly      Antimicrobials this admission: Ciprofloxacin 07/24/2019 >> Flagyl 07/24/2019 >>   Dose adjustments this admission: -  Microbiology results: -  Thank you for allowing pharmacy to be a part of this patient's care.  09/24/2019 [place] 07/25/2019 6:23 AM

## 2019-07-25 NOTE — Progress Notes (Addendum)
PROGRESS NOTE    Theodore Stevenson  OIN:[id number] DOB: [birth year] DOA: 07/24/2019 PCP: Patient, No Pcp Per   Brief Narrative:  Theodore Stevenson is a 64 y.o. male with history of hypertension, spinal stenosis hepatitis presents to the ER for the second time with complaints of abdominal discomfort and diarrhea.  Patient had come on July 10 about 3 days ago for diarrhea and right lower quadrant pain at that time CT abdomen pelvis showed severe colitis involving the ascending colon was discharged home on oral antibiotics.  Despite taking which patient's diarrhea further worsen with abdominal discomfort no definite fever or chills.  But patient also started noticing some blood in his stools.  Patient states prior to the recent visit in the ER he had taken antibiotics about 2 months ago for a surgical procedure. In the ER patient had a repeat CT abdomen pelvis which shows diffuse colitis with ascites and also a pancreatic cyst.  Lab work is significant for leukocytosis of 21,000 with blood glucose of 201 and sodium of 134.  Patient was started on IV fluids and empiric antibiotics stool for occult blood was positive.  Admitted for further management of colitis.  Assessment & Plan:   Principal Problem:   Colitis Active Problems:   Hypertension   Hepatitis   Acute sepsis in the setting of E. coli 0157 colitis with Hemoccult positive stool.  POA. - Patient meets sepsis criteria given leukocytosis tachycardia and notable infectious diarrhea - Stop Cipro/Flagyl, cultures remarkable for E. coli 0157, hold antibiotics in hopes to decrease likelihood of hemolytic uremic syndrome  Hypertension, essential - Resume home medications once able to tolerate p.o. safely  Pancreatic cyst - Incidentally noted on imaging, outpatient follow-up and serial exams per guidelines with PCP  Hyperglycemia, transient Lab Results  Component Value Date   HGBA1C 5.7 (H) 07/25/2019    DVT prophylaxis: SCDs for now.   Will avoid anticoagulation for now due to bloody diarrhea. Code Status: Full code.  Family Communication: At bedside Status is: Inpatient Dispo: The patient is from: Home              Anticipated d/c is to: Home              Anticipated d/c date is: 48 to 72 hours              Patient currently not medically stable for discharge given ongoing infectious diarrhea with notable bloody component.  Consultants:   None  Procedures:   None planned  Antimicrobials:  Cipro, Flagyl  Subjective: No acute issues or events overnight, patient's only complaint this morning is ongoing nausea, abdominal pain moderately improving.  Denies headache, fever, chills, constipation, shortness of breath, chest pain.  Objective: Vitals:   07/24/19 2235 07/24/19 2244 07/25/19 0146 07/25/19 0555  BP: (!) 168/71 (!) 168/71 (!) 156/76 (!) 169/74  Pulse: 76 76 74 82  Resp: 18 18 18    Temp: 98.9 F (37.2 C) 98.9 F (37.2 C) 98.3 F (36.8 C) 98.3 F (36.8 C)  TempSrc: Oral Oral Oral Oral  SpO2: 98%  98% 98%  Weight:      Height:        Intake/Output Summary (Last 24 hours) at 07/25/2019 0809 Last data filed at 07/25/2019 0700 Gross per 24 hour  Intake 2225.52 ml  Output --  Net 2225.52 ml   Filed Weights   07/24/19 2000  Weight: 81.4 kg    Examination:  General:  Pleasantly resting in bed,  No acute distress. HEENT:  Normocephalic atraumatic.  Sclerae nonicteric, noninjected.  Extraocular movements intact bilaterally. Neck:  Without mass or deformity.  Trachea is midline. Lungs:  Clear to auscultate bilaterally without rhonchi, wheeze, or rales. Heart:  Regular rate and rhythm.  Without murmurs, rubs, or gallops. Abdomen:  Soft, nontender, nondistended.  Without guarding or rebound. Extremities: Without cyanosis, clubbing, edema, or obvious deformity. Vascular:  Dorsalis pedis and posterior tibial pulses palpable bilaterally. Skin:  Warm and dry, no erythema, no ulcerations.  Data  Reviewed: I have personally reviewed following labs and imaging studies  CBC: Recent Labs  Lab 07/22/19 1237 07/24/19 1114 07/24/19 2256 07/25/19 0213  WBC 16.6* 21.3* 20.9* 19.2*  HGB 15.7 16.2 14.4 14.3  HCT 48.1 48.5 42.2 42.5  MCV 92.1 90.0 91.3 91.6  PLT 247 246 247 236   Basic Metabolic Panel: Recent Labs  Lab 07/22/19 1237 07/24/19 1114 07/25/19 0213  NA 139 134* 134*  K 4.1 3.9 3.8  CL 101 95* 100  CO2 25 24 26   GLUCOSE 146* 201* 156*  BUN 20 29* 33*  CREATININE 1.23 1.17 1.22  CALCIUM 8.8* 8.6* 7.8*   GFR: Estimated Creatinine Clearance: 58.5 mL/min (by C-G formula based on SCr of 1.22 mg/dL). Liver Function Tests: Recent Labs  Lab 07/22/19 1237 07/24/19 1114 07/25/19 0213  AST 22 21 19   ALT 18 14 14   ALKPHOS 84 74 52  BILITOT 0.5 0.5 0.7  PROT 7.3 6.7 5.5*  ALBUMIN 4.0 3.5 3.0*   Recent Labs  Lab 07/22/19 1237 07/24/19 1114  LIPASE 17 14   No results for input(s): AMMONIA in the last 168 hours. Coagulation Profile: No results for input(s): INR, PROTIME in the last 168 hours. Cardiac Enzymes: No results for input(s): CKTOTAL, CKMB, CKMBINDEX, TROPONINI in the last 168 hours. BNP (last 3 results) No results for input(s): PROBNP in the last 8760 hours. HbA1C: No results for input(s): HGBA1C in the last 72 hours. CBG: No results for input(s): GLUCAP in the last 168 hours. Lipid Profile: No results for input(s): CHOL, HDL, LDLCALC, TRIG, CHOLHDL, LDLDIRECT in the last 72 hours. Thyroid Function Tests: No results for input(s): TSH, T4TOTAL, FREET4, T3FREE, THYROIDAB in the last 72 hours. Anemia Panel: No results for input(s): VITAMINB12, FOLATE, FERRITIN, TIBC, IRON, RETICCTPCT in the last 72 hours. Sepsis Labs: Recent Labs  Lab 07/24/19 1255 07/24/19 1645  LATICACIDVEN 2.5* 1.5    Recent Results (from the past 240 hour(s))  SARS Coronavirus 2 by RT PCR (hospital order, performed in [facility] hospital lab) Nasopharyngeal  Nasopharyngeal Swab     Status: None   Collection Time: 07/24/19 12:59 PM   Specimen: Nasopharyngeal Swab  Result Value Ref Range Status   SARS Coronavirus 2 NEGATIVE NEGATIVE Final    Comment: (NOTE) SARS-CoV-2 target nucleic acids are NOT DETECTED.  The SARS-CoV-2 RNA is generally detectable in upper and lower respiratory specimens during the acute phase of infection. The lowest concentration of SARS-CoV-2 viral copies this assay can detect is 250 copies / mL. A negative result does not preclude SARS-CoV-2 infection and should not be used as the sole basis for treatment or other patient management decisions.  A negative result may occur with improper specimen collection / handling, submission of specimen other than nasopharyngeal swab, presence of viral mutation(s) within the areas targeted by this assay, and inadequate number of viral copies (<250 copies / mL). A negative result must be combined with clinical observations, patient history, and epidemiological information.  Fact Sheet for Patients:   BoilerBrush.com.cy  Fact Sheet for Healthcare Providers: https://pope.com/  This test is not yet approved or  cleared by the [place] FDA and has been authorized for detection and/or diagnosis of SARS-CoV-2 by FDA under an Emergency Use Authorization (EUA).  This EUA will remain in effect (meaning this test can be used) for the duration of the COVID-19 declaration under Section 564(b)(1) of the Act, 21 U.S.C. section 360bbb-3(b)(1), unless the authorization is terminated or revoked sooner.  Performed at [facility], [address], [place], [place] [address]     Radiology Studies: CT ABDOMEN PELVIS W CONTRAST  Result Date: 07/24/2019 CLINICAL DATA:  Right lower quadrant pain with vomiting. EXAM: CT ABDOMEN AND PELVIS WITH CONTRAST TECHNIQUE: Multidetector CT imaging of the abdomen and pelvis was performed using the  standard protocol following bolus administration of intravenous contrast. CONTRAST:  OMNIPAQUE IOHEXOL 300 MG/ML  SOLN COMPARISON:  None FINDINGS: Lower chest: No acute abnormality. Hepatobiliary: No focal liver abnormality. Mild gallbladder distension. No gallbladder wall thickening. No biliary ductal dilatation. Pancreas: Small cystic lesion within head of pancreas measures 0.8 cm, image 35/2. No main duct dilatation, solid enhancing mass or pancreatic inflammation. Spleen: Small cyst within spleen measures 5 mm. Spleen is otherwise unremarkable. Adrenals/Urinary Tract: Normal adrenal glands. The kidneys are unremarkable. No mass or hydronephrosis. There is beam hardening artifact from bilateral hip arthroplasty devices. This diminishes exam detail at the level of the urinary bladder. Stomach/Bowel: Stomach appears normal. The small bowel loops have a normal course and caliber without wall thickening, inflammation or distension. The appendix is visualized and appears normal. Progressive, diffuse colonic wall edema and inflammation is identified compatible with pancolitis. No convincing evidence for pneumatosis or bowel perforation. Distal colonic diverticula identified. Vascular/Lymphatic: The portal vein and superior mesenteric vein appear patent. Aortic atherosclerosis without aneurysm. No abdominopelvic adenopathy. Reproductive: Prostate is unremarkable. Large left hydrocele and small right hydrocele identified. Other: Small to moderate volume of ascites identified within the abdomen and pelvis. No focal fluid collections identified. Musculoskeletal: Status post bilateral hip arthroplasty. Lumbar degenerative disc disease. IMPRESSION: 1. Progressive, diffuse colonic wall edema and inflammation compatible with pancolitis. No convincing evidence for pneumatosis or bowel perforation. 2. Small to moderate volume of ascites identified within the abdomen and pelvis. 3. Aortic atherosclerosis. 4. Large left  hydrocele. 5. 8 mm cystic lesion noted within head of pancreas, nonspecific. Follow-up imaging in 24 months with pancreas protocol MRI or CT without and with contrast material is advised. This recommendation follows ACR consensus guidelines: Management of Incidental Pancreatic Cysts: A White Paper of the ACR Incidental Findings Committee. J Am Coll Radiol 2017;14:911-923. Aortic Atherosclerosis (ICD10-I70.0). Electronically Signed   By: Signa Kell M.D.   On: 07/24/2019 13:54   Scheduled Meds: . gabapentin  600 mg Oral QHS   Continuous Infusions: . sodium chloride 125 mL/hr at 07/24/19 2350  . ciprofloxacin 400 mg (07/25/19 0109)  . metronidazole 500 mg (07/25/19 0544)     LOS: 1 day    Time spent:   Azucena Fallen, DO [facility]  If 7PM-7AM, please contact night-coverage [email or web address]  07/25/2019, 8:09 AM

## 2019-07-25 NOTE — Progress Notes (Signed)
CRITICAL VALUE ALERT  Critical Value:  GI pathogen panel positive = shiga like toxin producing E. Coli and E. Coli 0157  Date & Time Notied:  07/25/2019 1347  Provider Notified: Dr. Natale Milch  Orders Received/Actions taken:

## 2019-07-25 NOTE — Plan of Care (Signed)

## 2019-07-26 ENCOUNTER — Inpatient Hospital Stay (HOSPITAL_COMMUNITY): Payer: Medicare Other

## 2019-07-26 LAB — COMPREHENSIVE METABOLIC PANEL
ALT: 25 U/L (ref 0–44)
AST: 36 U/L (ref 15–41)
Albumin: 3 g/dL — ABNORMAL LOW (ref 3.5–5.0)
Alkaline Phosphatase: 55 U/L (ref 38–126)
Anion gap: 9 (ref 5–15)
BUN: 32 mg/dL — ABNORMAL HIGH (ref 8–23)
CO2: 24 mmol/L (ref 22–32)
Calcium: 7.9 mg/dL — ABNORMAL LOW (ref 8.9–10.3)
Chloride: 104 mmol/L (ref 98–111)
Creatinine, Ser: 1.06 mg/dL (ref 0.61–1.24)
GFR calc Af Amer: >60 mL/min (ref >60)
GFR calc non Af Amer: >60 mL/min (ref >60)
Glucose, Bld: 119 mg/dL — ABNORMAL HIGH (ref 70–99)
Potassium: 3.4 mmol/L — ABNORMAL LOW (ref 3.5–5.1)
Sodium: 137 mmol/L (ref 135–145)
Total Bilirubin: 0.4 mg/dL (ref 0.3–1.2)
Total Protein: 5.5 g/dL — ABNORMAL LOW (ref 6.5–8.1)

## 2019-07-26 LAB — CBC
HCT: 42.1 % (ref 39.0–52.0)
Hemoglobin: 14 g/dL (ref 13.0–17.0)
MCH: 30.4 pg (ref 26.0–34.0)
MCHC: 33.3 g/dL (ref 30.0–36.0)
MCV: 91.5 fL (ref 80.0–100.0)
Platelets: 270 10*3/uL (ref 150–400)
RBC: 4.6 MIL/uL (ref 4.22–5.81)
RDW: 12.8 % (ref 11.5–15.5)
WBC: 16.2 10*3/uL — ABNORMAL HIGH (ref 4.0–10.5)
nRBC: 0 % (ref 0.0–0.2)

## 2019-07-26 MED ORDER — SENNOSIDES-DOCUSATE SODIUM 8.6-50 MG PO TABS
1.0000 | ORAL_TABLET | Freq: Two times a day (BID) | ORAL | Status: DC
  Administered 2019-07-26: 1 via ORAL
  Filled 2019-07-26 (×2): qty 1

## 2019-07-26 MED ORDER — POLYETHYLENE GLYCOL 3350 17 G PO PACK
17.0000 g | PACK | Freq: Every day | ORAL | Status: DC
  Administered 2019-07-26: 17 g via ORAL
  Filled 2019-07-26: qty 1

## 2019-07-26 NOTE — Progress Notes (Signed)
PROGRESS NOTE    Theodore Stevenson  PYK:[id number] DOB: [birth year] DOA: 07/24/2019 PCP: Patient, No Pcp Per   Brief Narrative:  Theodore Stevenson is a 64 y.o. male with history of hypertension, spinal stenosis hepatitis presents to the ER for the second time with complaints of abdominal discomfort and diarrhea.  Patient had come on July 10 about 3 days ago for diarrhea and right lower quadrant pain at that time CT abdomen pelvis showed severe colitis involving the ascending colon was discharged home on oral antibiotics.  Despite taking which patient's diarrhea further worsen with abdominal discomfort no definite fever or chills.  But patient also started noticing some blood in his stools.  Patient states prior to the recent visit in the ER he had taken antibiotics about 2 months ago for a surgical procedure. In the ER patient had a repeat CT abdomen pelvis which shows diffuse colitis with ascites and also a pancreatic cyst.  Lab work is significant for leukocytosis of 21,000 with blood glucose of 201 and sodium of 134.  Patient was started on IV fluids and empiric antibiotics stool for occult blood was positive.  Admitted for further management of colitis.  Assessment & Plan:   Principal Problem:   Colitis Active Problems:   Hypertension   Hepatitis   Acute sepsis in the setting of E. coli 0157 colitis with Hemoccult positive stool.  POA. - Patient meets sepsis criteria given leukocytosis tachycardia and notable infectious diarrhea - Stop Cipro/Flagyl, cultures remarkable for E. coli 0157, hold antibiotics in hopes to decrease likelihood of hemolytic uremic syndrome  Hypertension, essential - Resume home medications once able to tolerate p.o. safely  Pancreatic cyst - Incidentally noted on imaging, outpatient follow-up and serial exams per guidelines with PCP  Hyperglycemia, transient Lab Results  Component Value Date   HGBA1C 5.7 (H) 07/25/2019   DVT prophylaxis: SCDs for now.   Will avoid anticoagulation for now due to bloody diarrhea. Code Status: Full code.  Family Communication: At bedside Status is: Inpatient Dispo:  The patient is from: Home              Anticipated d/c is to: Home              Anticipated d/c date is: 48 to 72 hours              Patient currently not medically stable for discharge given ongoing infectious diarrhea with notable bloody component.  Consultants:   None  Procedures:   None planned  Antimicrobials:  Cipro, Flagyl  Subjective: No acute issues or events overnight, patient's only complaint this morning is nausea, abdominal pain moderately improving.  Denies headache, fever, chills, constipation, shortness of breath, chest pain.  Objective: Vitals:   07/25/19 0924 07/25/19 1346 07/25/19 2030 07/26/19 0500  BP: (!) 145/66 (!) 161/69 (!) 164/78 (!) 154/71  Pulse: 84 77 78 73  Resp: 16 16    Temp: 98.6 F (37 C) 98.8 F (37.1 C) 98 F (36.7 C) 98 F (36.7 C)  TempSrc: Oral Oral Oral Oral  SpO2: 100% 99% 99% 98%  Weight:      Height:        Intake/Output Summary (Last 24 hours) at 07/26/2019 0803 Last data filed at 07/25/2019 2203 Gross per 24 hour  Intake 1795.39 ml  Output --  Net 1795.39 ml   Filed Weights   07/24/19 2000  Weight: 81.4 kg    Examination:  General:  Pleasantly resting in bed, No  acute distress. HEENT:  Normocephalic atraumatic.  Sclerae nonicteric, noninjected.  Extraocular movements intact bilaterally. Neck:  Without mass or deformity.  Trachea is midline. Lungs:  Clear to auscultate bilaterally without rhonchi, wheeze, or rales. Heart:  Regular rate and rhythm.  Without murmurs, rubs, or gallops. Abdomen:  Soft, nontender, nondistended.  Without guarding or rebound. Extremities: Without cyanosis, clubbing, edema, or obvious deformity. Vascular:  Dorsalis pedis and posterior tibial pulses palpable bilaterally. Skin:  Warm and dry, no erythema, no ulcerations.  Data Reviewed: I have  personally reviewed following labs and imaging studies  CBC: Recent Labs  Lab 07/22/19 1237 07/24/19 1114 07/24/19 2256 07/25/19 0213 07/26/19 0637  WBC 16.6* 21.3* 20.9* 19.2* 16.2*  HGB 15.7 16.2 14.4 14.3 14.0  HCT 48.1 48.5 42.2 42.5 42.1  MCV 92.1 90.0 91.3 91.6 91.5  PLT 247 246 247 236 270   Basic Metabolic Panel: Recent Labs  Lab 07/22/19 1237 07/24/19 1114 07/25/19 0213 07/26/19 0637  NA 139 134* 134* 137  K 4.1 3.9 3.8 3.4*  CL 101 95* 100 104  CO2 25 24 26 24   GLUCOSE 146* 201* 156* 119*  BUN 20 29* 33* 32*  CREATININE 1.23 1.17 1.22 1.06  CALCIUM 8.8* 8.6* 7.8* 7.9*   GFR: Estimated Creatinine Clearance: 67.4 mL/min (by C-G formula based on SCr of 1.06 mg/dL). Liver Function Tests: Recent Labs  Lab 07/22/19 1237 07/24/19 1114 07/25/19 0213 07/26/19 0637  AST 22 21 19  36  ALT 18 14 14 25   ALKPHOS 84 74 52 55  BILITOT 0.5 0.5 0.7 0.4  PROT 7.3 6.7 5.5* 5.5*  ALBUMIN 4.0 3.5 3.0* 3.0*   Recent Labs  Lab 07/22/19 1237 07/24/19 1114  LIPASE 17 14   No results for input(s): AMMONIA in the last 168 hours. Coagulation Profile: No results for input(s): INR, PROTIME in the last 168 hours. Cardiac Enzymes: No results for input(s): CKTOTAL, CKMB, CKMBINDEX, TROPONINI in the last 168 hours. BNP (last 3 results) No results for input(s): PROBNP in the last 8760 hours. HbA1C: Recent Labs    07/25/19 0213  HGBA1C 5.7*   CBG: No results for input(s): GLUCAP in the last 168 hours. Lipid Profile: No results for input(s): CHOL, HDL, LDLCALC, TRIG, CHOLHDL, LDLDIRECT in the last 72 hours. Thyroid Function Tests: No results for input(s): TSH, T4TOTAL, FREET4, T3FREE, THYROIDAB in the last 72 hours. Anemia Panel: No results for input(s): VITAMINB12, FOLATE, FERRITIN, TIBC, IRON, RETICCTPCT in the last 72 hours. Sepsis Labs: Recent Labs  Lab 07/24/19 1255 07/24/19 1645  LATICACIDVEN 2.5* 1.5    Recent Results (from the past 240 hour(s))  SARS  Coronavirus 2 by RT PCR (hospital order, performed in [facility] hospital lab) Nasopharyngeal Nasopharyngeal Swab     Status: None   Collection Time: 07/24/19 12:59 PM   Specimen: Nasopharyngeal Swab  Result Value Ref Range Status   SARS Coronavirus 2 NEGATIVE NEGATIVE Final    Comment: (NOTE) SARS-CoV-2 target nucleic acids are NOT DETECTED.  The SARS-CoV-2 RNA is generally detectable in upper and lower respiratory specimens during the acute phase of infection. The lowest concentration of SARS-CoV-2 viral copies this assay can detect is 250 copies / mL. A negative result does not preclude SARS-CoV-2 infection and should not be used as the sole basis for treatment or other patient management decisions.  A negative result may occur with improper specimen collection / handling, submission of specimen other than nasopharyngeal swab, presence of viral mutation(s) within the areas targeted by this  assay, and inadequate number of viral copies (<250 copies / mL). A negative result must be combined with clinical observations, patient history, and epidemiological information.  Fact Sheet for Patients:   BoilerBrush.com.cy  Fact Sheet for Healthcare Providers: https://pope.com/  This test is not yet approved or  cleared by the [place] FDA and has been authorized for detection and/or diagnosis of SARS-CoV-2 by FDA under an Emergency Use Authorization (EUA).  This EUA will remain in effect (meaning this test can be used) for the duration of the COVID-19 declaration under Section 564(b)(1) of the Act, 21 U.S.C. section 360bbb-3(b)(1), unless the authorization is terminated or revoked sooner.  Performed at [facility], [address] [place], [place] [address]   Gastrointestinal Panel by PCR , Stool     Status: Abnormal   Collection Time: 07/24/19  7:10 PM   Specimen: Stool  Result Value Ref Range Status   Campylobacter  species NOT DETECTED NOT DETECTED Final   Plesimonas shigelloides NOT DETECTED NOT DETECTED Final   Salmonella species NOT DETECTED NOT DETECTED Final   Yersinia enterocolitica NOT DETECTED NOT DETECTED Final   Vibrio species NOT DETECTED NOT DETECTED Final   Vibrio cholerae NOT DETECTED NOT DETECTED Final   Enteroaggregative E coli (EAEC) NOT DETECTED NOT DETECTED Final   Enterotoxigenic E coli (ETEC) NOT DETECTED NOT DETECTED Final   Shiga like toxin producing E coli (STEC) DETECTED (A) NOT DETECTED Final    Comment: RESULT CALLED TO, READ BACK BY AND VERIFIED WITH: JESSICA MAYS AT 1347 ON 07/25/2019 [facility].    E. coli O157 DETECTED (A) NOT DETECTED Final    Comment: RESULT CALLED TO, READ BACK BY AND VERIFIED WITH: JESSICA MAYS AT 1347 ON 07/25/2019 [facility].    Shigella/Enteroinvasive E coli (EIEC) NOT DETECTED NOT DETECTED Final   Cryptosporidium NOT DETECTED NOT DETECTED Final   Cyclospora cayetanensis NOT DETECTED NOT DETECTED Final   Entamoeba histolytica NOT DETECTED NOT DETECTED Final   Giardia lamblia NOT DETECTED NOT DETECTED Final   Adenovirus F40/41 NOT DETECTED NOT DETECTED Final   Astrovirus NOT DETECTED NOT DETECTED Final   Norovirus GI/GII NOT DETECTED NOT DETECTED Final   Rotavirus A NOT DETECTED NOT DETECTED Final   Sapovirus (I, II, IV, and V) NOT DETECTED NOT DETECTED Final    Comment: Performed at [facility], [address], [place], [place] [address]    Radiology Studies: CT ABDOMEN PELVIS W CONTRAST  Result Date: 07/24/2019 CLINICAL DATA:  Right lower quadrant pain with vomiting. EXAM: CT ABDOMEN AND PELVIS WITH CONTRAST TECHNIQUE: Multidetector CT imaging of the abdomen and pelvis was performed using the standard protocol following bolus administration of intravenous contrast. CONTRAST:  OMNIPAQUE IOHEXOL 300 MG/ML  SOLN COMPARISON:  None FINDINGS: Lower chest: No acute abnormality. Hepatobiliary: No focal liver abnormality. Mild gallbladder  distension. No gallbladder wall thickening. No biliary ductal dilatation. Pancreas: Small cystic lesion within head of pancreas measures 0.8 cm, image 35/2. No main duct dilatation, solid enhancing mass or pancreatic inflammation. Spleen: Small cyst within spleen measures 5 mm. Spleen is otherwise unremarkable. Adrenals/Urinary Tract: Normal adrenal glands. The kidneys are unremarkable. No mass or hydronephrosis. There is beam hardening artifact from bilateral hip arthroplasty devices. This diminishes exam detail at the level of the urinary bladder. Stomach/Bowel: Stomach appears normal. The small bowel loops have a normal course and caliber without wall thickening, inflammation or distension. The appendix is visualized and appears normal. Progressive, diffuse colonic wall edema and inflammation  is identified compatible with pancolitis. No convincing evidence for pneumatosis or bowel perforation. Distal colonic diverticula identified. Vascular/Lymphatic: The portal vein and superior mesenteric vein appear patent. Aortic atherosclerosis without aneurysm. No abdominopelvic adenopathy. Reproductive: Prostate is unremarkable. Large left hydrocele and small right hydrocele identified. Other: Small to moderate volume of ascites identified within the abdomen and pelvis. No focal fluid collections identified. Musculoskeletal: Status post bilateral hip arthroplasty. Lumbar degenerative disc disease. IMPRESSION: 1. Progressive, diffuse colonic wall edema and inflammation compatible with pancolitis. No convincing evidence for pneumatosis or bowel perforation. 2. Small to moderate volume of ascites identified within the abdomen and pelvis. 3. Aortic atherosclerosis. 4. Large left hydrocele. 5. 8 mm cystic lesion noted within head of pancreas, nonspecific. Follow-up imaging in 24 months with pancreas protocol MRI or CT without and with contrast material is advised. This recommendation follows ACR consensus guidelines: Management  of Incidental Pancreatic Cysts: A White Paper of the ACR Incidental Findings Committee. J Am Coll Radiol 2017;14:911-923. Aortic Atherosclerosis (ICD10-I70.0). Electronically Signed   By: Signa Kellaylor  Stroud M.D.   On: 07/24/2019 13:54   Scheduled Meds: . gabapentin  600 mg Oral QHS   Continuous Infusions:    LOS: 2 days    Time spent: 40min   Azucena FallenWilliam C Claude Swendsen, DO [facility]  If 7PM-7AM, please contact night-coverage [email or web address]  07/26/2019, 8:03 AM

## 2019-07-26 NOTE — Plan of Care (Signed)

## 2019-07-27 DIAGNOSIS — K529 Noninfective gastroenteritis and colitis, unspecified: Secondary | ICD-10-CM | POA: Diagnosis not present

## 2019-07-27 LAB — COMPREHENSIVE METABOLIC PANEL
ALT: 27 U/L (ref 0–44)
AST: 35 U/L (ref 15–41)
Albumin: 2.7 g/dL — ABNORMAL LOW (ref 3.5–5.0)
Alkaline Phosphatase: 49 U/L (ref 38–126)
Anion gap: 7 (ref 5–15)
BUN: 24 mg/dL — ABNORMAL HIGH (ref 8–23)
CO2: 26 mmol/L (ref 22–32)
Calcium: 8.2 mg/dL — ABNORMAL LOW (ref 8.9–10.3)
Chloride: 107 mmol/L (ref 98–111)
Creatinine, Ser: 0.93 mg/dL (ref 0.61–1.24)
GFR calc Af Amer: >60 mL/min (ref >60)
GFR calc non Af Amer: >60 mL/min (ref >60)
Glucose, Bld: 95 mg/dL (ref 70–99)
Potassium: 3.9 mmol/L (ref 3.5–5.1)
Sodium: 140 mmol/L (ref 135–145)
Total Bilirubin: 0.4 mg/dL (ref 0.3–1.2)
Total Protein: 5 g/dL — ABNORMAL LOW (ref 6.5–8.1)

## 2019-07-27 LAB — CBC
HCT: 38.5 % — ABNORMAL LOW (ref 39.0–52.0)
Hemoglobin: 12.8 g/dL — ABNORMAL LOW (ref 13.0–17.0)
MCH: 30.8 pg (ref 26.0–34.0)
MCHC: 33.2 g/dL (ref 30.0–36.0)
MCV: 92.8 fL (ref 80.0–100.0)
Platelets: 241 10*3/uL (ref 150–400)
RBC: 4.15 MIL/uL — ABNORMAL LOW (ref 4.22–5.81)
RDW: 12.9 % (ref 11.5–15.5)
WBC: 12.6 10*3/uL — ABNORMAL HIGH (ref 4.0–10.5)
nRBC: 0 % (ref 0.0–0.2)

## 2019-07-27 MED ORDER — ONDANSETRON HCL 4 MG PO TABS: 4.0000 mg | ORAL_TABLET | ORAL | 0 refills | Status: DC | PRN

## 2019-07-27 NOTE — Progress Notes (Signed)
Per infection prevention patient does not require enteric precautions.

## 2019-07-27 NOTE — Care Management Important Message (Signed)
Important Message  Patient Details IM Letter given to Orvan Seen RN Case Manager to present to the Patient Name: DAESHAWN REDMANN MRN: [id number] Date of Birth: October 13, 1955   Medicare Important Message Given:  Yes     Caren Macadam 07/27/2019, 12:08 PM

## 2019-07-27 NOTE — Plan of Care (Signed)

## 2019-07-27 NOTE — Discharge Summary (Signed)
Physician Discharge Summary  Theodore Stevenson:[id number] DOB: 1955-06-29 DOA: 07/24/2019  PCP: Patient, No Pcp Per  Admit date: 07/24/2019 Discharge date: 07/27/2019  Admitted From: Home Disposition: Home  Recommendations for Outpatient Follow-up:  1. Follow up with PCP in 1-2 weeks 2. Please obtain BMP/CBC in one week  Home Health: None Equipment/Devices: None  Discharge Condition: Stable CODE STATUS: Full Diet recommendation: Bland low residue diet  Brief/Interim Summary: Theodore Stevenson a 64 y.o.malewithhistory of hypertension, spinal stenosis hepatitis presents to the ER for the second time with complaints of abdominal discomfort and diarrhea. Patient had come on July 10 about 3 days ago for diarrhea and right lower quadrant pain at that time CT abdomen pelvis showed severe colitis involving the ascending colon was discharged home on oral antibiotics. Despite taking which patient's diarrhea further worsen with abdominal discomfort no definite fever or chills. But patient also started noticing some blood in his stools. Patient states prior to the recent visit in the ER he had taken antibiotics about 2 months ago for a surgical procedure. In the ER patient had a repeat CT abdomen pelvis which shows diffuse colitis with ascites and also a pancreatic cyst. Lab work is significant for leukocytosis of 21,000 with blood glucose of 201 and sodium of 134. Patient was started on IV fluids and empiric antibiotics stool for occult blood was positive. Admitted for further management of colitis.  Patient admitted for acute intractable nausea vomiting diarrhea in the setting of colitis, cultures resulted with E. coli [id number] with notably Hemoccult positive stool.  Given patient's cultures antibiotics were stopped patient was treated supportively with IV fluids until he was able to tolerate p.o. safely.  Patient now tolerating p.o. quite well, able to keep up with any GI losses although  quite minimal at this point.  Given resolution of symptoms, able to tolerate p.o. safely patient requested discharge home which is certainly reasonable.  Patient will close follow-up with PCP in the next 3 to 5 days to ensure resolution of symptoms and to ensure volume status and appropriate p.o. intake.  Repeat labs as per PCP.  Incidentally noted pancreatic cyst on imaging, recommend repeat imaging follow-up in the outpatient setting, defer to PCP.  Discharge Diagnoses:  Principal Problem:   Colitis Active Problems:   Hypertension   Hepatitis    Discharge Instructions  Discharge Instructions    Call MD for:  difficulty breathing, headache or visual disturbances   Complete by: As directed    Call MD for:  extreme fatigue   Complete by: As directed    Call MD for:  hives   Complete by: As directed    Call MD for:  persistant dizziness or light-headedness   Complete by: As directed    Call MD for:  persistant nausea and vomiting   Complete by: As directed    Call MD for:  severe uncontrolled pain   Complete by: As directed    Call MD for:  temperature >100.4   Complete by: As directed    Diet - low sodium heart healthy   Complete by: As directed    Increase activity slowly   Complete by: As directed      Allergies as of 07/27/2019      Reactions   Codeine Nausea And Vomiting   Penicillins    Pt was young unsure exactly      Medication List    STOP taking these medications   ciprofloxacin 500 MG tablet Commonly known  as: CIPRO   diclofenac 50 MG EC tablet Commonly known as: VOLTAREN   methylPREDNISolone 4 MG tablet Commonly known as: Medrol   metroNIDAZOLE 500 MG tablet Commonly known as: FLAGYL     TAKE these medications   benzonatate 100 MG capsule Commonly known as: TESSALON Take 100 mg by mouth 2 (two) times daily.   ezetimibe-simvastatin 10-40 MG tablet Commonly known as: VYTORIN Take 1 tablet by mouth daily.   gabapentin 300 MG capsule Commonly  known as: NEURONTIN Take 600 mg by mouth at bedtime.   lisinopril-hydrochlorothiazide 20-25 MG tablet Commonly known as: ZESTORETIC Take 1.5 tablets by mouth daily.   ondansetron 4 MG tablet Commonly known as: ZOFRAN Take 1 tablet (4 mg total) by mouth every 4 (four) hours as needed for nausea.   oxyCODONE-acetaminophen 5-325 MG tablet Commonly known as: PERCOCET/ROXICET Take 1-2 tablets by mouth every 6 (six) hours as needed for severe pain.       Allergies  Allergen Reactions  . Codeine Nausea And Vomiting  . Penicillins     Pt was young unsure exactly      Consultations: None  Procedures/Studies: DG Abd 1 View  Result Date: 07/26/2019 CLINICAL DATA:  Abdominal pain.  Emesis. EXAM: ABDOMEN - 1 VIEW COMPARISON:  Abdominal CT 2 days ago also 07/22/2019 FINDINGS: No small bowel dilatation or obstruction. No evidence of free air on this portable supine view. Thickening of the colonic haustral compatible with colitis as seen on recent CTs. No radiopaque calculi. Bilateral hip arthroplasties. IMPRESSION: Colonic wall thickening compatible with colitis as seen on recent CT. No small bowel dilatation or obstruction. Electronically Signed   By: Narda Rutherford M.D.   On: 07/26/2019 17:17   CT ABDOMEN PELVIS W CONTRAST  Result Date: 07/24/2019 CLINICAL DATA:  Right lower quadrant pain with vomiting. EXAM: CT ABDOMEN AND PELVIS WITH CONTRAST TECHNIQUE: Multidetector CT imaging of the abdomen and pelvis was performed using the standard protocol following bolus administration of intravenous contrast. CONTRAST:  OMNIPAQUE IOHEXOL 300 MG/ML  SOLN COMPARISON:  None FINDINGS: Lower chest: No acute abnormality. Hepatobiliary: No focal liver abnormality. Mild gallbladder distension. No gallbladder wall thickening. No biliary ductal dilatation. Pancreas: Small cystic lesion within head of pancreas measures 0.8 cm, image 35/2. No main duct dilatation, solid enhancing mass or pancreatic  inflammation. Spleen: Small cyst within spleen measures 5 mm. Spleen is otherwise unremarkable. Adrenals/Urinary Tract: Normal adrenal glands. The kidneys are unremarkable. No mass or hydronephrosis. There is beam hardening artifact from bilateral hip arthroplasty devices. This diminishes exam detail at the level of the urinary bladder. Stomach/Bowel: Stomach appears normal. The small bowel loops have a normal course and caliber without wall thickening, inflammation or distension. The appendix is visualized and appears normal. Progressive, diffuse colonic wall edema and inflammation is identified compatible with pancolitis. No convincing evidence for pneumatosis or bowel perforation. Distal colonic diverticula identified. Vascular/Lymphatic: The portal vein and superior mesenteric vein appear patent. Aortic atherosclerosis without aneurysm. No abdominopelvic adenopathy. Reproductive: Prostate is unremarkable. Large left hydrocele and small right hydrocele identified. Other: Small to moderate volume of ascites identified within the abdomen and pelvis. No focal fluid collections identified. Musculoskeletal: Status post bilateral hip arthroplasty. Lumbar degenerative disc disease. IMPRESSION: 1. Progressive, diffuse colonic wall edema and inflammation compatible with pancolitis. No convincing evidence for pneumatosis or bowel perforation. 2. Small to moderate volume of ascites identified within the abdomen and pelvis. 3. Aortic atherosclerosis. 4. Large left hydrocele. 5. 8 mm cystic lesion noted within  head of pancreas, nonspecific. Follow-up imaging in 24 months with pancreas protocol MRI or CT without and with contrast material is advised. This recommendation follows ACR consensus guidelines: Management of Incidental Pancreatic Cysts: A White Paper of the ACR Incidental Findings Committee. J Am Coll Radiol 2017;14:911-923. Aortic Atherosclerosis (ICD10-I70.0). Electronically Signed   By: Signa Kell M.D.   On:  07/24/2019 13:54   CT ABDOMEN PELVIS W CONTRAST  Result Date: 07/22/2019 CLINICAL DATA:  Right lower quadrant abdominal pain and diarrhea for 2 days. EXAM: CT ABDOMEN AND PELVIS WITH CONTRAST TECHNIQUE: Multidetector CT imaging of the abdomen and pelvis was performed using the standard protocol following bolus administration of intravenous contrast. CONTRAST:  OMNIPAQUE IOHEXOL 300 MG/ML  SOLN COMPARISON:  None. FINDINGS: Lower chest: Insert lung bases Hepatobiliary: No focal hepatic lesions or intrahepatic biliary dilatation. The gallbladder is normal. No common bile duct dilatation. Pancreas: No mass, inflammation or ductal dilatation. Spleen: Normal size.  No focal lesions. Adrenals/Urinary Tract: The adrenal glands and kidneys are unremarkable. The bladder is grossly normal. Moderate artifact due to bilateral hip prostheses. Stomach/Bowel: The stomach, duodenum and small bowel are unremarkable. No acute inflammatory changes, mass lesions or obstructive findings. Terminal ileum is normal. Severe diffuse inflammatory or infectious process involving the ascending colon. Marked colonic wall thickening, pericolonic interstitial changes and pericolonic fluid. No involvement of the transverse colon or the remainder of the colon. Moderate descending colon and sigmoid colon diverticulosis but no findings for acute diverticulitis. The appendix is normal. Vascular/Lymphatic: Advanced atherosclerotic calcifications involving the aorta and iliac arteries and branch vessels. No aneurysm or dissection. The major venous structures are patent. Small scattered mesenteric and retroperitoneal lymph nodes but no mass or overt adenopathy. Reproductive: The prostate gland and seminal vesicles are grossly normal but limited evaluation due to artifact. There is a large left scrotal hydrocele noted. Other: No abdominal wall hernia or subcutaneous lesions. Musculoskeletal: Bilateral hip prostheses with significant artifact.  Advanced degenerative changes are noted in the lower lumbar spine. IMPRESSION: 1. Severe diffuse inflammatory or infectious colitis involving the ascending colon. 2. No other significant abdominal/pelvic findings, mass lesions or adenopathy. 3. Advanced atherosclerotic calcifications involving the aorta and branch vessels. 4. Large left scrotal hydrocele. Aortic Atherosclerosis (ICD10-I70.0). Electronically Signed   By: Rudie Meyer M.D.   On: 07/22/2019 14:34     Subjective: No acute issues or events overnight, tolerating p.o. safely, denies nausea, vomiting, diarrhea, constipation, headache, fevers, chills.   Discharge Exam: Vitals:   07/26/19 2115 07/27/19 0431  BP: (!) 156/67 (!) 155/70  Pulse: 71 68  Resp: 18 16  Temp: 98.7 F (37.1 C) 97.9 F (36.6 C)  SpO2: 99% 99%   Vitals:   07/26/19 0500 07/26/19 2115 07/27/19 0430 07/27/19 0431  BP: (!) 154/71 (!) 156/67  (!) 155/70  Pulse: 73 71  68  Resp:  18  16  Temp: 98 F (36.7 C) 98.7 F (37.1 C)  97.9 F (36.6 C)  TempSrc: Oral Oral  Oral  SpO2: 98% 99%  99%  Weight:   84.4 kg   Height:        General: Pt is alert, awake, not in acute distress Cardiovascular: RRR, S1/S2 +, no rubs, no gallops Respiratory: CTA bilaterally, no wheezing, no rhonchi Abdominal: Soft, NT, ND, bowel sounds + Extremities: no edema, no cyanosis    The results of significant diagnostics from this hospitalization (including imaging, microbiology, ancillary and laboratory) are listed below for reference.     Microbiology:  Recent Results (from the past 240 hour(s))  SARS Coronavirus 2 by RT PCR (hospital order, performed in [facility] hospital lab) Nasopharyngeal Nasopharyngeal Swab     Status: None   Collection Time: 07/24/19 12:59 PM   Specimen: Nasopharyngeal Swab  Result Value Ref Range Status   SARS Coronavirus 2 NEGATIVE NEGATIVE Final    Comment: (NOTE) SARS-CoV-2 target nucleic acids are NOT DETECTED.  The SARS-CoV-2 RNA is  generally detectable in upper and lower respiratory specimens during the acute phase of infection. The lowest concentration of SARS-CoV-2 viral copies this assay can detect is 250 copies / mL. A negative result does not preclude SARS-CoV-2 infection and should not be used as the sole basis for treatment or other patient management decisions.  A negative result may occur with improper specimen collection / handling, submission of specimen other than nasopharyngeal swab, presence of viral mutation(s) within the areas targeted by this assay, and inadequate number of viral copies (<250 copies / mL). A negative result must be combined with clinical observations, patient history, and epidemiological information.  Fact Sheet for Patients:   BoilerBrush.com.cy  Fact Sheet for Healthcare Providers: https://pope.com/  This test is not yet approved or  cleared by the [place] FDA and has been authorized for detection and/or diagnosis of SARS-CoV-2 by FDA under an Emergency Use Authorization (EUA).  This EUA will remain in effect (meaning this test can be used) for the duration of the COVID-19 declaration under Section 564(b)(1) of the Act, 21 U.S.C. section 360bbb-3(b)(1), unless the authorization is terminated or revoked sooner.  Performed at [facility], [address] [place], [place] [address]   Gastrointestinal Panel by PCR , Stool     Status: Abnormal   Collection Time: 07/24/19  7:10 PM   Specimen: Stool  Result Value Ref Range Status   Campylobacter species NOT DETECTED NOT DETECTED Final   Plesimonas shigelloides NOT DETECTED NOT DETECTED Final   Salmonella species NOT DETECTED NOT DETECTED Final   Yersinia enterocolitica NOT DETECTED NOT DETECTED Final   Vibrio species NOT DETECTED NOT DETECTED Final   Vibrio cholerae NOT DETECTED NOT DETECTED Final   Enteroaggregative E coli (EAEC) NOT DETECTED NOT DETECTED Final    Enterotoxigenic E coli (ETEC) NOT DETECTED NOT DETECTED Final   Shiga like toxin producing E coli (STEC) DETECTED (A) NOT DETECTED Final    Comment: RESULT CALLED TO, READ BACK BY AND VERIFIED WITH: JESSICA MAYS AT 1347 ON 07/25/2019 [facility].    E. coli O157 DETECTED (A) NOT DETECTED Final    Comment: RESULT CALLED TO, READ BACK BY AND VERIFIED WITH: JESSICA MAYS AT 1347 ON 07/25/2019 [facility].    Shigella/Enteroinvasive E coli (EIEC) NOT DETECTED NOT DETECTED Final   Cryptosporidium NOT DETECTED NOT DETECTED Final   Cyclospora cayetanensis NOT DETECTED NOT DETECTED Final   Entamoeba histolytica NOT DETECTED NOT DETECTED Final   Giardia lamblia NOT DETECTED NOT DETECTED Final   Adenovirus F40/41 NOT DETECTED NOT DETECTED Final   Astrovirus NOT DETECTED NOT DETECTED Final   Norovirus GI/GII NOT DETECTED NOT DETECTED Final   Rotavirus A NOT DETECTED NOT DETECTED Final   Sapovirus (I, II, IV, and V) NOT DETECTED NOT DETECTED Final    Comment: Performed at [facility], [address] [place], [place] [address]     Labs: BNP (last 3 results) No results for input(s): BNP in the last 8760 hours. Basic Metabolic Panel: Recent Labs  Lab 07/22/19 1237 07/24/19 1114 07/25/19  [id number] 07/26/19 0637 07/27/19 0528  NA 139 134* 134* 137 140  K 4.1 3.9 3.8 3.4* 3.9  CL 101 95* 100 104 107  CO2 25 24 26 24 26   GLUCOSE 146* 201* 156* 119* 95  BUN 20 29* 33* 32* 24*  CREATININE 1.23 1.17 1.22 1.06 0.93  CALCIUM 8.8* 8.6* 7.8* 7.9* 8.2*   Liver Function Tests: Recent Labs  Lab 07/22/19 1237 07/24/19 1114 07/25/19 0213 07/26/19 0637 07/27/19 0528  AST 22 21 19  36 35  ALT 18 14 14 25 27   ALKPHOS 84 74 52 55 49  BILITOT 0.5 0.5 0.7 0.4 0.4  PROT 7.3 6.7 5.5* 5.5* 5.0*  ALBUMIN 4.0 3.5 3.0* 3.0* 2.7*   Recent Labs  Lab 07/22/19 1237 07/24/19 1114  LIPASE 17 14   No results for input(s): AMMONIA in the last 168 hours. CBC: Recent Labs  Lab 07/24/19 1114 07/24/19 2256  07/25/19 0213 07/26/19 0637 07/27/19 0528  WBC 21.3* 20.9* 19.2* 16.2* 12.6*  HGB 16.2 14.4 14.3 14.0 12.8*  HCT 48.5 42.2 42.5 42.1 38.5*  MCV 90.0 91.3 91.6 91.5 92.8  PLT 246 247 236 270 241   Cardiac Enzymes: No results for input(s): CKTOTAL, CKMB, CKMBINDEX, TROPONINI in the last 168 hours. BNP: Invalid input(s): POCBNP CBG: No results for input(s): GLUCAP in the last 168 hours. D-Dimer No results for input(s): DDIMER in the last 72 hours. Hgb A1c Recent Labs    07/25/19 0213  HGBA1C 5.7*   Lipid Profile No results for input(s): CHOL, HDL, LDLCALC, TRIG, CHOLHDL, LDLDIRECT in the last 72 hours. Thyroid function studies No results for input(s): TSH, T4TOTAL, T3FREE, THYROIDAB in the last 72 hours.  Invalid input(s): FREET3 Anemia work up No results for input(s): VITAMINB12, FOLATE, FERRITIN, TIBC, IRON, RETICCTPCT in the last 72 hours. Urinalysis    Component Value Date/Time   COLORURINE YELLOW 07/24/2019 1350   APPEARANCEUR HAZY (A) 07/24/2019 1350   LABSPEC >1.030 (H) 07/24/2019 1350   PHURINE 5.5 07/24/2019 1350   GLUCOSEU 100 (A) 07/24/2019 1350   HGBUR TRACE (A) 07/24/2019 1350   BILIRUBINUR MODERATE (A) 07/24/2019 1350   KETONESUR 15 (A) 07/24/2019 1350   PROTEINUR 100 (A) 07/24/2019 1350   UROBILINOGEN 0.2 06/24/2011 1047   NITRITE NEGATIVE 07/24/2019 1350   LEUKOCYTESUR TRACE (A) 07/24/2019 1350   Sepsis Labs Invalid input(s): PROCALCITONIN,  WBC,  LACTICIDVEN Microbiology Recent Results (from the past 240 hour(s))  SARS Coronavirus 2 by RT PCR (hospital order[facility]) Nasopharyngeal Nasopharyngeal Swab     Status: None   Collection Time: 07/24/19 12:59 PM   Specimen: Nasopharyngeal Swab  Result Value Ref Range Status   SARS Coronavirus 2 NEGATIVE NEGATIVE Final    Comment: (NOTE) SARS-CoV-2 target nucleic acids are NOT DETECTED.  The SARS-CoV-2 RNA is generally detectable in upper and lower respiratory specimens  during the acute phase of infection. The lowest concentration of SARS-CoV-2 viral copies this assay can detect is 250 copies / mL. A negative result does not preclude SARS-CoV-2 infection and should not be used as the sole basis for treatment or other patient management decisions.  A negative result may occur with improper specimen collection / handling, submission of specimen other than nasopharyngeal swab, presence of viral mutation(s) within the areas targeted by this assay, and inadequate number of viral copies (<250 copies / mL). A negative result must be combined with clinical observations, patient history, and epidemiological information.  Fact Sheet for Patients:  BoilerBrush.com.cy  Fact Sheet for Healthcare Providers: https://pope.com/  This test is not yet approved or  cleared by the [place] FDA and has been authorized for detection and/or diagnosis of SARS-CoV-2 by FDA under an Emergency Use Authorization (EUA).  This EUA will remain in effect (meaning this test can be used) for the duration of the COVID-19 declaration under Section 564(b)(1) of the Act, 21 U.S.C. section 360bbb-3(b)(1), unless the authorization is terminated or revoked sooner.  Performed at [facility], [address] [place], [place] [address]   Gastrointestinal Panel by PCR , Stool     Status: Abnormal   Collection Time: 07/24/19  7:10 PM   Specimen: Stool  Result Value Ref Range Status   Campylobacter species NOT DETECTED NOT DETECTED Final   Plesimonas shigelloides NOT DETECTED NOT DETECTED Final   Salmonella species NOT DETECTED NOT DETECTED Final   Yersinia enterocolitica NOT DETECTED NOT DETECTED Final   Vibrio species NOT DETECTED NOT DETECTED Final   Vibrio cholerae NOT DETECTED NOT DETECTED Final   Enteroaggregative E coli (EAEC) NOT DETECTED NOT DETECTED Final   Enterotoxigenic E coli (ETEC) NOT DETECTED NOT DETECTED  Final   Shiga like toxin producing E coli (STEC) DETECTED (A) NOT DETECTED Final    Comment: RESULT CALLED TO, READ BACK BY AND VERIFIED WITH: JESSICA MAYS AT 1347 ON 07/25/2019 [facility].    E. coli O157 DETECTED (A) NOT DETECTED Final    Comment: RESULT CALLED TO, READ BACK BY AND VERIFIED WITH: JESSICA MAYS AT 1347 ON 07/25/2019 [facility].    Shigella/Enteroinvasive E coli (EIEC) NOT DETECTED NOT DETECTED Final   Cryptosporidium NOT DETECTED NOT DETECTED Final   Cyclospora cayetanensis NOT DETECTED NOT DETECTED Final   Entamoeba histolytica NOT DETECTED NOT DETECTED Final   Giardia lamblia NOT DETECTED NOT DETECTED Final   Adenovirus F40/41 NOT DETECTED NOT DETECTED Final   Astrovirus NOT DETECTED NOT DETECTED Final   Norovirus GI/GII NOT DETECTED NOT DETECTED Final   Rotavirus A NOT DETECTED NOT DETECTED Final   Sapovirus (I, II, IV, and V) NOT DETECTED NOT DETECTED Final    Comment: Performed at [facility], [address], [place], [place] [address]     Time coordinating discharge: Over 30 minutes  SIGNED:   Azucena Fallen, DO [facility] 07/27/2019, 11:24 AM Pager   If 7PM-7AM, please contact night-coverage [email or web address]

## 2019-08-06 ENCOUNTER — Emergency Department (HOSPITAL_BASED_OUTPATIENT_CLINIC_OR_DEPARTMENT_OTHER)
Admission: EM | Admit: 2019-08-06 | Discharge: 2019-08-06 | Disposition: A | Discharge status: Home / Self Care | Payer: Medicare Other | Attending: Emergency Medicine | Admitting: Emergency Medicine

## 2019-08-06 ENCOUNTER — Other Ambulatory Visit: Payer: Self-pay

## 2019-08-06 ENCOUNTER — Encounter (HOSPITAL_BASED_OUTPATIENT_CLINIC_OR_DEPARTMENT_OTHER): Payer: Self-pay | Admitting: Emergency Medicine

## 2019-08-06 DIAGNOSIS — R2241 Localized swelling, mass and lump, right lower limb: Secondary | ICD-10-CM | POA: Diagnosis not present

## 2019-08-06 DIAGNOSIS — R2231 Localized swelling, mass and lump, right upper limb: Secondary | ICD-10-CM | POA: Insufficient documentation

## 2019-08-06 DIAGNOSIS — Z5321 Procedure and treatment not carried out due to patient leaving prior to being seen by health care provider: Secondary | ICD-10-CM | POA: Insufficient documentation

## 2019-08-06 DIAGNOSIS — R2242 Localized swelling, mass and lump, left lower limb: Secondary | ICD-10-CM | POA: Insufficient documentation

## 2019-08-06 DIAGNOSIS — R2232 Localized swelling, mass and lump, left upper limb: Secondary | ICD-10-CM | POA: Diagnosis not present

## 2019-08-06 NOTE — ED Triage Notes (Addendum)
Patient states that he has an appointment with his regular dr tomorrow but he feels like they may not understand what the problem is. The patient states that after he was released from the hospital he has had swelling from his bilateral lower extremities up to his knees and to his bilateral hands. The patient states that the swelling has not got better at all. Pt denies any chest pain or SOB

## 2021-02-04 ENCOUNTER — Telehealth: Payer: Self-pay | Admitting: Orthopaedic Surgery

## 2021-02-04 NOTE — Telephone Encounter (Signed)
Theodore Stevenson called requesting to see Dr. [name] for bilateral hip pain. He has both hip replacements with [facility]. Theodore Stevenson states if he can not be seen sooner then Dr. [name] 2/21 can he see another ortho in our practice. Please call Theodore Stevenson at [phone].

## 2021-02-05 ENCOUNTER — Telehealth: Payer: Self-pay | Admitting: Orthopaedic Surgery

## 2021-02-05 NOTE — Telephone Encounter (Signed)
I called pt and he is asking to see Dr. Magnus Ivan instead. He is asking for permission. Please call pt he would like to speak to Lauren G about this matter. Pt phone number is [phone].

## 2021-02-06 NOTE — Telephone Encounter (Signed)
Scheduled next week

## 2021-02-13 ENCOUNTER — Encounter: Payer: Self-pay | Admitting: Orthopaedic Surgery

## 2021-02-13 ENCOUNTER — Other Ambulatory Visit: Payer: Self-pay

## 2021-02-13 ENCOUNTER — Ambulatory Visit (INDEPENDENT_AMBULATORY_CARE_PROVIDER_SITE_OTHER): Payer: Medicare Other | Admitting: Orthopaedic Surgery

## 2021-02-13 ENCOUNTER — Ambulatory Visit: Payer: Self-pay

## 2021-02-13 DIAGNOSIS — Z96641 Presence of right artificial hip joint: Secondary | ICD-10-CM | POA: Diagnosis not present

## 2021-02-13 DIAGNOSIS — Z96642 Presence of left artificial hip joint: Secondary | ICD-10-CM | POA: Diagnosis not present

## 2021-02-13 DIAGNOSIS — M7061 Trochanteric bursitis, right hip: Secondary | ICD-10-CM

## 2021-02-13 MED ORDER — METHYLPREDNISOLONE ACETATE 40 MG/ML IJ SUSP
40.0000 mg | INTRAMUSCULAR | Status: AC | PRN
  Administered 2021-02-13: 40 mg via INTRA_ARTICULAR

## 2021-02-13 MED ORDER — LIDOCAINE HCL 1 % IJ SOLN
3.0000 mL | INTRAMUSCULAR | Status: AC | PRN
  Administered 2021-02-13: 3 mL

## 2021-02-13 NOTE — Progress Notes (Signed)
Office Visit Note   Patient: Theodore Stevenson           Date of Birth: 08/10/1955           MRN: [id number] Visit Date: 02/13/2021              Requested by: No referring provider defined for this encounter. PCP: Patient, No Pcp Per (Inactive)   Assessment & Plan: Visit Diagnoses:  1. History of left hip replacement   2. History of total right hip replacement   3. Trochanteric bursitis, right hip     Plan: I think this is more of a muscle strain to this area from his hiking.  I recommended a steroid injection over the point of maximum tenderness and he agreed to this and tolerated it well.  I would like him to take 2 Aleve twice a day for the next week on a full stomach.  All questions and concerns were answered and addressed.  Follow-up can be as needed.  I did give him reassurance that thus far his hip replacements look great.  Follow-Up Instructions: Return if symptoms worsen or fail to improve.   Orders:  Orders Placed This Encounter  Procedures   Large Joint Inj   XR HIPS BILAT W OR W/O PELVIS 2V   No orders of the defined types were placed in this encounter.     Procedures: Large Joint Inj: R greater trochanter on 02/13/2021 2:18 PM Indications: pain and diagnostic evaluation Details: 22 G 1.5 in needle, lateral approach  Arthrogram: No  Medications: 3 mL lidocaine 1 %; 40 mg methylPREDNISolone acetate 40 MG/ML Outcome: tolerated well, no immediate complications Procedure, treatment alternatives, risks and benefits explained, specific risks discussed. Consent was given by the patient. Immediately prior to procedure a time out was called to verify the correct patient, procedure, equipment, support staff and site/side marked as required. Patient was prepped and draped in the usual sterile fashion.      Clinical Data: No additional findings.   Subjective: Chief Complaint  Patient presents with   Right Hip - Follow-up   Left Hip - Follow-up  The patient is a  very pleasant 66 year old gentleman who has a history of bilateral hip replacements.  The right hip was replaced in 2012 and the left hip was replaced in 2013.  These were done by Dr. Durward Fortes at the posterior approach.  He said they have done really well but then recently he started develop some right hip pain after going to the mountains and do a lot of hiking.  He points to the superior aspect of the trochanteric area and the lateral aspect the hip as source of his pain.  Denies any groin pain and his hip to be doing well for a while.  He says it is easing off some.  He denies any instability symptoms.  He is never had a dislocation.  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  He does want to get tied into a new orthopedic surgeon.  He is a big fan of Dr. Durward Fortes regardless.  He is not a diabetic.  He has not been taking any anti-inflammatories either.  HPI  Review of Systems   Objective: Vital Signs: There were no vitals taken for this visit.  Physical Exam He is alert and orient x3 and in no acute distress.  He is not walking with a limp or Trendelenburg gait.  He is not using an  assistive device to help with his mobility. Ortho Exam Examination of both hips show the move smoothly and fluidly.  His left hip is asymptomatic.  His right hip has some pain just proximal to the greater trochanter. Specialty Comments:  No specialty comments available.  Imaging: XR HIPS BILAT W OR W/O PELVIS 2V  Result Date: 02/13/2021 An AP pelvis and bilateral both hips shows well-seated total hip arthroplasties with no complicating features.  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 No pertinent family history.  Past Surgical History:  Procedure Laterality Date   BACK SURGERY     1987   CARPAL TUNNEL RELEASE     right and left   HIP SURGERY  06/30/2011   total left hip   JOINT REPLACEMENT     right total hip   TOTAL HIP ARTHROPLASTY  06/30/2011   Procedure: TOTAL HIP ARTHROPLASTY;  Surgeon: Garald Balding, MD;  Location: [facility];  Service: Orthopedics;  Laterality: Left;   Social History   Occupational History   Not on file  Tobacco Use   Smoking status: Former    Packs/day: 1.00    Years: 10.00    Pack years: 10.00    Types: Cigarettes    Quit date: 06/22/2006    Years since quitting: 14.6   Smokeless tobacco: Current    Types: Chew  Vaping Use   Vaping Use: Never used  Substance and Sexual Activity   Alcohol use: No   Drug use: No   Sexual activity: Yes

## 2021-03-14 IMAGING — DX DG ABDOMEN 1V
1 series · 1 of 1 positions shown · non-contrast
Comparison: Abdominal CT 2 days ago also 07/22/2019

CLINICAL DATA: Abdominal pain.  Emesis.

EXAM:
ABDOMEN - 1 VIEW

[abdomen kub]
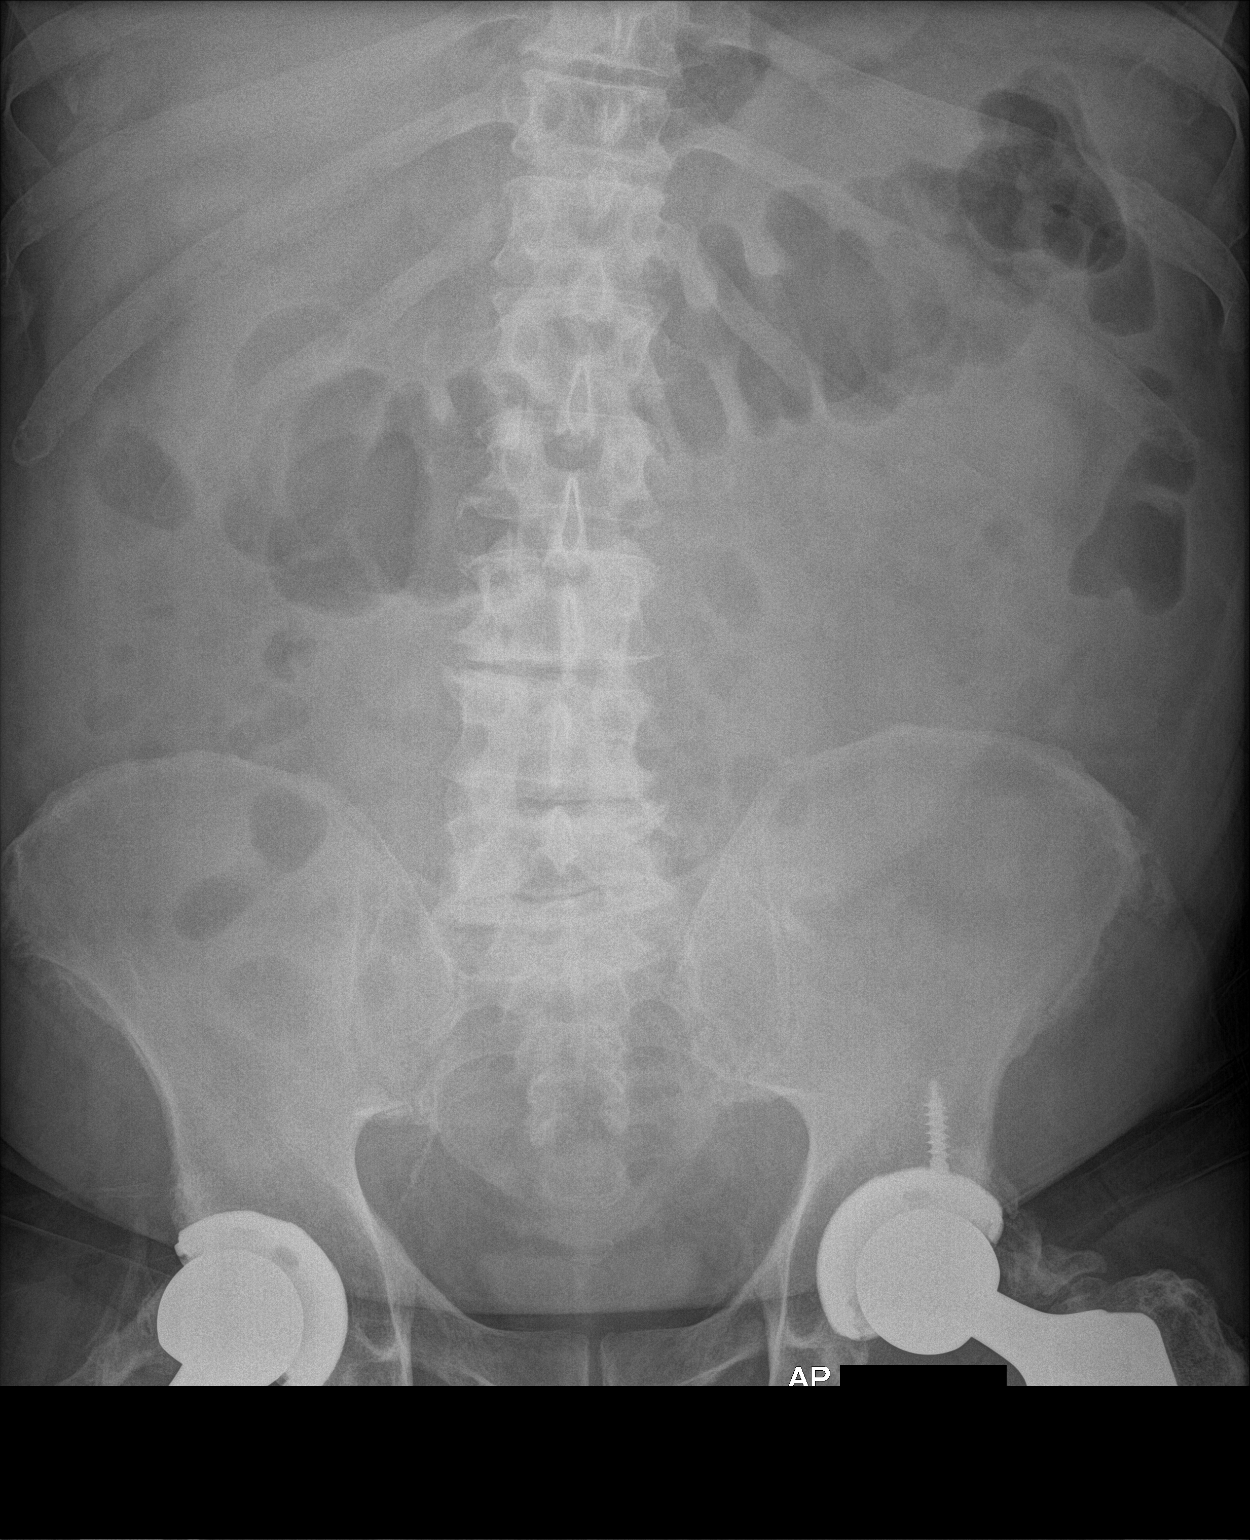

[1 of 1 positions shown; findings below may reference images not displayed]

FINDINGS: No small bowel dilatation or obstruction. No evidence of free air on
this portable supine view. Thickening of the colonic haustral
compatible with colitis as seen on recent CTs. No radiopaque
calculi. Bilateral hip arthroplasties.
IMPRESSION: Colonic wall thickening compatible with colitis as seen on recent
CT. No small bowel dilatation or obstruction.

## 2023-10-18 ENCOUNTER — Ambulatory Visit (INDEPENDENT_AMBULATORY_CARE_PROVIDER_SITE_OTHER): Payer: Self-pay | Admitting: Physician Assistant

## 2023-10-18 ENCOUNTER — Encounter: Payer: Self-pay | Admitting: Physician Assistant

## 2023-10-18 ENCOUNTER — Other Ambulatory Visit: Payer: Self-pay

## 2023-10-18 ENCOUNTER — Other Ambulatory Visit: Payer: Self-pay | Admitting: Radiology

## 2023-10-18 DIAGNOSIS — M5442 Lumbago with sciatica, left side: Secondary | ICD-10-CM | POA: Diagnosis not present

## 2023-10-18 MED ORDER — TRAMADOL HCL 50 MG PO TABS: 50.0000 mg | ORAL_TABLET | Freq: Four times a day (QID) | ORAL | 0 refills | Status: DC | PRN

## 2023-10-18 MED ORDER — METHYLPREDNISOLONE 4 MG PO TABS: ORAL_TABLET | ORAL | 0 refills | Status: DC

## 2023-10-18 NOTE — Progress Notes (Signed)
 Office Visit Note   Patient: Theodore Stevenson           Date of Birth: 1955/12/28           MRN: [id number] Visit Date: 10/18/2023              Requested by: No referring provider defined for this encounter. PCP: Patient, No Pcp Per   Assessment & Plan: Visit Diagnoses:  1. Acute left-sided low back pain with left-sided sciatica     Plan: Will place him on a Medrol  Dosepak.  Give him some tramadol to help with the pain.  He is to use this sparingly.  Follow-up with us  in 6 weeks if he fails conservative treatment which includes the medications and going back to doing his home exercise program for his back then would recommend MRI for possible epidural steroid treatment.  Questions were encouraged and answered at length  Follow-Up Instructions: Return in about 6 weeks (around 11/29/2023).   Orders:  Orders Placed This Encounter  Procedures   XR Lumbar Spine 2-3 Views   Meds ordered this encounter  Medications   methylPREDNISolone  (MEDROL ) 4 MG tablet    Sig: Take as directed    Dispense:  21 tablet    Refill:  0   traMADol (ULTRAM) 50 MG tablet    Sig: Take 1 tablet (50 mg total) by mouth every 6 (six) hours as needed.    Dispense:  30 tablet    Refill:  0      Procedures: No procedures performed   Clinical Data: No additional findings.   Subjective: Chief Complaint  Patient presents with   Left Hip - Pain    HPI Patient 68 year old male well-known to Dr. Vernetta service comes in today for left lower back pain and left proximal calf pain.  Has a history of 2 back surgeries last one done by Dr. Malcolm.  He reports he has been doing well until the last few weeks he has had increased pain in his left buttocks.  His pain also in the posterior lateral calf on the left.  No injury.  States he has been riding mower bounced around.  He has difficulty mainly in the morning when he first gets up and is worse whenever he brushes his teeth.  States pain is worse with  walking.  Negative for fevers chills.  Nondiabetic. He has been to therapy in the past and had epidural steroid injections in his lumbar spine.  Last MRI of his lumbar spine 2019 showed L3-4 severe spinal stenosis and lateral recess stenosis.  L4-5 severe spinal and bilateral recess stenosis.  Has been to therapy in the past but has not been doing the exercises he states he still has the home exercise sheets.   Review of Systems  Constitutional:  Negative for chills and fever.  Musculoskeletal:  Positive for back pain.  Neurological:  Negative for numbness.     Objective: Vital Signs: There were no vitals taken for this visit.  Physical Exam Constitutional:      Appearance: He is not ill-appearing.  Pulmonary:     Effort: Pulmonary effort is normal.  Neurological:     Mental Status: He is alert and oriented to person, place, and time.  Psychiatric:        Mood and Affect: Mood normal.     Ortho Exam Bilateral hips: Good range of motion without pain.  Lower extremities: 5 out of 5 strength throughout lower extremities against resistance  except for the great toes against resistance with dorsiflexion which is 4 out of 5 bilaterally.    Tight hamstrings bilaterally. Lumbar spine:Negative straight leg raise bilaterally.  Comes within a few inches of being in touch his toes.  Limited extension lumbar spine without pain.  Tenderness lower lumbar paraspinous region on the left.  Specialty Comments:  No specialty comments available.  Imaging: XR Lumbar Spine 2-3 Views Result Date: 10/18/2023 Lumbar spine 2 views: No acute fracture.  Loss of lordotic curvature.  Loss of disc space at L3-4 and L4-5.  Arthrosclerosis aorta.  No spondylolisthesis. Negative for acute fractures acute findings.    PMFS History: Patient Active Problem List   Diagnosis Date Noted   History of left hip replacement 02/13/2021   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   Colitis 07/24/2019   Chronic  left-sided low back pain with left-sided sciatica 02/14/2018   Avascular necrosis of hip ([facility]) 07/02/2011   Hypertension 07/02/2011   Hepatitis 07/02/2011   Postoperative anemia due to acute blood loss 07/02/2011   Past Medical History:  Diagnosis Date   Arthritis    Hepatitis    1973   Hypertension    Spinal stenosis     No family history on file.  Past Surgical History:  Procedure Laterality Date   BACK SURGERY     1987   CARPAL TUNNEL RELEASE     right and left   HIP SURGERY  06/30/2011   total left hip   JOINT REPLACEMENT     right total hip   TOTAL HIP ARTHROPLASTY  06/30/2011   Procedure: TOTAL HIP ARTHROPLASTY;  Surgeon: Maude LELON Right, MD;  Location: [facility];  Service: Orthopedics;  Laterality: Left;   Social History   Occupational History   Not on file  Tobacco Use   Smoking status: Former    Current packs/day: 0.00    Average packs/day: 1 pack/day for 10.0 years (10.0 ttl pk-yrs)    Types: Cigarettes    Start date: 06/21/1996    Quit date: 06/22/2006    Years since quitting: 17.3   Smokeless tobacco: Current    Types: Chew  Vaping Use   Vaping status: Never Used  Substance and Sexual Activity   Alcohol use: No   Drug use: No   Sexual activity: Yes

## 2023-11-15 ENCOUNTER — Encounter: Payer: Self-pay | Admitting: Radiology

## 2023-11-18 ENCOUNTER — Other Ambulatory Visit: Payer: Self-pay | Admitting: Neurosurgery

## 2023-11-19 ENCOUNTER — Encounter (HOSPITAL_BASED_OUTPATIENT_CLINIC_OR_DEPARTMENT_OTHER): Payer: Self-pay

## 2023-11-19 ENCOUNTER — Emergency Department (HOSPITAL_BASED_OUTPATIENT_CLINIC_OR_DEPARTMENT_OTHER)

## 2023-11-19 ENCOUNTER — Other Ambulatory Visit: Payer: Self-pay

## 2023-11-19 ENCOUNTER — Inpatient Hospital Stay (HOSPITAL_BASED_OUTPATIENT_CLINIC_OR_DEPARTMENT_OTHER)
Admission: EM | Admit: 2023-11-19 | Discharge: 2023-11-29 | DRG: 326 | Disposition: A | Discharge status: Home / Self Care | Attending: Internal Medicine | Admitting: Internal Medicine

## 2023-11-19 DIAGNOSIS — E872 Acidosis, unspecified: Secondary | ICD-10-CM | POA: Diagnosis not present

## 2023-11-19 DIAGNOSIS — Z6830 Body mass index (BMI) 30.0-30.9, adult: Secondary | ICD-10-CM

## 2023-11-19 DIAGNOSIS — E874 Mixed disorder of acid-base balance: Secondary | ICD-10-CM | POA: Diagnosis present

## 2023-11-19 DIAGNOSIS — E875 Hyperkalemia: Secondary | ICD-10-CM | POA: Diagnosis not present

## 2023-11-19 DIAGNOSIS — E8770 Fluid overload, unspecified: Secondary | ICD-10-CM | POA: Diagnosis not present

## 2023-11-19 DIAGNOSIS — Z885 Allergy status to narcotic agent status: Secondary | ICD-10-CM | POA: Diagnosis not present

## 2023-11-19 DIAGNOSIS — R262 Difficulty in walking, not elsewhere classified: Secondary | ICD-10-CM | POA: Diagnosis present

## 2023-11-19 DIAGNOSIS — G8929 Other chronic pain: Secondary | ICD-10-CM | POA: Diagnosis present

## 2023-11-19 DIAGNOSIS — E66812 Obesity, class 2: Secondary | ICD-10-CM | POA: Diagnosis present

## 2023-11-19 DIAGNOSIS — K251 Acute gastric ulcer with perforation: Secondary | ICD-10-CM | POA: Diagnosis not present

## 2023-11-19 DIAGNOSIS — N179 Acute kidney failure, unspecified: Secondary | ICD-10-CM | POA: Diagnosis present

## 2023-11-19 DIAGNOSIS — Z87891 Personal history of nicotine dependence: Secondary | ICD-10-CM | POA: Diagnosis not present

## 2023-11-19 DIAGNOSIS — N3000 Acute cystitis without hematuria: Secondary | ICD-10-CM

## 2023-11-19 DIAGNOSIS — K279 Peptic ulcer, site unspecified, unspecified as acute or chronic, without hemorrhage or perforation: Secondary | ICD-10-CM | POA: Diagnosis not present

## 2023-11-19 DIAGNOSIS — E876 Hypokalemia: Secondary | ICD-10-CM | POA: Diagnosis not present

## 2023-11-19 DIAGNOSIS — N390 Urinary tract infection, site not specified: Secondary | ICD-10-CM | POA: Diagnosis present

## 2023-11-19 DIAGNOSIS — K255 Chronic or unspecified gastric ulcer with perforation: Secondary | ICD-10-CM | POA: Diagnosis present

## 2023-11-19 DIAGNOSIS — Z720 Tobacco use: Secondary | ICD-10-CM

## 2023-11-19 DIAGNOSIS — I10 Essential (primary) hypertension: Secondary | ICD-10-CM | POA: Diagnosis present

## 2023-11-19 DIAGNOSIS — K631 Perforation of intestine (nontraumatic): Secondary | ICD-10-CM | POA: Diagnosis not present

## 2023-11-19 DIAGNOSIS — G9341 Metabolic encephalopathy: Secondary | ICD-10-CM | POA: Diagnosis present

## 2023-11-19 DIAGNOSIS — Z880 Allergy status to penicillin: Secondary | ICD-10-CM

## 2023-11-19 DIAGNOSIS — K651 Peritoneal abscess: Secondary | ICD-10-CM | POA: Diagnosis present

## 2023-11-19 DIAGNOSIS — R739 Hyperglycemia, unspecified: Secondary | ICD-10-CM | POA: Diagnosis present

## 2023-11-19 DIAGNOSIS — J9602 Acute respiratory failure with hypercapnia: Secondary | ICD-10-CM | POA: Diagnosis not present

## 2023-11-19 DIAGNOSIS — G2581 Restless legs syndrome: Secondary | ICD-10-CM | POA: Diagnosis present

## 2023-11-19 DIAGNOSIS — R198 Other specified symptoms and signs involving the digestive system and abdomen: Secondary | ICD-10-CM

## 2023-11-19 DIAGNOSIS — K219 Gastro-esophageal reflux disease without esophagitis: Secondary | ICD-10-CM | POA: Diagnosis present

## 2023-11-19 DIAGNOSIS — Z96643 Presence of artificial hip joint, bilateral: Secondary | ICD-10-CM | POA: Diagnosis present

## 2023-11-19 DIAGNOSIS — J9601 Acute respiratory failure with hypoxia: Secondary | ICD-10-CM | POA: Diagnosis not present

## 2023-11-19 DIAGNOSIS — M4802 Spinal stenosis, cervical region: Secondary | ICD-10-CM | POA: Diagnosis present

## 2023-11-19 LAB — URINALYSIS, MICROSCOPIC (REFLEX)

## 2023-11-19 LAB — CBC
HCT: 36.7 % — ABNORMAL LOW (ref 39.0–52.0)
Hemoglobin: 12 g/dL — ABNORMAL LOW (ref 13.0–17.0)
MCH: 32.3 pg (ref 26.0–34.0)
MCHC: 32.7 g/dL (ref 30.0–36.0)
MCV: 98.9 fL (ref 80.0–100.0)
Platelets: 214 K/uL (ref 150–400)
RBC: 3.71 MIL/uL — ABNORMAL LOW (ref 4.22–5.81)
RDW: 13.3 % (ref 11.5–15.5)
WBC: 7.9 K/uL (ref 4.0–10.5)
nRBC: 0 % (ref 0.0–0.2)

## 2023-11-19 LAB — COMPREHENSIVE METABOLIC PANEL WITH GFR
ALT: 58 U/L — ABNORMAL HIGH (ref 0–44)
AST: 29 U/L (ref 15–41)
Albumin: 3.4 g/dL — ABNORMAL LOW (ref 3.5–5.0)
Alkaline Phosphatase: 59 U/L (ref 38–126)
Anion gap: 13 (ref 5–15)
BUN: 66 mg/dL — ABNORMAL HIGH (ref 8–23)
CO2: 21 mmol/L — ABNORMAL LOW (ref 22–32)
Calcium: 8.8 mg/dL — ABNORMAL LOW (ref 8.9–10.3)
Chloride: 104 mmol/L (ref 98–111)
Creatinine, Ser: 2.1 mg/dL — ABNORMAL HIGH (ref 0.61–1.24)
GFR, Estimated: 33 mL/min — ABNORMAL LOW (ref >60)
Glucose, Bld: 212 mg/dL — ABNORMAL HIGH (ref 70–99)
Potassium: 5.7 mmol/L — ABNORMAL HIGH (ref 3.5–5.1)
Sodium: 138 mmol/L (ref 135–145)
Total Bilirubin: 0.3 mg/dL (ref 0.0–1.2)
Total Protein: 5.6 g/dL — ABNORMAL LOW (ref 6.5–8.1)

## 2023-11-19 LAB — URINALYSIS, ROUTINE W REFLEX MICROSCOPIC
Glucose, UA: NEGATIVE mg/dL
Hgb urine dipstick: NEGATIVE
Ketones, ur: 15 mg/dL — AB
Nitrite: POSITIVE — AB
Protein, ur: 30 mg/dL — AB
Specific Gravity, Urine: 1.025 (ref 1.005–1.030)
pH: 5 (ref 5.0–8.0)

## 2023-11-19 LAB — LACTIC ACID, PLASMA
Lactic Acid, Venous: 3.1 mmol/L (ref 0.5–1.9)
Lactic Acid, Venous: 2.5 mmol/L (ref 0.5–1.9)

## 2023-11-19 LAB — LIPASE, BLOOD: Lipase: 32 U/L (ref 11–51)

## 2023-11-19 LAB — CBG MONITORING, ED: Glucose-Capillary: 157 mg/dL — ABNORMAL HIGH (ref 70–99)

## 2023-11-19 MED ORDER — LACTATED RINGERS IV BOLUS
1000.0000 mL | Freq: Once | INTRAVENOUS | Status: AC
  Administered 2023-11-19: 1000 mL via INTRAVENOUS

## 2023-11-19 MED ORDER — HYDROMORPHONE HCL 1 MG/ML IJ SOLN
0.5000 mg | INTRAMUSCULAR | Status: DC | PRN
  Administered 2023-11-20 (×2): 1 mg via INTRAVENOUS
  Filled 2023-11-19 (×2): qty 1

## 2023-11-19 MED ORDER — SALINE SPRAY 0.65 % NA SOLN: 1.0000 | Freq: Four times a day (QID) | NASAL | Status: DC | PRN

## 2023-11-19 MED ORDER — HYDROMORPHONE HCL 1 MG/ML IJ SOLN
1.0000 mg | Freq: Once | INTRAMUSCULAR | Status: AC
  Administered 2023-11-19: 1 mg via INTRAVENOUS
  Filled 2023-11-19: qty 1

## 2023-11-19 MED ORDER — METRONIDAZOLE 500 MG/100ML IV SOLN
500.0000 mg | Freq: Two times a day (BID) | INTRAVENOUS | Status: DC
  Administered 2023-11-20 – 2023-11-21 (×3): 500 mg via INTRAVENOUS
  Filled 2023-11-19 (×3): qty 100

## 2023-11-19 MED ORDER — ONDANSETRON HCL 4 MG PO TABS: 4.0000 mg | ORAL_TABLET | Freq: Four times a day (QID) | ORAL | Status: DC | PRN

## 2023-11-19 MED ORDER — VANCOMYCIN HCL 500 MG IV SOLR
500.0000 mg | Freq: Once | INTRAVENOUS | Status: DC
  Filled 2023-11-19: qty 10

## 2023-11-19 MED ORDER — SODIUM CHLORIDE 0.9 % IV BOLUS
1000.0000 mL | Freq: Once | INTRAVENOUS | Status: AC
  Administered 2023-11-19: 1000 mL via INTRAVENOUS

## 2023-11-19 MED ORDER — INSULIN ASPART 100 UNIT/ML IV SOLN
5.0000 [IU] | Freq: Once | INTRAVENOUS | Status: AC
  Administered 2023-11-19: 5 [IU] via INTRAVENOUS
  Filled 2023-11-19: qty 5

## 2023-11-19 MED ORDER — ACETAMINOPHEN 650 MG RE SUPP: 650.0000 mg | Freq: Four times a day (QID) | RECTAL | Status: DC | PRN

## 2023-11-19 MED ORDER — SODIUM CHLORIDE 0.9 % IV SOLN
2.0000 g | Freq: Once | INTRAVENOUS | Status: AC
  Administered 2023-11-19: 2 g via INTRAVENOUS
  Filled 2023-11-19: qty 20

## 2023-11-19 MED ORDER — SODIUM CHLORIDE 0.9 % IV SOLN: INTRAVENOUS | Status: DC

## 2023-11-19 MED ORDER — MAGIC MOUTHWASH
15.0000 mL | Freq: Four times a day (QID) | ORAL | Status: DC | PRN
  Administered 2023-11-21: 15 mL via ORAL
  Filled 2023-11-19: qty 15

## 2023-11-19 MED ORDER — SODIUM CHLORIDE 0.9 % IV SOLN
2.0000 g | Freq: Two times a day (BID) | INTRAVENOUS | Status: DC
  Administered 2023-11-20: 2 g via INTRAVENOUS
  Filled 2023-11-19: qty 12.5

## 2023-11-19 MED ORDER — SODIUM CHLORIDE 0.9 % IV BOLUS
500.0000 mL | Freq: Once | INTRAVENOUS | Status: AC
  Administered 2023-11-20: 500 mL via INTRAVENOUS

## 2023-11-19 MED ORDER — INSULIN ASPART 100 UNIT/ML IJ SOLN
0.0000 [IU] | INTRAMUSCULAR | Status: DC
  Administered 2023-11-23: 1 [IU] via SUBCUTANEOUS
  Administered 2023-11-27 – 2023-11-28 (×2): 2 [IU] via SUBCUTANEOUS
  Administered 2023-11-28 – 2023-11-29 (×4): 1 [IU] via SUBCUTANEOUS
  Administered 2023-11-29: 2 [IU] via SUBCUTANEOUS
  Administered 2023-11-29: 1 [IU] via SUBCUTANEOUS
  Filled 2023-11-19 (×2): qty 2
  Filled 2023-11-19 (×3): qty 1
  Filled 2023-11-19: qty 2
  Filled 2023-11-19: qty 5
  Filled 2023-11-19: qty 3
  Filled 2023-11-19 (×2): qty 2
  Filled 2023-11-19: qty 1

## 2023-11-19 MED ORDER — NAPHAZOLINE-GLYCERIN 0.012-0.25 % OP SOLN: 1.0000 [drp] | Freq: Four times a day (QID) | OPHTHALMIC | Status: DC | PRN

## 2023-11-19 MED ORDER — SODIUM CHLORIDE 0.9% FLUSH
3.0000 mL | Freq: Two times a day (BID) | INTRAVENOUS | Status: DC
  Administered 2023-11-20 – 2023-11-29 (×17): 3 mL via INTRAVENOUS

## 2023-11-19 MED ORDER — PANTOPRAZOLE SODIUM 40 MG IV SOLR
40.0000 mg | Freq: Two times a day (BID) | INTRAVENOUS | Status: DC
  Administered 2023-11-19 – 2023-11-25 (×13): 40 mg via INTRAVENOUS
  Filled 2023-11-19 (×13): qty 10

## 2023-11-19 MED ORDER — SODIUM CHLORIDE 0.9 % IV SOLN: Freq: Once | INTRAVENOUS | Status: AC

## 2023-11-19 MED ORDER — ONDANSETRON HCL 4 MG/2ML IJ SOLN
4.0000 mg | Freq: Four times a day (QID) | INTRAMUSCULAR | Status: DC | PRN
  Administered 2023-11-21 – 2023-11-24 (×5): 4 mg via INTRAVENOUS
  Filled 2023-11-19 (×4): qty 2

## 2023-11-19 MED ORDER — PHENOL 1.4 % MT LIQD
2.0000 | OROMUCOSAL | Status: DC | PRN
  Administered 2023-11-26: 2 via OROMUCOSAL
  Filled 2023-11-19: qty 177

## 2023-11-19 MED ORDER — METRONIDAZOLE 500 MG/100ML IV SOLN
500.0000 mg | Freq: Once | INTRAVENOUS | Status: AC
  Administered 2023-11-19: 500 mg via INTRAVENOUS
  Filled 2023-11-19: qty 100

## 2023-11-19 MED ORDER — VANCOMYCIN HCL 500 MG/100ML IV SOLN
500.0000 mg | Freq: Once | INTRAVENOUS | Status: AC
  Administered 2023-11-20: 500 mg via INTRAVENOUS
  Filled 2023-11-19: qty 100

## 2023-11-19 MED ORDER — ACETAMINOPHEN 325 MG PO TABS: 650.0000 mg | ORAL_TABLET | Freq: Four times a day (QID) | ORAL | Status: DC | PRN

## 2023-11-19 MED ORDER — DEXTROSE 50 % IV SOLN
1.0000 | Freq: Once | INTRAVENOUS | Status: AC
  Administered 2023-11-19: 50 mL via INTRAVENOUS
  Filled 2023-11-19: qty 50

## 2023-11-19 MED ORDER — PROCHLORPERAZINE EDISYLATE 10 MG/2ML IJ SOLN
5.0000 mg | INTRAMUSCULAR | Status: DC | PRN
  Administered 2023-11-21 – 2023-11-24 (×2): 5 mg via INTRAVENOUS
  Filled 2023-11-19 (×2): qty 2

## 2023-11-19 MED ORDER — VANCOMYCIN HCL IN DEXTROSE 1-5 GM/200ML-% IV SOLN
1000.0000 mg | Freq: Once | INTRAVENOUS | Status: AC
  Administered 2023-11-19: 1000 mg via INTRAVENOUS
  Filled 2023-11-19: qty 200

## 2023-11-19 MED ORDER — MENTHOL 3 MG MT LOZG: 1.0000 | LOZENGE | OROMUCOSAL | Status: DC | PRN

## 2023-11-19 MED ORDER — SODIUM CHLORIDE 0.9 % IV SOLN: INTRAVENOUS | Status: AC | PRN

## 2023-11-19 MED ORDER — FENTANYL CITRATE (PF) 50 MCG/ML IJ SOSY
50.0000 ug | PREFILLED_SYRINGE | Freq: Once | INTRAMUSCULAR | Status: AC
  Administered 2023-11-19: 50 ug via INTRAVENOUS
  Filled 2023-11-19: qty 1

## 2023-11-19 NOTE — ED Provider Notes (Signed)
 [place] [facility] Provider Note   CSN: [id number] Arrival date & time: 11/19/23  1604     Patient presents with: Abdominal Pain   EINER MEALS is a 68 y.o. male.   Patient here with abdominal pain and bloating since this morning.  Denies any nausea vomit diarrhea.  No chest pain.  Nothing makes it worse or better.  He had a normal bowel movement yesterday.  He has a history of high blood pressure.  Denies any fevers or chills.  Denies any headache weakness numbness tingling.  Denies any abdominal surgical history.  He does have history of hepatitis.  Denies any alcohol use.  The history is provided by the patient.       Prior to Admission medications   Medication Sig Start Date End Date Taking? Authorizing Provider  lisinopril-hydrochlorothiazide (PRINZIDE,ZESTORETIC) 20-25 MG tablet Take 1.5 tablets by mouth daily.  03/27/15   [provider]  methylPREDNISolone  (MEDROL ) 4 MG tablet Take 6 tablets day 1, take 5 tablets day 2, take 4 tablets day 3, take 3 tablets day 3, take 2 tablets day 2, take 1 tablet day 6 10/18/23   Gretta Bertrum ORN, PA-C  traMADol (ULTRAM) 50 MG tablet Take 1 tablet (50 mg total) by mouth every 6 (six) hours as needed. 10/18/23   Gretta Bertrum ORN, PA-C    Allergies: Codeine and Penicillins    Review of Systems  Updated Vital Signs BP (!) 150/65   Pulse 69   Temp 98 F (36.7 C) (Oral)   Resp 17   Ht 5' 5 (1.651 m)   Wt 83 kg   SpO2 100%   BMI 30.45 kg/m   Physical Exam Vitals and nursing note reviewed.  Constitutional:      General: He is not in acute distress.    Appearance: He is well-developed. He is not ill-appearing.  HENT:     Head: Normocephalic and atraumatic.     Mouth/Throat:     Mouth: Mucous membranes are moist.  Eyes:     Extraocular Movements: Extraocular movements intact.     Conjunctiva/sclera: Conjunctivae normal.     Pupils: Pupils are equal, round, and reactive to  light.  Cardiovascular:     Rate and Rhythm: Normal rate and regular rhythm.     Heart sounds: Normal heart sounds. No murmur heard. Pulmonary:     Effort: Pulmonary effort is normal. No respiratory distress.     Breath sounds: Normal breath sounds.  Abdominal:     General: There is distension.     Palpations: Abdomen is soft.     Tenderness: There is generalized abdominal tenderness.  Musculoskeletal:        General: No swelling.     Cervical back: Neck supple.  Skin:    General: Skin is warm and dry.     Capillary Refill: Capillary refill takes less than 2 seconds.  Neurological:     Mental Status: He is alert.  Psychiatric:        Mood and Affect: Mood normal.     (all labs ordered are listed, but only abnormal results are displayed) Labs Reviewed  COMPREHENSIVE METABOLIC PANEL WITH GFR - Abnormal; Notable for the following components:      Result Value   Potassium 5.7 (*)    CO2 21 (*)    Glucose, Bld 212 (*)    BUN 66 (*)    Creatinine, Ser 2.10 (*)    Calcium 8.8 (*)  Total Protein 5.6 (*)    Albumin 3.4 (*)    ALT 58 (*)    GFR, Estimated 33 (*)    All other components within normal limits  CBC - Abnormal; Notable for the following components:   RBC 3.71 (*)    Hemoglobin 12.0 (*)    HCT 36.7 (*)    All other components within normal limits  URINALYSIS, ROUTINE W REFLEX MICROSCOPIC - Abnormal; Notable for the following components:   APPearance CLOUDY (*)    Bilirubin Urine SMALL (*)    Ketones, ur 15 (*)    Protein, ur 30 (*)    Nitrite POSITIVE (*)    Leukocytes,Ua TRACE (*)    All other components within normal limits  URINALYSIS, MICROSCOPIC (REFLEX) - Abnormal; Notable for the following components:   Bacteria, UA MANY (*)    Non Squamous Epithelial PRESENT (*)    All other components within normal limits  CBG MONITORING, ED - Abnormal; Notable for the following components:   Glucose-Capillary 157 (*)    All other components within normal limits   URINE CULTURE  CULTURE, BLOOD (ROUTINE X 2)  CULTURE, BLOOD (ROUTINE X 2)  LIPASE, BLOOD  LACTIC ACID, PLASMA  LACTIC ACID, PLASMA  CBC    EKG: EKG Interpretation Date/Time:  Friday November 19 2023 17:44:27 EST Ventricular Rate:  73 PR Interval:  153 QRS Duration:  93 QT Interval:  427 QTC Calculation: 471 R Axis:   55  Text Interpretation: Sinus rhythm Ventricular premature complex Confirmed by Ruthe Cornet [phone]) on 11/19/2023 5:47:20 PM  Radiology: CT ABDOMEN PELVIS WO CONTRAST Result Date: 11/19/2023 EXAM: CT ABDOMEN AND PELVIS WITHOUT CONTRAST 11/19/2023 06:08:57 PM TECHNIQUE: CT of the abdomen and pelvis was performed without the administration of intravenous contrast. Multiplanar reformatted images are provided for review. Automated exposure control, iterative reconstruction, and/or weight-based adjustment of the mA/kV was utilized to reduce the radiation dose to as low as reasonably achievable. COMPARISON: CT 07/24/2019 CLINICAL HISTORY: Abdominal pain, acute, nonlocalized. FINDINGS: LOWER CHEST: No acute abnormality. LIVER: Small collection of gas adjacent to the falciform ligament of the liver on image 30/2. Small amount of gas along the capsule of the liver on image 26. GALLBLADDER AND BILE DUCTS: Gallbladder is unremarkable. No biliary ductal dilatation. SPLEEN: No acute abnormality. PANCREAS: No acute abnormality. ADRENAL GLANDS: No acute abnormality. KIDNEYS, URETERS AND BLADDER: No stones in the kidneys or ureters. No hydronephrosis. No perinephric or periureteral stranding. Bladder poorly evaluated due to streak artifact from the bilateral hip prosthetics. GI AND BOWEL: Stomach demonstrates no acute abnormality. There is no bowel obstruction. There is a small collection of gas positioned between the first portion of the duodenum and the left hepatic lobe on image 30 of series 2. This is seen on coronal image 70 of series 8. There are multiple diverticula of the sigmoid  colon but no evidence of acute inflammation or perforation. PERITONEUM AND RETROPERITONEUM: There is a small amount of intraperitoneal free air collecting predominantly in the upper abdomen along the ventral peritoneum. For example, small collection of gas adjacent to the falciform ligament of the liver on image 30/2. Small amount of gas along the capsule of the liver on image 26. There is a small collection of gas positioned between the first portion of the duodenum and the left hepatic lobe on image 30 of series 2. This is seen on coronal image 70 of series 8. There is mild inflammatory stranding in the right upper quadrant. A small  amount of fluid along the right anterior pararenal fascia. There is trace intraperitoneal free fluid in the pelvis on the right on image 69/2. VASCULATURE: Aorta is normal in caliber. LYMPH NODES: No lymphadenopathy. REPRODUCTIVE ORGANS: No acute abnormality. BONES AND SOFT TISSUES: No acute osseous abnormality. No focal soft tissue abnormality. Small amount of intraperitoneal free air predominantly in the upper abdomen with mild inflammatory stranding in the right upper quadrant. Findings are consistent with perforation of the hollow viscus. Most likely source would be proximal duodenum related peptic ulcer disease. Secondary source although less likely would be perforated sigmoid diverticulum. Free fluid in the pelvis. Recommend emergent surgical consultation. Findings conveyed to ordering physician Ruthe, MD at time of interpretation. IMPRESSION: 1. Small amount of intraperitoneal free air predominantly in the upper abdomen with mild inflammatory stranding in the right upper quadrant, consistent with hollow viscus perforation. Most likely source is proximal duodenum (peptic ulcer disease). A perforated sigmoid diverticulum is less likely. 2. Trace intraperitoneal free fluid in the pelvis. 3. Recommend emergent surgical consultation. 4. Findings conveyed to ordering physician  Ruthe, MD at time of interpretation. Electronically signed by: Norleen Boxer MD 11/19/2023 07:02 PM EST RP Workstation: [id number]     Procedures   Medications Ordered in the ED  0.9 %  sodium chloride  infusion (has no administration in time range)  insulin aspart (novoLOG) injection 5 Units (has no administration in time range)    And  dextrose  50 % solution 50 mL (has no administration in time range)  vancomycin (VANCOCIN) IVPB 1000 mg/200 mL premix (has no administration in time range)    Followed by  vancomycin (VANCOCIN) 500 mg in sodium chloride  0.9 % 100 mL IVPB (has no administration in time range)  metroNIDAZOLE  (FLAGYL ) IVPB 500 mg (has no administration in time range)  lactated ringers  bolus 1,000 mL (has no administration in time range)  pantoprazole (PROTONIX) injection 40 mg (has no administration in time range)  menthol  (CEPACOL) lozenge 3 mg (has no administration in time range)  phenol (CHLORASEPTIC) mouth spray 2 spray (has no administration in time range)  magic mouthwash (has no administration in time range)  naphazoline-glycerin (CLEAR EYES REDNESS) ophth solution 1-2 drop (has no administration in time range)  sodium chloride  (OCEAN) 0.65 % nasal spray 1-2 spray (has no administration in time range)  HYDROmorphone  (DILAUDID ) injection 1 mg (has no administration in time range)  fentaNYL  (SUBLIMAZE ) injection 50 mcg (50 mcg Intravenous Given 11/19/23 1739)  sodium chloride  0.9 % bolus 1,000 mL (0 mLs Intravenous Stopped 11/19/23 1929)  sodium chloride  0.9 % bolus 1,000 mL (1,000 mLs Intravenous New Bag/Given 11/19/23 1837)  cefTRIAXone (ROCEPHIN) 2 g in sodium chloride  0.9 % 100 mL IVPB (0 g Intravenous Stopped 11/19/23 1929)                                    Medical Decision Making Amount and/or Complexity of Data Reviewed Labs: ordered. Radiology: ordered.  Risk OTC drugs. Prescription drug management. Decision regarding hospitalization.   WILLIAMS DIETRICK is here with abdominal pain and bloating.  Normal vitals.  No fever.  Per chart review looks like maybe history of hepatitis but denies any alcohol use.  He denies any nausea vomiting diarrhea.  Pain started today.  He feels bloated.  He thinks he had a normal bowel movement yesterday.  He is not having any chest pain fever or chills.  He  has got some distention of his abdomen on exam some mild tenderness throughout.  Got normal vitals.  No fever.  He is somewhat well-appearing otherwise.  Overall differential diagnosis could be bowel obstruction versus colitis.  Titus or some other intra-abdominal process.  Could be constipation.  Will get a CT scan abdomen pelvis check basic labs give IV fluids IV fentanyl  and reevaluate.  Have no concern for dissection or cardiac process given history and physical  Overall lab work per my review and interpretation shows a potassium 5.7 and AKI with a creatinine of 2.1.  However no hyperkalemic changes on EKG.  Will give insulin and dextrose  for hyperkalemia.  Urinalysis looks consistent for infection.  But patient has no significant leukocytosis.  Urinalysis to be sent for culture.  Will give a dose of Rocephin.  CT scan per radiology report however shows per radiology report on the phone a small amount of intraperitoneal free air in the upper abdomen likely concerning for perforated viscus likely proximal duodenum.  Overall we will broaden out antibiotics to vancomycin and Zosyn.  He does not have fever does not have a white count.  I have no concern for sepsis.  Will talk with general surgery about this.  Will get cultures even though he is on antibiotics already will get a lactic acid.  Talked with Dr. Sheldon of general surgery.  He recommends admission to medicine NG tube pain control.  Seems like he is hemodynamically stable at this time.  If I can get him a bed request to [facility] fairly reasonably I will have him directly admitted to his bed.  I will talk  to hospitalist for this admission.  Will initiate a ED to ED transfer if we anticipate prolonged admission/any type of decompensation but right now patient is hemodynamically stable.  Well-appearing.  Will give additional pain medicine.  Patient aware of the plan.  This chart was dictated using voice recognition software.  Despite best efforts to proofread,  errors can occur which can change the documentation meaning.      Final diagnoses:  AKI (acute kidney injury)  Perforated abdominal viscus    ED Discharge Orders     None          Ruthe Cornet, DO 11/19/23 1934

## 2023-11-19 NOTE — Progress Notes (Signed)
 Plan of Care Note for accepted transfer  Patient: Theodore Stevenson    FMW:[id number]  DOA: 11/19/2023     Facility requesting transfer: [facility] Requesting Provider: Dr. 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  Patient explained that pain primarily began earlier in the day, rapidly became severe in intensity and diffuse without any associated nausea vomiting or diarrhea.  Upon evaluation in the emergency department initial urinalysis was found to be nitrite positive with some leukocytes and therefore patient was administered a dose of Rocephin by the EDP.  CT imaging of the abdomen and pelvis revealed a small amount of intraperitoneal free air predominantly in the upper abdomen with mild inflammatory stranding in the right upper quadrant concerning for viscus perforation with source thought to be possibly the proximal duodenum.  EDP discussed case with Dr. Sheldon with general surgery who recommended transfer to [facility] for surgical consultation.  He recommended the hospitalist be primary simply because it was not clear that the patient immediately needed surgical intervention in the absence of significant leukocytosis, hemodynamic instability or other urgent clinical signs on initial evaluation.  Of note, patient also found to have mild hyperkalemia and renal injury.  Patient initiated on intravenous fluids.  Patient has been accepted to a a telemetry bed at [facility].  Patient should have chemistry repeated and surgery notified for bedside evaluation upon arrival.  Patient will remain n.p.o. and needs an NG tube as well.  Plan of care: The patient is accepted for admission to Telemetry unit, at [facility].    Author: Zachary JINNY Ba, MD  11/19/2023  Check [email or web address] for on-call coverage.  Nursing staff, Please call [facility] Admits & Consults System-Wide number on Amion as soon as patient's arrival, so appropriate admitting provider can evaluate the pt.

## 2023-11-19 NOTE — Progress Notes (Signed)
 ED Pharmacy Antibiotic Sign Off An antibiotic consult was received from an ED provider for vancomycin per pharmacy dosing for intra-abdominal infection. A chart review was completed to assess appropriateness.   The following one time order(s) were placed:  Vancomycin 1500 mg IV x 1  Further antibiotic and/or antibiotic pharmacy consults should be ordered by the admitting provider if indicated.   Thank you for allowing pharmacy to be a part of this patient's care.   Dorn Poot, [facility]  Clinical Pharmacist 11/19/23 7:23 PM

## 2023-11-19 NOTE — ED Notes (Signed)
 [facility] called for transport.

## 2023-11-19 NOTE — H&P (Signed)
 History and Physical    Theodore Stevenson FMW:[id number] DOB: January 06, 1956 DOA: 11/19/2023  PCP: Patient, No Pcp Per   Patient coming from: Home   Chief Complaint: Abdominal pain   HPI: Theodore Stevenson is a 68 y.o. male with medical history significant for hypertension, spinal stenosis, and chronic back pain who presents with severe abdominal pain.  Patient reports a few days of bloating and some indigestion symptoms.  He then developed severe pain in the right upper abdomen early this morning.  He denies nausea, vomiting, or diarrhea.  He denies fevers or chills.  He was previously using Aleve and ibuprofen, but not recently.  He takes an over-the-counter analgesic called pain away that contains aspirin.  [facility] ED Course: Upon arrival to the ED, patient is found to be afebrile and saturating well on room air with normal HR and elevated BP.  Labs are most notable for potassium 5.7, BUN 66, creatinine 2.10, glucose 211, normal WBC, and lactate 3.1.  CT is concerning for small amount of intraperitoneal free air in the upper abdomen with RUQ inflammation most concerning for proximal duodenal perforation.  Surgery was consulted by the ED physician, NG tube was placed, urine and blood cultures were collected, and the patient was given 2 L IVF, IV Protonix, insulin with dextrose , Rocephin, Flagyl , vancomycin, Dilaudid , and fentanyl .  He was transferred to [facility] for admission.  Review of Systems:  All other systems reviewed and apart from HPI, are negative.  Past Medical History:  Diagnosis Date   Arthritis    Hepatitis    1973   Hypertension    Spinal stenosis     Past Surgical History:  Procedure Laterality Date   BACK SURGERY     1987   CARPAL TUNNEL RELEASE     right and left   HIP SURGERY  06/30/2011   total left hip   JOINT REPLACEMENT     right total hip   TOTAL HIP ARTHROPLASTY  06/30/2011   Procedure: TOTAL HIP ARTHROPLASTY;  Surgeon: Maude LELON Right, MD;   Location: [facility] OR;  Service: Orthopedics;  Laterality: Left;    Social History:   reports that he quit smoking about 17 years ago. His smoking use included cigarettes. He started smoking about 27 years ago. He has a 10 pack-year smoking history. His smokeless tobacco use includes chew. He reports that he does not drink alcohol and does not use drugs.  Allergies  Allergen Reactions   Codeine Nausea And Vomiting   Penicillins     Pt was young unsure exactly      History reviewed. No pertinent family history.   Prior to Admission medications   Medication Sig Start Date End Date Taking? Authorizing Provider  lisinopril-hydrochlorothiazide (PRINZIDE,ZESTORETIC) 20-25 MG tablet Take 1.5 tablets by mouth daily.  03/27/15   [provider]  methylPREDNISolone  (MEDROL ) 4 MG tablet Take 6 tablets day 1, take 5 tablets day 2, take 4 tablets day 3, take 3 tablets day 3, take 2 tablets day 2, take 1 tablet day 6 10/18/23   Gretta Bertrum LELON, PA-C  traMADol (ULTRAM) 50 MG tablet Take 1 tablet (50 mg total) by mouth every 6 (six) hours as needed. 10/18/23   Gretta Bertrum LELON, PA-C    Physical Exam: Vitals:   11/19/23 2028 11/19/23 2030 11/19/23 2217 11/19/23 2335  BP: (!) 148/102 (!) 160/92  134/68  Pulse: 98 100    Resp: 20 (!) 22  18  Temp: 98.4 F (  36.9 C)  98.4 F (36.9 C) (!) 100.5 F (38.1 C)  TempSrc: Oral  Oral Oral  SpO2: 98% 99%  92%  Weight:      Height:         Constitutional: NAD, no pallor or diaphoresis  Eyes: PERTLA, lids and conjunctivae normal ENMT: Mucous membranes are moist. Posterior pharynx clear of any exudate or lesions.   Neck: supple, no masses  Respiratory: no wheezing, no crackles. No accessory muscle use.  Cardiovascular: S1 & S2 heard, regular rate and rhythm. No JVD. Abdomen: Soft, tender in RUQ. Bowel sounds active.  Musculoskeletal: no clubbing / cyanosis. No joint deformity upper and lower extremities.   Skin: no significant rashes, lesions,  ulcers. Warm, dry, well-perfused. Neurologic: CN 2-12 grossly intact. Moving all extremities. Alert and oriented.  Psychiatric: Calm. Cooperative.    Labs and Imaging on Admission: I have personally reviewed following labs and imaging studies  CBC: Recent Labs  Lab 11/19/23 1619  WBC 7.9  HGB 12.0*  HCT 36.7*  MCV 98.9  PLT 214   Basic Metabolic Panel: Recent Labs  Lab 11/19/23 1619  NA 138  K 5.7*  CL 104  CO2 21*  GLUCOSE 212*  BUN 66*  CREATININE 2.10*  CALCIUM 8.8*   GFR: Estimated Creatinine Clearance: 32.5 mL/min (A) (by C-G formula based on SCr of 2.1 mg/dL (H)). Liver Function Tests: Recent Labs  Lab 11/19/23 1619  AST 29  ALT 58*  ALKPHOS 59  BILITOT 0.3  PROT 5.6*  ALBUMIN 3.4*   Recent Labs  Lab 11/19/23 1619  LIPASE 32   No results for input(s): AMMONIA in the last 168 hours. Coagulation Profile: No results for input(s): INR, PROTIME in the last 168 hours. Cardiac Enzymes: No results for input(s): CKTOTAL, CKMB, CKMBINDEX, TROPONINI in the last 168 hours. BNP (last 3 results) No results for input(s): PROBNP in the last 8760 hours. HbA1C: No results for input(s): HGBA1C in the last 72 hours. CBG: Recent Labs  Lab 11/19/23 1928 11/20/23 0022  GLUCAP 157* 110*   Lipid Profile: No results for input(s): CHOL, HDL, LDLCALC, TRIG, CHOLHDL, LDLDIRECT in the last 72 hours. Thyroid Function Tests: No results for input(s): TSH, T4TOTAL, FREET4, T3FREE, THYROIDAB in the last 72 hours. Anemia Panel: No results for input(s): VITAMINB12, FOLATE, FERRITIN, TIBC, IRON, RETICCTPCT in the last 72 hours. Urine analysis:    Component Value Date/Time   COLORURINE YELLOW 11/19/2023 1616   APPEARANCEUR CLOUDY (A) 11/19/2023 1616   LABSPEC 1.025 11/19/2023 1616   PHURINE 5.0 11/19/2023 1616   GLUCOSEU NEGATIVE 11/19/2023 1616   HGBUR NEGATIVE 11/19/2023 1616   BILIRUBINUR SMALL (A) 11/19/2023 1616    KETONESUR 15 (A) 11/19/2023 1616   PROTEINUR 30 (A) 11/19/2023 1616   UROBILINOGEN 0.2 06/24/2011 1047   NITRITE POSITIVE (A) 11/19/2023 1616   LEUKOCYTESUR TRACE (A) 11/19/2023 1616   Sepsis Labs: @LABRCNTIP (procalcitonin:4,lacticidven:4) )No results found for this or any previous visit (from the past 240 hours).   Radiological Exams on Admission: DG Abd Portable 1 View Result Date: 11/19/2023 EXAM: 1 VIEW XRAY OF THE ABDOMEN 11/19/2023 08:37:00 PM COMPARISON: None available. CLINICAL HISTORY: NGT placement. FINDINGS: LINES, TUBES AND DEVICES: A gastric catheter is noted in the distal stomach. BOWEL: No free air is seen. BONES: No acute osseous abnormality. IMPRESSION: 1. Gastric catheter in the distal stomach. 2. No free intraperitoneal air. Electronically signed by: Oneil Devonshire MD 11/19/2023 08:41 PM EST RP Workstation: [id number]   CT ABDOMEN PELVIS WO CONTRAST  Result Date: 11/19/2023 EXAM: CT ABDOMEN AND PELVIS WITHOUT CONTRAST 11/19/2023 06:08:57 PM TECHNIQUE: CT of the abdomen and pelvis was performed without the administration of intravenous contrast. Multiplanar reformatted images are provided for review. Automated exposure control, iterative reconstruction, and/or weight-based adjustment of the mA/kV was utilized to reduce the radiation dose to as low as reasonably achievable. COMPARISON: CT 07/24/2019 CLINICAL HISTORY: Abdominal pain, acute, nonlocalized. FINDINGS: LOWER CHEST: No acute abnormality. LIVER: Small collection of gas adjacent to the falciform ligament of the liver on image 30/2. Small amount of gas along the capsule of the liver on image 26. GALLBLADDER AND BILE DUCTS: Gallbladder is unremarkable. No biliary ductal dilatation. SPLEEN: No acute abnormality. PANCREAS: No acute abnormality. ADRENAL GLANDS: No acute abnormality. KIDNEYS, URETERS AND BLADDER: No stones in the kidneys or ureters. No hydronephrosis. No perinephric or periureteral stranding. Bladder poorly evaluated  due to streak artifact from the bilateral hip prosthetics. GI AND BOWEL: Stomach demonstrates no acute abnormality. There is no bowel obstruction. There is a small collection of gas positioned between the first portion of the duodenum and the left hepatic lobe on image 30 of series 2. This is seen on coronal image 70 of series 8. There are multiple diverticula of the sigmoid colon but no evidence of acute inflammation or perforation. PERITONEUM AND RETROPERITONEUM: There is a small amount of intraperitoneal free air collecting predominantly in the upper abdomen along the ventral peritoneum. For example, small collection of gas adjacent to the falciform ligament of the liver on image 30/2. Small amount of gas along the capsule of the liver on image 26. There is a small collection of gas positioned between the first portion of the duodenum and the left hepatic lobe on image 30 of series 2. This is seen on coronal image 70 of series 8. There is mild inflammatory stranding in the right upper quadrant. A small amount of fluid along the right anterior pararenal fascia. There is trace intraperitoneal free fluid in the pelvis on the right on image 69/2. VASCULATURE: Aorta is normal in caliber. LYMPH NODES: No lymphadenopathy. REPRODUCTIVE ORGANS: No acute abnormality. BONES AND SOFT TISSUES: No acute osseous abnormality. No focal soft tissue abnormality. Small amount of intraperitoneal free air predominantly in the upper abdomen with mild inflammatory stranding in the right upper quadrant. Findings are consistent with perforation of the hollow viscus. Most likely source would be proximal duodenum related peptic ulcer disease. Secondary source although less likely would be perforated sigmoid diverticulum. Free fluid in the pelvis. Recommend emergent surgical consultation. Findings conveyed to ordering physician Ruthe, MD at time of interpretation. IMPRESSION: 1. Small amount of intraperitoneal free air predominantly in  the upper abdomen with mild inflammatory stranding in the right upper quadrant, consistent with hollow viscus perforation. Most likely source is proximal duodenum (peptic ulcer disease). A perforated sigmoid diverticulum is less likely. 2. Trace intraperitoneal free fluid in the pelvis. 3. Recommend emergent surgical consultation. 4. Findings conveyed to ordering physician Ruthe, MD at time of interpretation. Electronically signed by: Norleen Boxer MD 11/19/2023 07:02 PM EST RP Workstation: [id number]    EKG: Independently reviewed. Sinus rhythm, PVC.   Assessment/Plan   1. Abdominal pain; ?perforated duodenal ulcer  - Appreciate surgery involvement  - Continue NGT decompression, bowel rest, IV PPI, IVF, IV antibiotics, and symptom-management, check H pylori    2. AKI; hyperkalemia  - SCr is 2.10 on admission, previously in 1.1 range; potassium is 5.7  - No hydronephrosis on CT in ED  - Continue  IVF hydration, renally-dose medications, repeat chem panel in am    3. Hyperglycemia  - Reactive vs new DM  - Check A1c, check CBGs, and use low-intensity SSI if needed   4. Hypertension  - Treat as-needed only for now    5. UTI  - Continue cefepime for now, follow culture and clinical course    DVT prophylaxis: SCDs  Code Status: Full  Level of Care: Level of care: Telemetry Family Communication: Wife and 2 daughters at bedside  Disposition Plan:  Patient is from: Home  Anticipated d/c is to: TBD Anticipated d/c date is: 11/23/23 Patient currently: Pending clinical improvement, advancement of diet  Consults called: Surgery  Admission status: Inpatient     Evalene GORMAN Sprinkles, MD [facility]  11/20/2023, 12:43 AM

## 2023-11-19 NOTE — ED Notes (Addendum)
 Attempted to call receiving department  x 3. No answer. [facility] is in route with Patient. Made [facility] aware no contact with department.

## 2023-11-19 NOTE — ED Notes (Addendum)
 [place] MD notified of patient severe pain

## 2023-11-19 NOTE — ED Triage Notes (Signed)
 Severe R abd pain since 0600. No N/V/D but endorses bloating.

## 2023-11-19 NOTE — ED Notes (Signed)
 Spoke to receiving Theodore Stevenson after arrival of patient to In patient department. Gave update and answer questions about patient.

## 2023-11-19 NOTE — ED Notes (Signed)
 Patient transported to CT

## 2023-11-19 NOTE — ED Notes (Signed)
 ED Provider at bedside.

## 2023-11-19 NOTE — Progress Notes (Signed)
 Plan of Care Note (Preliminary telephone/chart evaluation)      Theodore Stevenson  1955-12-24 [id number]  CARE TEAM:  PCP: Patient, No Pcp Per  Outpatient Care Team: Patient Care Team: Patient, No Pcp Per as PCP - General (General Practice)  Inpatient Treatment Team: Treatment Team:  Kenard Zachary PARAS, MD [facility], Md, MD Bobbette Hercules, MD    Surgical advice by telephone at the request of Dr . Ruthe, [facility]  Chief complaint / Reason for evaluation: Abdominal pain question pneumoperitoneum question ulcer   68 year old male that had vague abdominal pain.  Came to ER.  No fever elevated white count.  CAT scan notes a dot of gas on the liver and questionable intra versus extraluminal gas in the duodenum.  No inflammation.  Patient apparently pain-free with no evidence of any tachycardia or shock or sepsis.   Assessment   Problem List:  Principal Problem:   Small bowel perforation (HCC)  Patient without fever or leukocytosis or peritonitis.    Possibility of a microperforation of a duodenal ulcer.  However patient is without any fever or leukocytosis or tachycardic or peritonitis.  Not really having that much discomfort and not focal in the epigastric region.  No strong history of ulcer disease.  CAT scan shows 1 or 2 dots of gas near the liver without inflammation.  Concern around the duodenal sweep looks more intraluminal to me.  NG tube and bowel rest.  IV antibiotics for now.  IV PPI.  Rule out H. pylori.  Not convinced he has a definite perforated ulcer.  Even so there is minimal free air.  Sometimes microperforation can seal conservatively with recommendations above and do not require operative exploration.  However it is wise to admit him and follow closely.  Plans to transfer from [facility] to [facility] long hospital pending  Surgery will help follow.   If he rapidly improves since asymptomatic may gradually advance diet and consider outpatient  Gastroenterology follow-up.    If patient does not improve or clinically worsens, then abdominal laparoscopic exploration to rule out both ulcer and need for patch.   Surgery will help follow.  Completed formal surgical consultation note pending     Elspeth KYM Schultze, MD, FACS, MASCRS Esophageal, Gastrointestinal & Colorectal Surgery Robotic and Minimally Invasive Surgery  [facility] [address], [address] [place], [place] [address] [phone] Fax [phone] Main  CONTACT INFORMATION: Weekday (9AM-5PM): Call [facility] main office at [phone] Weeknight (5PM-9AM) or Weekend/Holiday: Check EPIC Web Links tab & use AMION (password  [email or web address]) for General Surgery [facility] coverage  Please, DO NOT use SecureChat  (it is not reliable communication to reach operating surgeons & will lead to a delay in care).   Epic staff messaging available for outpatient concerns needing 1-2 business day response.      11/19/2023     ########################################################################################################    Past Medical History:  Diagnosis Date   Arthritis    Hepatitis    1973   Hypertension    Spinal stenosis     Past Surgical History:  Procedure Laterality Date   BACK SURGERY     1987   CARPAL TUNNEL RELEASE     right and left   HIP SURGERY  06/30/2011   total left hip   JOINT REPLACEMENT     right total hip   TOTAL HIP ARTHROPLASTY  06/30/2011   Procedure: TOTAL HIP ARTHROPLASTY;  Surgeon: Maude LELON Right,  MD;  Location: [facility] OR;  Service: Orthopedics;  Laterality: Left;    Social History   Socioeconomic History   Marital status: Married    Spouse name: Not on file   Number of children: Not on file   Years of education: Not on file   Highest education level: Not on file  Occupational History   Not on file  Tobacco Use   Smoking status: Former    Current packs/day: 0.00     Average packs/day: 1 pack/day for 10.0 years (10.0 ttl pk-yrs)    Types: Cigarettes    Start date: 06/21/1996    Quit date: 06/22/2006    Years since quitting: 17.4   Smokeless tobacco: Current    Types: Chew  Vaping Use   Vaping status: Never Used  Substance and Sexual Activity   Alcohol use: No   Drug use: No   Sexual activity: Yes  Other Topics Concern   Not on file  Social History Narrative   Not on file   Social Drivers of Health   Financial Resource Strain: Not on file  Food Insecurity: Not on file  Transportation Needs: Not on file  Physical Activity: Not on file  Stress: Not on file  Social Connections: Not on file  Intimate Partner Violence: Not on file    History reviewed. No pertinent family history.  Current Facility-Administered Medications  Medication Dose Route Frequency Provider Last Rate Last Admin   0.9 %  sodium chloride  infusion   Intravenous PRN Shalhoub, George J, MD       magic mouthwash  15 mL Oral QID PRN Sheldon Standing, MD       menthol  (CEPACOL) lozenge 3 mg  1 lozenge Oral PRN Sheldon Standing, MD       naphazoline-glycerin (CLEAR EYES REDNESS) ophth solution 1-2 drop  1-2 drop Both Eyes QID PRN Sheldon Standing, MD       pantoprazole (PROTONIX) injection 40 mg  40 mg Intravenous Q12H Nakeem Murnane, MD   40 mg at 11/19/23 2121   phenol (CHLORASEPTIC) mouth spray 2 spray  2 spray Mouth/Throat PRN Sheldon Standing, MD       sodium chloride  (OCEAN) 0.65 % nasal spray 1-2 spray  1-2 spray Each Nare Q6H PRN Sheldon Standing, MD       vancomycin (VANCOCIN) IVPB 1000 mg/200 mL premix  1,000 mg Intravenous Once Oriet, Jonathan, RPH 200 mL/hr at 11/19/23 2121 1,000 mg at 11/19/23 2121   Followed by   vancomycin (VANCOCIN) 500 mg in sodium chloride  0.9 % 100 mL IVPB  500 mg Intravenous Once Oriet, Jonathan, [facility]       Current Outpatient Medications  Medication Sig Dispense Refill   lisinopril-hydrochlorothiazide (PRINZIDE,ZESTORETIC) 20-25 MG tablet Take 1.5 tablets by  mouth daily.      methylPREDNISolone  (MEDROL ) 4 MG tablet Take 6 tablets day 1, take 5 tablets day 2, take 4 tablets day 3, take 3 tablets day 3, take 2 tablets day 2, take 1 tablet day 6 21 tablet 0   traMADol (ULTRAM) 50 MG tablet Take 1 tablet (50 mg total) by mouth every 6 (six) hours as needed. 30 tablet 0     Allergies  Allergen Reactions   Codeine Nausea And Vomiting   Penicillins     Pt was young unsure exactly       BP (!) 160/92   Pulse 100   Temp 98.4 F (36.9 C) (Oral)   Resp (!) 22   Ht 5' 5 (1.651  m)   Wt 83 kg   SpO2 99%   BMI 30.45 kg/m     Results:   Labs: Results for orders placed or performed during the hospital encounter of 11/19/23 (from the past 48 hours)  Urinalysis, Routine w reflex microscopic -Urine, Clean Catch     Status: Abnormal   Collection Time: 11/19/23  4:16 PM  Result Value Ref Range   Color, Urine YELLOW YELLOW   APPearance CLOUDY (A) CLEAR   Specific Gravity, Urine 1.025 1.005 - 1.030   pH 5.0 5.0 - 8.0   Glucose, UA NEGATIVE NEGATIVE mg/dL   Hgb urine dipstick NEGATIVE NEGATIVE   Bilirubin Urine SMALL (A) NEGATIVE   Ketones, ur 15 (A) NEGATIVE mg/dL   Protein, ur 30 (A) NEGATIVE mg/dL   Nitrite POSITIVE (A) NEGATIVE   Leukocytes,Ua TRACE (A) NEGATIVE    Comment: Performed at [facility], [address] [place], [place] [address]  Urinalysis, Microscopic (reflex)     Status: Abnormal   Collection Time: 11/19/23  4:16 PM  Result Value Ref Range   RBC / HPF 0-5 0 - 5 RBC/hpf   WBC, UA 6-10 0 - 5 WBC/hpf   Bacteria, UA MANY (A) NONE SEEN   Squamous Epithelial / HPF 0-5 0 - 5 /HPF   Non Squamous Epithelial PRESENT (A) NONE SEEN    Comment: Performed at [facility], [address] [place], [place] [address]  Lipase, blood     Status: None   Collection Time: 11/19/23  4:19 PM  Result Value Ref Range   Lipase 32 11 - 51 U/L    Comment: Performed at [facility], [address] [place], [place] [address]  Comprehensive metabolic panel     Status: Abnormal   Collection Time: 11/19/23  4:19 PM  Result Value Ref Range   Sodium 138 135 - 145 mmol/L   Potassium 5.7 (H) 3.5 - 5.1 mmol/L   Chloride 104 98 - 111 mmol/L   CO2 21 (L) 22 - 32 mmol/L   Glucose, Bld 212 (H) 70 - 99 mg/dL    Comment: Glucose reference range applies only to samples taken after fasting for at least 8 hours.   BUN 66 (H) 8 - 23 mg/dL   Creatinine, Ser 7.89 (H) 0.61 - 1.24 mg/dL   Calcium 8.8 (L) 8.9 - 10.3 mg/dL   Total Protein 5.6 (L) 6.5 - 8.1 g/dL   Albumin 3.4 (L) 3.5 - 5.0 g/dL   AST 29 15 - 41 U/L   ALT 58 (H) 0 - 44 U/L   Alkaline Phosphatase 59 38 - 126 U/L   Total Bilirubin 0.3 0.0 - 1.2 mg/dL   GFR, Estimated 33 (L) >60 mL/min    Comment: (NOTE) Calculated using the CKD-EPI Creatinine Equation (2021)    Anion gap 13 5 - 15    Comment: Performed at [facility], [address] [place], [place] [address]  CBC     Status: Abnormal   Collection Time: 11/19/23  4:19 PM  Result Value Ref Range   WBC 7.9 4.0 - 10.5 K/uL   RBC 3.71 (L) 4.22 - 5.81 MIL/uL   Hemoglobin 12.0 (L) 13.0 - 17.0 g/dL   HCT 63.2 (L) 60.9 - 47.9 %   MCV 98.9 80.0 - 100.0 fL   MCH 32.3 26.0 - 34.0 pg   MCHC 32.7 30.0 - 36.0 g/dL   RDW 86.6 88.4 -  15.5 %   Platelets 214 150 - 400 K/uL   nRBC 0.0 0.0 - 0.2 %    Comment: Performed at [facility], [address] [place], [place] [address]  CBG monitoring, ED     Status: Abnormal   Collection Time: 11/19/23  7:28 PM  Result Value Ref Range   Glucose-Capillary 157 (H) 70 - 99 mg/dL    Comment: Glucose reference range applies only to samples taken after fasting for at least 8 hours.  Lactic acid, plasma     Status: Abnormal   Collection Time: 11/19/23  7:40 PM  Result Value Ref Range   Lactic Acid, Venous 3.1 (HH) 0.5 - 1.9 mmol/L    Comment: Critical Value, Read Back and verified with Louie RN at 2034 on 11/19/23 by [email or web address] Performed at [facility], [address] [place], [place] [address]     Imaging / Studies: DG Abd Portable 1 View Result Date: 11/19/2023 EXAM: 1 VIEW XRAY OF THE ABDOMEN 11/19/2023 08:37:00 PM COMPARISON: None available. CLINICAL HISTORY: NGT placement. FINDINGS: LINES, TUBES AND DEVICES: A gastric catheter is noted in the distal stomach. BOWEL: No free air is seen. BONES: No acute osseous abnormality. IMPRESSION: 1. Gastric catheter in the distal stomach. 2. No free intraperitoneal air. Electronically signed by: Oneil Devonshire MD 11/19/2023 08:41 PM EST RP Workstation: [id number]   CT ABDOMEN PELVIS WO CONTRAST Result Date: 11/19/2023 EXAM: CT ABDOMEN AND PELVIS WITHOUT CONTRAST 11/19/2023 06:08:57 PM TECHNIQUE: CT of the abdomen and pelvis was performed without the administration of intravenous contrast. Multiplanar reformatted images are provided for review. Automated exposure control, iterative reconstruction, and/or weight-based adjustment of the mA/kV was utilized to reduce the radiation dose to as low as reasonably achievable. COMPARISON: CT 07/24/2019 CLINICAL HISTORY: Abdominal pain, acute, nonlocalized. FINDINGS: LOWER CHEST: No acute abnormality. LIVER: Small collection of gas adjacent to the falciform ligament of the liver on image 30/2. Small amount of gas along the capsule of the liver on image 26. GALLBLADDER AND BILE DUCTS: Gallbladder is unremarkable. No biliary ductal dilatation. SPLEEN: No acute abnormality. PANCREAS: No acute abnormality. ADRENAL GLANDS: No acute abnormality. KIDNEYS, URETERS AND BLADDER: No stones in the kidneys or ureters. No hydronephrosis. No perinephric or periureteral stranding. Bladder poorly evaluated due to streak artifact from the bilateral hip prosthetics. GI AND BOWEL: Stomach demonstrates no acute abnormality. There is no bowel obstruction. There is a small collection of gas positioned between the first portion of the duodenum and the left hepatic lobe on image 30  of series 2. This is seen on coronal image 70 of series 8. There are multiple diverticula of the sigmoid colon but no evidence of acute inflammation or perforation. PERITONEUM AND RETROPERITONEUM: There is a small amount of intraperitoneal free air collecting predominantly in the upper abdomen along the ventral peritoneum. For example, small collection of gas adjacent to the falciform ligament of the liver on image 30/2. Small amount of gas along the capsule of the liver on image 26. There is a small collection of gas positioned between the first portion of the duodenum and the left hepatic lobe on image 30 of series 2. This is seen on coronal image 70 of series 8. There is mild inflammatory stranding in the right upper quadrant. A small amount of fluid along the right anterior pararenal fascia. There is trace intraperitoneal free fluid in the pelvis on the right on image 69/2. VASCULATURE: Aorta is normal in caliber. LYMPH NODES:  No lymphadenopathy. REPRODUCTIVE ORGANS: No acute abnormality. BONES AND SOFT TISSUES: No acute osseous abnormality. No focal soft tissue abnormality. Small amount of intraperitoneal free air predominantly in the upper abdomen with mild inflammatory stranding in the right upper quadrant. Findings are consistent with perforation of the hollow viscus. Most likely source would be proximal duodenum related peptic ulcer disease. Secondary source although less likely would be perforated sigmoid diverticulum. Free fluid in the pelvis. Recommend emergent surgical consultation. Findings conveyed to ordering physician Ruthe, MD at time of interpretation. IMPRESSION: 1. Small amount of intraperitoneal free air predominantly in the upper abdomen with mild inflammatory stranding in the right upper quadrant, consistent with hollow viscus perforation. Most likely source is proximal duodenum (peptic ulcer disease). A perforated sigmoid diverticulum is less likely. 2. Trace intraperitoneal free fluid in  the pelvis. 3. Recommend emergent surgical consultation. 4. Findings conveyed to ordering physician Ruthe, MD at time of interpretation. Electronically signed by: Norleen Boxer MD 11/19/2023 07:02 PM EST RP Workstation: [id number]    Medications / Allergies: per chart  Antibiotics: Anti-infectives (From admission, onward)    Start     Dose/Rate Route Frequency Ordered Stop   11/19/23 1930  vancomycin (VANCOCIN) IVPB 1000 mg/200 mL premix       Placed in Followed by Linked Group   1,000 mg 200 mL/hr over 60 Minutes Intravenous  Once 11/19/23 1922     11/19/23 1930  vancomycin (VANCOCIN) 500 mg in sodium chloride  0.9 % 100 mL IVPB       Placed in Followed by Linked Group   500 mg 100 mL/hr over 60 Minutes Intravenous  Once 11/19/23 1922     11/19/23 1930  metroNIDAZOLE  (FLAGYL ) IVPB 500 mg        500 mg 100 mL/hr over 60 Minutes Intravenous  Once 11/19/23 1923 11/19/23 2107   11/19/23 1815  cefTRIAXone (ROCEPHIN) 2 g in sodium chloride  0.9 % 100 mL IVPB        2 g 200 mL/hr over 30 Minutes Intravenous  Once 11/19/23 1807 11/19/23 1929         Note: Portions of this report may have been transcribed using voice recognition software. Every effort was made to ensure accuracy; however, inadvertent computerized transcription errors may be present.   Any transcriptional errors that result from this process are unintentional.      11/19/2023  9:45 PM

## 2023-11-20 DIAGNOSIS — R739 Hyperglycemia, unspecified: Secondary | ICD-10-CM | POA: Diagnosis not present

## 2023-11-20 DIAGNOSIS — N390 Urinary tract infection, site not specified: Secondary | ICD-10-CM | POA: Diagnosis present

## 2023-11-20 DIAGNOSIS — R198 Other specified symptoms and signs involving the digestive system and abdomen: Secondary | ICD-10-CM

## 2023-11-20 DIAGNOSIS — K279 Peptic ulcer, site unspecified, unspecified as acute or chronic, without hemorrhage or perforation: Secondary | ICD-10-CM | POA: Insufficient documentation

## 2023-11-20 DIAGNOSIS — N179 Acute kidney failure, unspecified: Secondary | ICD-10-CM | POA: Diagnosis not present

## 2023-11-20 DIAGNOSIS — K631 Perforation of intestine (nontraumatic): Secondary | ICD-10-CM | POA: Diagnosis not present

## 2023-11-20 LAB — COMPREHENSIVE METABOLIC PANEL WITH GFR
ALT: 46 U/L — ABNORMAL HIGH (ref 0–44)
AST: 25 U/L (ref 15–41)
Albumin: 2.9 g/dL — ABNORMAL LOW (ref 3.5–5.0)
Alkaline Phosphatase: 45 U/L (ref 38–126)
Anion gap: 10 (ref 5–15)
BUN: 64 mg/dL — ABNORMAL HIGH (ref 8–23)
CO2: 20 mmol/L — ABNORMAL LOW (ref 22–32)
Calcium: 8.1 mg/dL — ABNORMAL LOW (ref 8.9–10.3)
Chloride: 110 mmol/L (ref 98–111)
Creatinine, Ser: 2.37 mg/dL — ABNORMAL HIGH (ref 0.61–1.24)
GFR, Estimated: 29 mL/min — ABNORMAL LOW (ref >60)
Glucose, Bld: 125 mg/dL — ABNORMAL HIGH (ref 70–99)
Potassium: 4.9 mmol/L (ref 3.5–5.1)
Sodium: 140 mmol/L (ref 135–145)
Total Bilirubin: 0.3 mg/dL (ref 0.0–1.2)
Total Protein: 5.1 g/dL — ABNORMAL LOW (ref 6.5–8.1)

## 2023-11-20 LAB — HIV ANTIBODY (ROUTINE TESTING W REFLEX): HIV Screen 4th Generation wRfx: NONREACTIVE

## 2023-11-20 LAB — GLUCOSE, CAPILLARY
Glucose-Capillary: 100 mg/dL — ABNORMAL HIGH (ref 70–99)
Glucose-Capillary: 103 mg/dL — ABNORMAL HIGH (ref 70–99)
Glucose-Capillary: 106 mg/dL — ABNORMAL HIGH (ref 70–99)
Glucose-Capillary: 109 mg/dL — ABNORMAL HIGH (ref 70–99)
Glucose-Capillary: 110 mg/dL — ABNORMAL HIGH (ref 70–99)
Glucose-Capillary: 97 mg/dL (ref 70–99)

## 2023-11-20 LAB — LACTIC ACID, PLASMA: Lactic Acid, Venous: 2.3 mmol/L (ref 0.5–1.9)

## 2023-11-20 LAB — HEMOGLOBIN A1C
Hgb A1c MFr Bld: 5.3 % (ref 4.8–5.6)
Mean Plasma Glucose: 105.41 mg/dL

## 2023-11-20 LAB — URINE CULTURE: Culture: <10000 — AB

## 2023-11-20 MED ORDER — HYDROMORPHONE HCL 1 MG/ML IJ SOLN
0.5000 mg | INTRAMUSCULAR | Status: DC | PRN
  Administered 2023-11-20 – 2023-11-22 (×11): 1 mg via INTRAVENOUS
  Administered 2023-11-23: 0.5 mg via INTRAVENOUS
  Administered 2023-11-24: 1 mg via INTRAVENOUS
  Filled 2023-11-20 (×14): qty 1

## 2023-11-20 MED ORDER — SODIUM CHLORIDE 0.9 % IV SOLN
2.0000 g | INTRAVENOUS | Status: DC
  Administered 2023-11-21: 2 g via INTRAVENOUS
  Filled 2023-11-20: qty 12.5

## 2023-11-20 MED ORDER — LABETALOL HCL 5 MG/ML IV SOLN
10.0000 mg | INTRAVENOUS | Status: DC | PRN
  Administered 2023-11-23 – 2023-11-28 (×5): 10 mg via INTRAVENOUS
  Filled 2023-11-20 (×4): qty 4

## 2023-11-20 MED ORDER — SODIUM CHLORIDE 0.9 % IV SOLN: INTRAVENOUS | Status: DC

## 2023-11-20 NOTE — Consult Note (Signed)
 Reason for Consult:abdominal pain Referring Physician: Dr. Davia Fairy Theodore Stevenson is an 68 y.o. male.  HPI: The patient is a 68 year old white male who has been at home for the last 5 days or so with some abdominal pain.  He has had ulcers in the past.  He has been intentionally trying to lose weight so he has not been eating or drinking much.  He denies any vomiting at home.  He ended up going to the emergency department for the abdominal pain and a CT scan was performed that showed a couple of small dots of air that appear to be outside of the bowel wall near the stomach.  His white count is normal.  He is on Pepcid  at home for peptic ulcer disease and reflux.  He quit smoking years ago.  Past Medical History:  Diagnosis Date   Arthritis    Hepatitis    1973   Hypertension    Spinal stenosis     Past Surgical History:  Procedure Laterality Date   BACK SURGERY     1987   CARPAL TUNNEL RELEASE     right and left   HIP SURGERY  06/30/2011   total left hip   JOINT REPLACEMENT     right total hip   TOTAL HIP ARTHROPLASTY  06/30/2011   Procedure: TOTAL HIP ARTHROPLASTY;  Surgeon: Maude LELON Right, MD;  Location: [facility] OR;  Service: Orthopedics;  Laterality: Left;    History reviewed. No pertinent family history.  Social History:  reports that he quit smoking about 17 years ago. His smoking use included cigarettes. He started smoking about 27 years ago. He has a 10 pack-year smoking history. His smokeless tobacco use includes chew. He reports that he does not drink alcohol and does not use drugs.  Allergies:  Allergies  Allergen Reactions   Codeine Nausea And Vomiting   Penicillins     Pt was young unsure exactly      Medications: I have reviewed the patient's current medications.  Results for orders placed or performed during the hospital encounter of 11/19/23 (from the past 48 hours)  Urinalysis, Routine w reflex microscopic -Urine, Clean Catch     Status: Abnormal    Collection Time: 11/19/23  4:16 PM  Result Value Ref Range   Color, Urine YELLOW YELLOW   APPearance CLOUDY (A) CLEAR   Specific Gravity, Urine 1.025 1.005 - 1.030   pH 5.0 5.0 - 8.0   Glucose, UA NEGATIVE NEGATIVE mg/dL   Hgb urine dipstick NEGATIVE NEGATIVE   Bilirubin Urine SMALL (A) NEGATIVE   Ketones, ur 15 (A) NEGATIVE mg/dL   Protein, ur 30 (A) NEGATIVE mg/dL   Nitrite POSITIVE (A) NEGATIVE   Leukocytes,Ua TRACE (A) NEGATIVE    Comment: Performed at [facility], [address] [place], [place] [address]  Urinalysis, Microscopic (reflex)     Status: Abnormal   Collection Time: 11/19/23  4:16 PM  Result Value Ref Range   RBC / HPF 0-5 0 - 5 RBC/hpf   WBC, UA 6-10 0 - 5 WBC/hpf   Bacteria, UA MANY (A) NONE SEEN   Squamous Epithelial / HPF 0-5 0 - 5 /HPF   Non Squamous Epithelial PRESENT (A) NONE SEEN    Comment: Performed at [facility], [address] [place], [place] [address]  Lipase, blood     Status: None   Collection Time: 11/19/23  4:19 PM  Result Value Ref Range  Lipase 32 11 - 51 U/L    Comment: Performed at [facility], [address] [place], [place] [address]  Comprehensive metabolic panel     Status: Abnormal   Collection Time: 11/19/23  4:19 PM  Result Value Ref Range   Sodium 138 135 - 145 mmol/L   Potassium 5.7 (H) 3.5 - 5.1 mmol/L   Chloride 104 98 - 111 mmol/L   CO2 21 (L) 22 - 32 mmol/L   Glucose, Bld 212 (H) 70 - 99 mg/dL    Comment: Glucose reference range applies only to samples taken after fasting for at least 8 hours.   BUN 66 (H) 8 - 23 mg/dL   Creatinine, Ser 7.89 (H) 0.61 - 1.24 mg/dL   Calcium 8.8 (L) 8.9 - 10.3 mg/dL   Total Protein 5.6 (L) 6.5 - 8.1 g/dL   Albumin 3.4 (L) 3.5 - 5.0 g/dL   AST 29 15 - 41 U/L   ALT 58 (H) 0 - 44 U/L   Alkaline Phosphatase 59 38 - 126 U/L   Total Bilirubin 0.3 0.0 - 1.2 mg/dL   GFR, Estimated 33 (L) >60 mL/min    Comment: (NOTE) Calculated using the CKD-EPI  Creatinine Equation (2021)    Anion gap 13 5 - 15    Comment: Performed at [facility], [address] [place], [place] [address]  CBC     Status: Abnormal   Collection Time: 11/19/23  4:19 PM  Result Value Ref Range   WBC 7.9 4.0 - 10.5 K/uL   RBC 3.71 (L) 4.22 - 5.81 MIL/uL   Hemoglobin 12.0 (L) 13.0 - 17.0 g/dL   HCT 63.2 (L) 60.9 - 47.9 %   MCV 98.9 80.0 - 100.0 fL   MCH 32.3 26.0 - 34.0 pg   MCHC 32.7 30.0 - 36.0 g/dL   RDW 86.6 88.4 - 84.4 %   Platelets 214 150 - 400 K/uL   nRBC 0.0 0.0 - 0.2 %    Comment: Performed at [facility], [address] [place], [place] [address]  CBG monitoring, ED     Status: Abnormal   Collection Time: 11/19/23  7:28 PM  Result Value Ref Range   Glucose-Capillary 157 (H) 70 - 99 mg/dL    Comment: Glucose reference range applies only to samples taken after fasting for at least 8 hours.  Lactic acid, plasma     Status: Abnormal   Collection Time: 11/19/23  7:40 PM  Result Value Ref Range   Lactic Acid, Venous 3.1 (HH) 0.5 - 1.9 mmol/L    Comment: Critical Value, Read Back and verified with Louie RN at 2034 on 11/19/23 by [email or web address] Performed at [facility], [address] [place], [place] [address]   Lactic acid, plasma     Status: Abnormal   Collection Time: 11/19/23 10:01 PM  Result Value Ref Range   Lactic Acid, Venous 2.5 (HH) 0.5 - 1.9 mmol/L    Comment: Critical value noted. Value is consistent with previously reported and called value  Performed at [facility], [address] [place], [place] [address]   Glucose, capillary     Status: Abnormal   Collection Time: 11/20/23 12:22 AM  Result Value Ref Range   Glucose-Capillary 110 (H) 70 - 99 mg/dL    Comment: Glucose reference range applies only to samples taken after fasting for at least 8 hours.  Hemoglobin A1c  Status: None   Collection Time: 11/20/23  2:55 AM  Result Value Ref Range   Hgb A1c MFr Bld 5.3 4.8 - 5.6 %    Comment:  (NOTE) Diagnosis of Diabetes The following HbA1c ranges recommended by the American Diabetes Association (ADA) may be used as an aid in the diagnosis of diabetes mellitus.  Hemoglobin             Suggested A1C NGSP%              Diagnosis  <5.7                   Non Diabetic  5.7-6.4                Pre-Diabetic  >6.4                   Diabetic  <7.0                   Glycemic control for                       adults with diabetes.     Mean Plasma Glucose 105.41 mg/dL    Comment: Performed at [facility], [address], [place], [place] [address]  Lactic acid, plasma     Status: Abnormal   Collection Time: 11/20/23  2:55 AM  Result Value Ref Range   Lactic Acid, Venous 2.3 (HH) 0.5 - 1.9 mmol/L    Comment: Critical value noted. Value is consistent with previously reported and called value  Performed at [facility], [address] [place], [place] [address]   Comprehensive metabolic panel     Status: Abnormal   Collection Time: 11/20/23  2:56 AM  Result Value Ref Range   Sodium 140 135 - 145 mmol/L   Potassium 4.9 3.5 - 5.1 mmol/L   Chloride 110 98 - 111 mmol/L   CO2 20 (L) 22 - 32 mmol/L   Glucose, Bld 125 (H) 70 - 99 mg/dL    Comment: Glucose reference range applies only to samples taken after fasting for at least 8 hours.   BUN 64 (H) 8 - 23 mg/dL   Creatinine, Ser 7.62 (H) 0.61 - 1.24 mg/dL   Calcium 8.1 (L) 8.9 - 10.3 mg/dL   Total Protein 5.1 (L) 6.5 - 8.1 g/dL   Albumin 2.9 (L) 3.5 - 5.0 g/dL   AST 25 15 - 41 U/L   ALT 46 (H) 0 - 44 U/L   Alkaline Phosphatase 45 38 - 126 U/L   Total Bilirubin 0.3 0.0 - 1.2 mg/dL   GFR, Estimated 29 (L) >60 mL/min    Comment: (NOTE) Calculated using the CKD-EPI Creatinine Equation (2021)    Anion gap 10 5 - 15    Comment: Performed at [facility], [address], [place], [place] [address]  Glucose, capillary     Status: Abnormal   Collection Time: 11/20/23  3:28 AM  Result  Value Ref Range   Glucose-Capillary 109 (H) 70 - 99 mg/dL    Comment: Glucose reference range applies only to samples taken after fasting for at least 8 hours.  Glucose, capillary     Status: Abnormal   Collection Time: 11/20/23  7:49 AM  Result Value Ref Range   Glucose-Capillary 103 (H) 70 - 99 mg/dL    Comment: Glucose reference range applies only to samples taken after fasting for at  least 8 hours.    DG Abd Portable 1 View Result Date: 11/19/2023 EXAM: 1 VIEW XRAY OF THE ABDOMEN 11/19/2023 08:37:00 PM COMPARISON: None available. CLINICAL HISTORY: NGT placement. FINDINGS: LINES, TUBES AND DEVICES: A gastric catheter is noted in the distal stomach. BOWEL: No free air is seen. BONES: No acute osseous abnormality. IMPRESSION: 1. Gastric catheter in the distal stomach. 2. No free intraperitoneal air. Electronically signed by: Oneil Devonshire MD 11/19/2023 08:41 PM EST RP Workstation: [id number]   CT ABDOMEN PELVIS WO CONTRAST Result Date: 11/19/2023 EXAM: CT ABDOMEN AND PELVIS WITHOUT CONTRAST 11/19/2023 06:08:57 PM TECHNIQUE: CT of the abdomen and pelvis was performed without the administration of intravenous contrast. Multiplanar reformatted images are provided for review. Automated exposure control, iterative reconstruction, and/or weight-based adjustment of the mA/kV was utilized to reduce the radiation dose to as low as reasonably achievable. COMPARISON: CT 07/24/2019 CLINICAL HISTORY: Abdominal pain, acute, nonlocalized. FINDINGS: LOWER CHEST: No acute abnormality. LIVER: Small collection of gas adjacent to the falciform ligament of the liver on image 30/2. Small amount of gas along the capsule of the liver on image 26. GALLBLADDER AND BILE DUCTS: Gallbladder is unremarkable. No biliary ductal dilatation. SPLEEN: No acute abnormality. PANCREAS: No acute abnormality. ADRENAL GLANDS: No acute abnormality. KIDNEYS, URETERS AND BLADDER: No stones in the kidneys or ureters. No hydronephrosis. No  perinephric or periureteral stranding. Bladder poorly evaluated due to streak artifact from the bilateral hip prosthetics. GI AND BOWEL: Stomach demonstrates no acute abnormality. There is no bowel obstruction. There is a small collection of gas positioned between the first portion of the duodenum and the left hepatic lobe on image 30 of series 2. This is seen on coronal image 70 of series 8. There are multiple diverticula of the sigmoid colon but no evidence of acute inflammation or perforation. PERITONEUM AND RETROPERITONEUM: There is a small amount of intraperitoneal free air collecting predominantly in the upper abdomen along the ventral peritoneum. For example, small collection of gas adjacent to the falciform ligament of the liver on image 30/2. Small amount of gas along the capsule of the liver on image 26. There is a small collection of gas positioned between the first portion of the duodenum and the left hepatic lobe on image 30 of series 2. This is seen on coronal image 70 of series 8. There is mild inflammatory stranding in the right upper quadrant. A small amount of fluid along the right anterior pararenal fascia. There is trace intraperitoneal free fluid in the pelvis on the right on image 69/2. VASCULATURE: Aorta is normal in caliber. LYMPH NODES: No lymphadenopathy. REPRODUCTIVE ORGANS: No acute abnormality. BONES AND SOFT TISSUES: No acute osseous abnormality. No focal soft tissue abnormality. Small amount of intraperitoneal free air predominantly in the upper abdomen with mild inflammatory stranding in the right upper quadrant. Findings are consistent with perforation of the hollow viscus. Most likely source would be proximal duodenum related peptic ulcer disease. Secondary source although less likely would be perforated sigmoid diverticulum. Free fluid in the pelvis. Recommend emergent surgical consultation. Findings conveyed to ordering physician Ruthe, MD at time of interpretation. IMPRESSION:  1. Small amount of intraperitoneal free air predominantly in the upper abdomen with mild inflammatory stranding in the right upper quadrant, consistent with hollow viscus perforation. Most likely source is proximal duodenum (peptic ulcer disease). A perforated sigmoid diverticulum is less likely. 2. Trace intraperitoneal free fluid in the pelvis. 3. Recommend emergent surgical consultation. 4. Findings conveyed to ordering physician Ruthe, MD at  time of interpretation. Electronically signed by: Norleen Boxer MD 11/19/2023 07:02 PM EST RP Workstation: [id number]    Review of Systems  Constitutional:  Positive for unexpected weight change.  HENT: Negative.    Eyes: Negative.   Respiratory: Negative.    Cardiovascular: Negative.   Gastrointestinal:  Positive for abdominal distention and abdominal pain. Negative for vomiting.  Endocrine: Negative.   Genitourinary: Negative.   Musculoskeletal: Negative.   Skin: Negative.   Allergic/Immunologic: Negative.   Neurological: Negative.   Hematological: Negative.   Psychiatric/Behavioral: Negative.     Blood pressure 116/65, pulse 100, temperature 99.6 F (37.6 C), temperature source Oral, resp. rate 16, height 5' 5 (1.651 m), weight 83 kg, SpO2 98%. Physical Exam Vitals reviewed.  Constitutional:      General: He is not in acute distress.    Appearance: Normal appearance.  HENT:     Head: Normocephalic and atraumatic.     Right Ear: External ear normal.     Left Ear: External ear normal.     Nose: Nose normal.     Mouth/Throat:     Mouth: Mucous membranes are dry.     Pharynx: Oropharynx is clear.  Eyes:     Extraocular Movements: Extraocular movements intact.     Conjunctiva/sclera: Conjunctivae normal.     Pupils: Pupils are equal, round, and reactive to light.  Cardiovascular:     Rate and Rhythm: Normal rate and regular rhythm.     Pulses: Normal pulses.     Heart sounds: Normal heart sounds.  Pulmonary:     Effort: Pulmonary  effort is normal. No respiratory distress.     Breath sounds: Normal breath sounds.  Abdominal:     General: There is distension.     Palpations: Abdomen is soft.     Comments: The abdomen is distended. There is minimal abdominal pain. No guarding or peritonitis. No bs  Musculoskeletal:        General: No swelling or deformity. Normal range of motion.     Cervical back: Tenderness present.  Skin:    General: Skin is warm and dry.     Coloration: Skin is not jaundiced.  Neurological:     General: No focal deficit present.     Mental Status: He is alert and oriented to person, place, and time.  Psychiatric:        Mood and Affect: Mood normal.        Behavior: Behavior normal.     Assessment/Plan: The patient has been having some abdominal pain which we suspect may be due to a microperforation of a gastric ulcer given his history and CT scan findings.  Fortunately he does not appear to be sick from this.  I would agree at this point in time with NG tube and bowel rest and broad-spectrum antibiotic therapy.  He will need to be on high-dose Protonix.  He will need significant IV hydration since his creatinine is elevated.  We will follow him closely with you.  If he does not improve then we could plan for an exploration.  If he does improve then he may end up being able to go home on Protonix with follow-up to both surgery and gastroenterology.  We will follow him closely with you to monitor his progress  Deward Null III 11/20/2023, 7:54 AM

## 2023-11-20 NOTE — Progress Notes (Addendum)
 [facility]                                                                              Theodore Stevenson, is a 68 y.o. male, DOB - [birth year], FMW:[id number] Admit date - 11/19/2023    Outpatient Primary MD for the patient is Patient, No Pcp Per  LOS - 1  days  Chief Complaint  Patient presents with   Abdominal Pain       Brief summary   Patient is a 68 year old male with HTN, spinal stenosis, chronic back pain presented to ED with severe abdominal pain.  Patient reported few days of bloating and indigestion, then developed severe abdominal pain in the right upper abdomen.  No nausea vomiting or diarrhea, fevers.  He was previously using Aleve and ibuprofen but not recently.  Patient has been taking over-the-counter analgesics called pain away that contains aspirin.  Also reported that he was intentionally trying to lose weight so has not been eating or drinking much. In ED, potassium 5.7, creatinine 2.1, lactic acidosis, CT abdomen concerning for small amount of intraperitoneal free air in the upper abdomen with RUQ inflammation most concerning for proximal duodenal perforation General Surgery was consulted, NG tube placed, IV Protonix, broad-spectrum antibiotics.  Patient was admitted for further workup.   Assessment & Plan       Micro pneumoperitoneum, possible duodenal perforation, PUD -Patient feeling somewhat better after NGT, IV fluids and pain control. - Continue IV cefepime and Flagyl , IV Protonix 40 mg every 12 hours, IV fluids, n.p.o. - Reviewed general surgery recommendations, patient will need GI evaluation, likely outpatient if he is able to discharge.  Patient does not have PCP after his prior PCP became out of network and has never seen GI in the past.  Likely has gastric/duodenal ulcer, never had endoscopy or colonoscopy in the past. Will request GI to see inpatient or arrange outpatient appointment to get established with GI, consult requested with  [facility]. Addendum 12:30 PM Discussed with Dr. Kriss Ee GI, office will call to arrange appointment outpatient after discharge.    AKI (acute kidney injury) with hyperkalemia, lactic acidosis, NAGMA - No hydronephrosis on CT - Creatinine 2.1 on admission, previously 0.9 in 07/2019, may have some underlying CKD - Creatinine worsened to 2.37 today, increase IV fluids - If no significant improvement in next 1 to 4 hours, obtain renal ultrasound and further workup  Hyperkalemia - Improved  Hyperglycemia - Hemoglobin A1c 5.3 CBG (last 3)  Recent Labs    11/20/23 0328 11/20/23 0749 11/20/23 1135  GLUCAP 109* 103* 100*   CBGs controlled    Hypertension -BP stable  UTI UA positive for UTI and ketones, currently on cefepime, follow cultures  Obesity class II  Estimated body mass index is 30.45 kg/m as calculated from the following:   Height as of this encounter: 5' 5 (1.651 m).   Weight as of this encounter: 83 kg.  Code Status: Full code DVT Prophylaxis:  SCDs Start: 11/19/23 2349   Level of Care: Level of care: Telemetry Family Communication: Updated patient's wife at the bedside Disposition Plan:  Remains inpatient appropriate:      Procedures:  None  Consultants:   General Surgery GI  Antimicrobials:   Anti-infectives (From admission, onward)    Start     Dose/Rate Route Frequency Ordered Stop   11/21/23 0300  ceFEPIme (MAXIPIME) 2 g in sodium chloride  0.9 % 100 mL IVPB        2 g 200 mL/hr over 30 Minutes Intravenous Every 24 hours 11/20/23 0525     11/20/23 0800  metroNIDAZOLE  (FLAGYL ) IVPB 500 mg        500 mg 100 mL/hr over 60 Minutes Intravenous Every 12 hours 11/19/23 2351     11/20/23 0100  ceFEPIme (MAXIPIME) 2 g in sodium chloride  0.9 % 100 mL IVPB  Status:  Discontinued        2 g 200 mL/hr over 30 Minutes Intravenous Every 12 hours 11/19/23 2359 11/20/23 0525   11/20/23 0045  vancomycin (VANCOREADY) IVPB 500 mg/100 mL        500  mg 100 mL/hr over 60 Minutes Intravenous  Once 11/19/23 2353 11/20/23 0144   11/19/23 1930  vancomycin (VANCOCIN) IVPB 1000 mg/200 mL premix       Placed in Followed by Linked Group   1,000 mg 200 mL/hr over 60 Minutes Intravenous  Once 11/19/23 1922 11/19/23 2223   11/19/23 1930  vancomycin (VANCOCIN) 500 mg in sodium chloride  0.9 % 100 mL IVPB  Status:  Discontinued       Placed in Followed by Linked Group   500 mg 100 mL/hr over 60 Minutes Intravenous  Once 11/19/23 1922 11/19/23 2353   11/19/23 1930  metroNIDAZOLE  (FLAGYL ) IVPB 500 mg        500 mg 100 mL/hr over 60 Minutes Intravenous  Once 11/19/23 1923 11/19/23 2223   11/19/23 1815  cefTRIAXone (ROCEPHIN) 2 g in sodium chloride  0.9 % 100 mL IVPB        2 g 200 mL/hr over 30 Minutes Intravenous  Once 11/19/23 1807 11/19/23 1929          Medications  insulin aspart  0-6 Units Subcutaneous Q4H   pantoprazole (PROTONIX) IV  40 mg Intravenous Q12H   sodium chloride  flush  3 mL Intravenous Q12H      Subjective:   Theodore Stevenson was seen and examined today.  Low-grade fever 100.5 F, currently no acute abdominal pain.  No ongoing nausea vomiting, diarrhea, chest pain or shortness of breath.   Objective:   Vitals:   11/19/23 2030 11/19/23 2217 11/19/23 2335 11/20/23 0327  BP: (!) 160/92  134/68 116/65  Pulse: 100     Resp: (!) 22  18 16   Temp:  98.4 F (36.9 C) (!) 100.5 F (38.1 C) 99.6 F (37.6 C)  TempSrc:  Oral Oral Oral  SpO2: 99%  92% 98%  Weight:      Height:        Intake/Output Summary (Last 24 hours) at 11/20/2023 1151 Last data filed at 11/20/2023 0800 Gross per 24 hour  Intake 2993.57 ml  Output 800 ml  Net 2193.57 ml     Wt Readings from Last 3 Encounters:  11/19/23 83 kg  08/06/19 84 kg  07/27/19 84.4 kg     Exam General: Alert and oriented x 3, NAD, NGT+ Cardiovascular: S1 S2 auscultated,  RRR Respiratory: Clear to auscultation bilaterally, no wheezing, rales or  rhonchi Gastrointestinal: Soft, minimally tender, + distended, no  guarding Ext: no pedal edema bilaterally Neuro: No new deficits Psych: Normal  affect     Data Reviewed:  I have personally reviewed following labs    CBC Lab Results  Component Value Date   WBC 7.9 11/19/2023   RBC 3.71 (L) 11/19/2023   HGB 12.0 (L) 11/19/2023   HCT 36.7 (L) 11/19/2023   MCV 98.9 11/19/2023   MCH 32.3 11/19/2023   PLT 214 11/19/2023   MCHC 32.7 11/19/2023   RDW 13.3 11/19/2023   LYMPHSABS 1.8 06/24/2011   MONOABS 0.9 06/24/2011   EOSABS 0.1 06/24/2011   BASOSABS 0.1 06/24/2011     Last metabolic panel Lab Results  Component Value Date   NA 140 11/20/2023   K 4.9 11/20/2023   CL 110 11/20/2023   CO2 20 (L) 11/20/2023   BUN 64 (H) 11/20/2023   CREATININE 2.37 (H) 11/20/2023   GLUCOSE 125 (H) 11/20/2023   GFRNONAA 29 (L) 11/20/2023   GFRAA >60 07/27/2019   CALCIUM 8.1 (L) 11/20/2023   PROT 5.1 (L) 11/20/2023   ALBUMIN 2.9 (L) 11/20/2023   BILITOT 0.3 11/20/2023   ALKPHOS 45 11/20/2023   AST 25 11/20/2023   ALT 46 (H) 11/20/2023   ANIONGAP 10 11/20/2023    CBG (last 3)  Recent Labs    11/20/23 0328 11/20/23 0749 11/20/23 1135  GLUCAP 109* 103* 100*      Coagulation Profile: No results for input(s): INR, PROTIME in the last 168 hours.   Radiology Studies: I have personally reviewed the imaging studies  DG Abd Portable 1 View Result Date: 11/19/2023 EXAM: 1 VIEW XRAY OF THE ABDOMEN 11/19/2023 08:37:00 PM COMPARISON: None available. CLINICAL HISTORY: NGT placement. FINDINGS: LINES, TUBES AND DEVICES: A gastric catheter is noted in the distal stomach. BOWEL: No free air is seen. BONES: No acute osseous abnormality. IMPRESSION: 1. Gastric catheter in the distal stomach. 2. No free intraperitoneal air. Electronically signed by: Oneil Devonshire MD 11/19/2023 08:41 PM EST RP Workstation: [id number]   CT ABDOMEN PELVIS WO CONTRAST Result Date: 11/19/2023 EXAM: CT ABDOMEN AND  PELVIS WITHOUT CONTRAST 11/19/2023 06:08:57 PM TECHNIQUE: CT of the abdomen and pelvis was performed without the administration of intravenous contrast. Multiplanar reformatted images are provided for review. Automated exposure control, iterative reconstruction, and/or weight-based adjustment of the mA/kV was utilized to reduce the radiation dose to as low as reasonably achievable. COMPARISON: CT 07/24/2019 CLINICAL HISTORY: Abdominal pain, acute, nonlocalized. FINDINGS: LOWER CHEST: No acute abnormality. LIVER: Small collection of gas adjacent to the falciform ligament of the liver on image 30/2. Small amount of gas along the capsule of the liver on image 26. GALLBLADDER AND BILE DUCTS: Gallbladder is unremarkable. No biliary ductal dilatation. SPLEEN: No acute abnormality. PANCREAS: No acute abnormality. ADRENAL GLANDS: No acute abnormality. KIDNEYS, URETERS AND BLADDER: No stones in the kidneys or ureters. No hydronephrosis. No perinephric or periureteral stranding. Bladder poorly evaluated due to streak artifact from the bilateral hip prosthetics. GI AND BOWEL: Stomach demonstrates no acute abnormality. There is no bowel obstruction. There is a small collection of gas positioned between the first portion of the duodenum and the left hepatic lobe on image 30 of series 2. This is seen on coronal image 70 of series 8. There are multiple diverticula of the sigmoid colon but no evidence of acute inflammation or perforation. PERITONEUM AND RETROPERITONEUM: There is a small amount of intraperitoneal free air collecting predominantly in the upper abdomen along the ventral peritoneum. For example, small collection of gas adjacent to the falciform ligament of the liver on image 30/2. Small amount of  gas along the capsule of the liver on image 26. There is a small collection of gas positioned between the first portion of the duodenum and the left hepatic lobe on image 30 of series 2. This is seen on coronal image 70 of  series 8. There is mild inflammatory stranding in the right upper quadrant. A small amount of fluid along the right anterior pararenal fascia. There is trace intraperitoneal free fluid in the pelvis on the right on image 69/2. VASCULATURE: Aorta is normal in caliber. LYMPH NODES: No lymphadenopathy. REPRODUCTIVE ORGANS: No acute abnormality. BONES AND SOFT TISSUES: No acute osseous abnormality. No focal soft tissue abnormality. Small amount of intraperitoneal free air predominantly in the upper abdomen with mild inflammatory stranding in the right upper quadrant. Findings are consistent with perforation of the hollow viscus. Most likely source would be proximal duodenum related peptic ulcer disease. Secondary source although less likely would be perforated sigmoid diverticulum. Free fluid in the pelvis. Recommend emergent surgical consultation. Findings conveyed to ordering physician Ruthe, MD at time of interpretation. IMPRESSION: 1. Small amount of intraperitoneal free air predominantly in the upper abdomen with mild inflammatory stranding in the right upper quadrant, consistent with hollow viscus perforation. Most likely source is proximal duodenum (peptic ulcer disease). A perforated sigmoid diverticulum is less likely. 2. Trace intraperitoneal free fluid in the pelvis. 3. Recommend emergent surgical consultation. 4. Findings conveyed to ordering physician Ruthe, MD at time of interpretation. Electronically signed by: Norleen Boxer MD 11/19/2023 07:02 PM EST RP Workstation: [id number]       Nydia Distance M.D. [facility] 11/20/2023, 11:51 AM  Available via Epic secure chat 7am-7pm After 7 pm, please refer to night coverage provider listed on amion.

## 2023-11-20 NOTE — Consult Note (Signed)
 WOC Nurse Consult Note: Reason for Consult: multiple wounds  Wound type: 1. Full thickness linear L lateral buttock/hip red dry appearing  2.  B elbows full thickness scattered some red moist some red dry  3.  L knee full thickness ? R/t trauma red moist  Pressure Injury POA: NA not pressure  Measurement: see nursing flowsheet  Wound bed: as above  Drainage (amount, consistency, odor) see nursing flowsheet  Periwound: scattered dry scabbed healing areas to knee and elbows  Dressing procedure/placement/frequency: Cleanse L lateral buttock/hip, B elbow and L knee wounds with vashe, do not rinse and allow to air dry. Apply Xeroform gauze ([place] [id number]) to wound beds daily and secure with silicone foam.  Soak dressing with normal saline if adhered to wound bed for atraumatic removal.   POC discussed with bedside nurse. WOC team will not follow. Re-consult if further needs arise.   Thank you,    Powell Bar MSN, RN-BC, [employer]

## 2023-11-20 NOTE — Progress Notes (Signed)
 Mobility Specialist - Progress Note   11/20/23 1037  Mobility  Activity Ambulated with assistance  Level of Assistance Contact guard assist, steadying assist  Assistive Device Other (Comment) (IV Pole)  Distance Ambulated (ft) 20 ft  Range of Motion/Exercises Active  Activity Response Tolerated well  Mobility Referral Yes  Mobility visit 1 Mobility  Mobility Specialist Start Time (ACUTE ONLY) 1025  Mobility Specialist Stop Time (ACUTE ONLY) 1035  Mobility Specialist Time Calculation (min) (ACUTE ONLY) 10 min   Pt requested assistance to bathroom. A little unsteady. At EOS returned to bed with all needs met. Call bell in reach and wife in room.   Erminio Leos,  Mobility Specialist Can be reached via Secure Chat

## 2023-11-21 ENCOUNTER — Inpatient Hospital Stay (HOSPITAL_COMMUNITY)

## 2023-11-21 ENCOUNTER — Encounter (HOSPITAL_COMMUNITY): Payer: Self-pay | Admitting: Family Medicine

## 2023-11-21 ENCOUNTER — Encounter (HOSPITAL_COMMUNITY)
Admission: EM | Disposition: A | Discharge status: Home / Self Care | Payer: Self-pay | Source: Home / Self Care | Attending: Internal Medicine

## 2023-11-21 DIAGNOSIS — I10 Essential (primary) hypertension: Secondary | ICD-10-CM | POA: Diagnosis not present

## 2023-11-21 DIAGNOSIS — K631 Perforation of intestine (nontraumatic): Secondary | ICD-10-CM | POA: Diagnosis not present

## 2023-11-21 DIAGNOSIS — R739 Hyperglycemia, unspecified: Secondary | ICD-10-CM | POA: Diagnosis not present

## 2023-11-21 DIAGNOSIS — R198 Other specified symptoms and signs involving the digestive system and abdomen: Secondary | ICD-10-CM | POA: Diagnosis not present

## 2023-11-21 DIAGNOSIS — Z87891 Personal history of nicotine dependence: Secondary | ICD-10-CM | POA: Diagnosis not present

## 2023-11-21 DIAGNOSIS — K255 Chronic or unspecified gastric ulcer with perforation: Secondary | ICD-10-CM | POA: Insufficient documentation

## 2023-11-21 DIAGNOSIS — N179 Acute kidney failure, unspecified: Secondary | ICD-10-CM | POA: Diagnosis not present

## 2023-11-21 HISTORY — PX: LAPAROSCOPY: SHX197

## 2023-11-21 LAB — BASIC METABOLIC PANEL WITH GFR
Anion gap: 11 (ref 5–15)
BUN: 67 mg/dL — ABNORMAL HIGH (ref 8–23)
CO2: 17 mmol/L — ABNORMAL LOW (ref 22–32)
Calcium: 7.5 mg/dL — ABNORMAL LOW (ref 8.9–10.3)
Chloride: 112 mmol/L — ABNORMAL HIGH (ref 98–111)
Creatinine, Ser: 2.01 mg/dL — ABNORMAL HIGH (ref 0.61–1.24)
GFR, Estimated: 35 mL/min — ABNORMAL LOW (ref >60)
Glucose, Bld: 87 mg/dL (ref 70–99)
Potassium: 4.3 mmol/L (ref 3.5–5.1)
Sodium: 140 mmol/L (ref 135–145)

## 2023-11-21 LAB — COMPREHENSIVE METABOLIC PANEL WITH GFR
ALT: 51 U/L — ABNORMAL HIGH (ref 0–44)
AST: 40 U/L (ref 15–41)
Albumin: 2.4 g/dL — ABNORMAL LOW (ref 3.5–5.0)
Alkaline Phosphatase: 56 U/L (ref 38–126)
Anion gap: 12 (ref 5–15)
BUN: 73 mg/dL — ABNORMAL HIGH (ref 8–23)
CO2: 15 mmol/L — ABNORMAL LOW (ref 22–32)
Calcium: 8 mg/dL — ABNORMAL LOW (ref 8.9–10.3)
Chloride: 114 mmol/L — ABNORMAL HIGH (ref 98–111)
Creatinine, Ser: 2.67 mg/dL — ABNORMAL HIGH (ref 0.61–1.24)
GFR, Estimated: 25 mL/min — ABNORMAL LOW (ref >60)
Glucose, Bld: 88 mg/dL (ref 70–99)
Potassium: 5.3 mmol/L — ABNORMAL HIGH (ref 3.5–5.1)
Sodium: 141 mmol/L (ref 135–145)
Total Bilirubin: 0.3 mg/dL (ref 0.0–1.2)
Total Protein: 5.1 g/dL — ABNORMAL LOW (ref 6.5–8.1)

## 2023-11-21 LAB — CBC
HCT: 35 % — ABNORMAL LOW (ref 39.0–52.0)
Hemoglobin: 10.6 g/dL — ABNORMAL LOW (ref 13.0–17.0)
MCH: 32.5 pg (ref 26.0–34.0)
MCHC: 30.3 g/dL (ref 30.0–36.0)
MCV: 107.4 fL — ABNORMAL HIGH (ref 80.0–100.0)
Platelets: 221 K/uL (ref 150–400)
RBC: 3.26 MIL/uL — ABNORMAL LOW (ref 4.22–5.81)
RDW: 13.6 % (ref 11.5–15.5)
WBC: 9.1 K/uL (ref 4.0–10.5)
nRBC: 0.2 % (ref 0.0–0.2)

## 2023-11-21 LAB — GLUCOSE, CAPILLARY
Glucose-Capillary: 121 mg/dL — ABNORMAL HIGH (ref 70–99)
Glucose-Capillary: 66 mg/dL — ABNORMAL LOW (ref 70–99)
Glucose-Capillary: 67 mg/dL — ABNORMAL LOW (ref 70–99)
Glucose-Capillary: 69 mg/dL — ABNORMAL LOW (ref 70–99)
Glucose-Capillary: 72 mg/dL (ref 70–99)
Glucose-Capillary: 84 mg/dL (ref 70–99)
Glucose-Capillary: 91 mg/dL (ref 70–99)
Glucose-Capillary: 96 mg/dL (ref 70–99)

## 2023-11-21 LAB — TYPE AND SCREEN
ABO/RH(D): O POS
Antibody Screen: NEGATIVE

## 2023-11-21 LAB — LACTIC ACID, PLASMA: Lactic Acid, Venous: 1.4 mmol/L (ref 0.5–1.9)

## 2023-11-21 SURGERY — LAPAROSCOPY, DIAGNOSTIC
Anesthesia: General | Site: Abdomen

## 2023-11-21 MED ORDER — DROPERIDOL 2.5 MG/ML IJ SOLN: 0.6250 mg | Freq: Once | INTRAMUSCULAR | Status: DC | PRN

## 2023-11-21 MED ORDER — FENTANYL CITRATE (PF) 50 MCG/ML IJ SOSY
PREFILLED_SYRINGE | INTRAMUSCULAR | Status: AC
  Filled 2023-11-21: qty 2

## 2023-11-21 MED ORDER — LACTATED RINGERS IV SOLN: INTRAVENOUS | Status: DC | PRN

## 2023-11-21 MED ORDER — SODIUM CHLORIDE 0.9 % IR SOLN
Status: DC | PRN
Start: 2023-11-21 — End: 2023-11-21
  Administered 2023-11-21: 3000 mL

## 2023-11-21 MED ORDER — OXYCODONE HCL 5 MG/5ML PO SOLN: 5.0000 mg | Freq: Once | ORAL | Status: DC | PRN

## 2023-11-21 MED ORDER — BUPIVACAINE-EPINEPHRINE (PF) 0.25% -1:200000 IJ SOLN
INTRAMUSCULAR | Status: DC | PRN
  Administered 2023-11-21: 30 mL via PERINEURAL

## 2023-11-21 MED ORDER — OXYCODONE HCL 5 MG PO TABS: 5.0000 mg | ORAL_TABLET | Freq: Once | ORAL | Status: DC | PRN

## 2023-11-21 MED ORDER — LIDOCAINE HCL (PF) 2 % IJ SOLN
INTRAMUSCULAR | Status: AC
  Filled 2023-11-21: qty 5

## 2023-11-21 MED ORDER — FENTANYL CITRATE (PF) 50 MCG/ML IJ SOSY
25.0000 ug | PREFILLED_SYRINGE | INTRAMUSCULAR | Status: DC | PRN
  Administered 2023-11-21 (×2): 50 ug via INTRAVENOUS
  Filled 2023-11-21: qty 1

## 2023-11-21 MED ORDER — EPHEDRINE SULFATE-NACL 50-0.9 MG/10ML-% IV SOSY
PREFILLED_SYRINGE | INTRAVENOUS | Status: DC | PRN
  Administered 2023-11-21: 10 mg via INTRAVENOUS

## 2023-11-21 MED ORDER — SODIUM CHLORIDE 0.9 % IV BOLUS: 1000.0000 mL | Freq: Three times a day (TID) | INTRAVENOUS | Status: AC | PRN

## 2023-11-21 MED ORDER — ACETAMINOPHEN 10 MG/ML IV SOLN: 1000.0000 mg | Freq: Once | INTRAVENOUS | Status: DC | PRN

## 2023-11-21 MED ORDER — ALBUMIN HUMAN 5 % IV SOLN: INTRAVENOUS | Status: DC | PRN

## 2023-11-21 MED ORDER — ALBUMIN HUMAN 5 % IV SOLN
INTRAVENOUS | Status: AC
  Filled 2023-11-21: qty 250

## 2023-11-21 MED ORDER — DEXTROSE 50 % IV SOLN
12.5000 g | INTRAVENOUS | Status: AC
  Administered 2023-11-21: 12.5 g via INTRAVENOUS
  Filled 2023-11-21: qty 50

## 2023-11-21 MED ORDER — PROPOFOL 10 MG/ML IV BOLUS
INTRAVENOUS | Status: AC
  Filled 2023-11-21: qty 20

## 2023-11-21 MED ORDER — FENTANYL CITRATE (PF) 100 MCG/2ML IJ SOLN
INTRAMUSCULAR | Status: AC
  Filled 2023-11-21: qty 2

## 2023-11-21 MED ORDER — SODIUM CHLORIDE 0.9 % IV SOLN
2.0000 g | INTRAVENOUS | Status: DC
  Administered 2023-11-22 – 2023-11-23 (×2): 2 g via INTRAVENOUS
  Filled 2023-11-21 (×2): qty 12.5

## 2023-11-21 MED ORDER — ROCURONIUM BROMIDE 100 MG/10ML IV SOLN
INTRAVENOUS | Status: DC | PRN
  Administered 2023-11-21: 50 mg via INTRAVENOUS

## 2023-11-21 MED ORDER — SUGAMMADEX SODIUM 200 MG/2ML IV SOLN
INTRAVENOUS | Status: DC | PRN
  Administered 2023-11-21: 200 mg via INTRAVENOUS

## 2023-11-21 MED ORDER — BUPIVACAINE-EPINEPHRINE (PF) 0.25% -1:200000 IJ SOLN
INTRAMUSCULAR | Status: AC
  Filled 2023-11-21: qty 30

## 2023-11-21 MED ORDER — SUCCINYLCHOLINE CHLORIDE 200 MG/10ML IV SOSY
PREFILLED_SYRINGE | INTRAVENOUS | Status: DC | PRN
  Administered 2023-11-21: 100 mg via INTRAVENOUS

## 2023-11-21 MED ORDER — PHENYLEPHRINE 80 MCG/ML (10ML) SYRINGE FOR IV PUSH (FOR BLOOD PRESSURE SUPPORT)
PREFILLED_SYRINGE | INTRAVENOUS | Status: DC | PRN
  Administered 2023-11-21: 240 ug via INTRAVENOUS
  Administered 2023-11-21: 160 ug via INTRAVENOUS

## 2023-11-21 MED ORDER — DEXTROSE-SODIUM CHLORIDE 5-0.9 % IV SOLN: INTRAVENOUS | Status: AC

## 2023-11-21 MED ORDER — ACETAMINOPHEN 10 MG/ML IV SOLN
INTRAVENOUS | Status: DC | PRN
  Administered 2023-11-21: 1000 mg via INTRAVENOUS

## 2023-11-21 MED ORDER — ROCURONIUM BROMIDE 10 MG/ML (PF) SYRINGE
PREFILLED_SYRINGE | INTRAVENOUS | Status: AC
  Filled 2023-11-21: qty 10

## 2023-11-21 MED ORDER — METRONIDAZOLE 500 MG/100ML IV SOLN
500.0000 mg | Freq: Two times a day (BID) | INTRAVENOUS | Status: AC
  Administered 2023-11-21 – 2023-11-26 (×10): 500 mg via INTRAVENOUS
  Filled 2023-11-21 (×10): qty 100

## 2023-11-21 MED ORDER — SODIUM BICARBONATE 8.4 % IV SOLN
INTRAVENOUS | Status: AC
  Filled 2023-11-21: qty 1000
  Filled 2023-11-21 (×5): qty 150

## 2023-11-21 MED ORDER — FENTANYL CITRATE (PF) 100 MCG/2ML IJ SOLN
INTRAMUSCULAR | Status: DC | PRN
  Administered 2023-11-21: 100 ug via INTRAVENOUS

## 2023-11-21 MED ORDER — ONDANSETRON HCL 4 MG/2ML IJ SOLN
INTRAMUSCULAR | Status: AC
  Filled 2023-11-21: qty 2

## 2023-11-21 MED ORDER — ACETAMINOPHEN 10 MG/ML IV SOLN
INTRAVENOUS | Status: AC
  Filled 2023-11-21: qty 100

## 2023-11-21 MED ORDER — SODIUM ZIRCONIUM CYCLOSILICATE 10 G PO PACK
10.0000 g | PACK | Freq: Once | ORAL | Status: AC
  Administered 2023-11-21: 10 g
  Filled 2023-11-21: qty 1

## 2023-11-21 MED ORDER — LIDOCAINE HCL (CARDIAC) PF 100 MG/5ML IV SOSY
PREFILLED_SYRINGE | INTRAVENOUS | Status: DC | PRN
  Administered 2023-11-21: 100 mg via INTRAVENOUS

## 2023-11-21 MED ORDER — PHENYLEPHRINE HCL-NACL 20-0.9 MG/250ML-% IV SOLN
INTRAVENOUS | Status: DC | PRN
  Administered 2023-11-21: 75 ug/min via INTRAVENOUS

## 2023-11-21 MED ORDER — SUGAMMADEX SODIUM 200 MG/2ML IV SOLN
INTRAVENOUS | Status: AC
  Filled 2023-11-21: qty 2

## 2023-11-21 MED ORDER — PROPOFOL 10 MG/ML IV BOLUS
INTRAVENOUS | Status: DC | PRN
  Administered 2023-11-21: 150 mg via INTRAVENOUS

## 2023-11-21 MED ORDER — STERILE WATER FOR INJECTION IV SOLN
INTRAVENOUS | Status: DC
  Filled 2023-11-21: qty 1000
  Filled 2023-11-21: qty 150

## 2023-11-21 MED ORDER — LACTATED RINGERS IV BOLUS: 1000.0000 mL | Freq: Three times a day (TID) | INTRAVENOUS | Status: AC | PRN

## 2023-11-21 SURGICAL SUPPLY — 50 items
APPLICATOR COTTON TIP 6 STRL (MISCELLANEOUS) ×2 IMPLANT
BAG COUNTER SPONGE SURGICOUNT (BAG) IMPLANT
BLADE EXTENDED COATED 6.5IN (ELECTRODE) IMPLANT
BLADE HEX COATED 2.75 (ELECTRODE) ×2 IMPLANT
CHLORAPREP W/TINT 26 (MISCELLANEOUS) ×2 IMPLANT
COVER MAYO STAND STRL (DRAPES) IMPLANT
COVER SURGICAL LIGHT HANDLE (MISCELLANEOUS) ×2 IMPLANT
DRAIN CHANNEL 19F RND (DRAIN) IMPLANT
DRAPE LAPAROSCOPIC ABDOMINAL (DRAPES) ×2 IMPLANT
DRAPE WARM FLUID 44X44 (DRAPES) IMPLANT
DRSG TEGADERM 2-3/8X2-3/4 SM (GAUZE/BANDAGES/DRESSINGS) IMPLANT
DRSG TEGADERM 4X4.75 (GAUZE/BANDAGES/DRESSINGS) IMPLANT
ELECT REM PT RETURN 15FT ADLT (MISCELLANEOUS) ×2 IMPLANT
EVACUATOR SILICONE 100CC (DRAIN) IMPLANT
GAUZE SPONGE 2X2 8PLY STRL LF (GAUZE/BANDAGES/DRESSINGS) IMPLANT
GAUZE SPONGE 4X4 12PLY STRL (GAUZE/BANDAGES/DRESSINGS) ×2 IMPLANT
GLOVE BIO SURGEON STRL SZ7.5 (GLOVE) ×4 IMPLANT
GLOVE BIOGEL PI IND STRL 7.0 (GLOVE) ×2 IMPLANT
GOWN STRL REUS W/ TWL LRG LVL3 (GOWN DISPOSABLE) ×2 IMPLANT
GOWN STRL REUS W/ TWL XL LVL3 (GOWN DISPOSABLE) ×2 IMPLANT
HANDLE SUCTION POOLE (INSTRUMENTS) IMPLANT
IRRIGATION SUCT STRKRFLW 2 WTP (MISCELLANEOUS) IMPLANT
KIT BASIN OR (CUSTOM PROCEDURE TRAY) ×2 IMPLANT
KIT TURNOVER KIT A (KITS) ×2 IMPLANT
LIGASURE IMPACT 36 18CM CVD LR (INSTRUMENTS) IMPLANT
NDL INSUFFLATION 14GA 120MM (NEEDLE) IMPLANT
NEEDLE INSUFFLATION 14GA 120MM (NEEDLE) ×2 IMPLANT
NS IRRIG 1000ML POUR BTL (IV SOLUTION) ×2 IMPLANT
PACK GENERAL/GYN (CUSTOM PROCEDURE TRAY) ×2 IMPLANT
RELOAD STAPLE 75 3.8 BLU REG (ENDOMECHANICALS) IMPLANT
SCISSORS LAP 5X35 DISP (ENDOMECHANICALS) IMPLANT
SHEARS HARMONIC 36 ACE (MISCELLANEOUS) IMPLANT
SLEEVE ADV FIXATION 5X100MM (TROCAR) IMPLANT
SPONGE T-LAP 18X18 ~~LOC~~+RFID (SPONGE) IMPLANT
STAPLER PROXIMATE 75MM BLUE (STAPLE) ×2 IMPLANT
STAPLER SKIN PROX 35W (STAPLE) ×2 IMPLANT
SUT PROLENE 2 0 SH DA (SUTURE) IMPLANT
SUT SILK 2 0 SH CR/8 (SUTURE) IMPLANT
SUT SILK 2-0 18XBRD TIE 12 (SUTURE) IMPLANT
SUT SILK 3 0 SH CR/8 (SUTURE) IMPLANT
SUT SILK 3-0 18XBRD TIE 12 (SUTURE) IMPLANT
SUT VLOC 180 2-0 6IN GS21 (SUTURE) IMPLANT
TOWEL OR DSP ST BLU DLX 10/PK (DISPOSABLE) ×4 IMPLANT
TRAY FOLEY MTR SLVR 16FR STAT (SET/KITS/TRAYS/PACK) IMPLANT
TROCAR ADV FIXATION 12X100MM (TROCAR) IMPLANT
TROCAR ADV FIXATION 5X100MM (TROCAR) IMPLANT
TROCAR BALLN 12MMX100 BLUNT (TROCAR) IMPLANT
TUBING INSUFFLATION (TUBING) IMPLANT
WATER STERILE IRR 1000ML POUR (IV SOLUTION) ×2 IMPLANT
YANKAUER SUCT BULB TIP NO VENT (SUCTIONS) IMPLANT

## 2023-11-21 NOTE — Anesthesia Preprocedure Evaluation (Addendum)
 Anesthesia Evaluation  Patient identified by MRN, date of birth, ID band Patient awake    Reviewed: Allergy & Precautions, NPO status , Patient's Chart, lab work & pertinent test results, reviewed documented beta blocker date and time   History of Anesthesia Complications Negative for: history of anesthetic complications  Airway Mallampati: II  TM Distance: >3 FB Neck ROM: Full    Dental no notable dental hx. (+) Poor Dentition   Pulmonary neg COPD, former smoker   Pulmonary exam normal breath sounds clear to auscultation       Cardiovascular hypertension, (-) angina (-) Past MI and (-) DVT Normal cardiovascular exam Rhythm:Regular Rate:Normal     Neuro/Psych neg Seizures Cervical stenosis with radiculopathy     GI/Hepatic PUD,,,(+) Hepatitis -perforated gastric ulcer   Endo/Other    Renal/GU Renal disease     Musculoskeletal  (+) Arthritis ,    Abdominal   Peds  Hematology  (+) Blood dyscrasia, anemia   Anesthesia Other Findings   Reproductive/Obstetrics                              Anesthesia Physical Anesthesia Plan  ASA: 3  Anesthesia Plan: General   Post-op Pain Management:    Induction: Intravenous  PONV Risk Score and Plan: 2 and Ondansetron , Dexamethasone and Treatment may vary due to age or medical condition  Airway Management Planned: Oral ETT and Video Laryngoscope Planned  Additional Equipment: None  Intra-op Plan:   Post-operative Plan: Extubation in OR  Informed Consent: I have reviewed the patients History and Physical, chart, labs and discussed the procedure including the risks, benefits and alternatives for the proposed anesthesia with the patient or authorized representative who has indicated his/her understanding and acceptance.     Dental advisory given  Plan Discussed with: CRNA  Anesthesia Plan Comments:         Anesthesia Quick  Evaluation

## 2023-11-21 NOTE — Progress Notes (Signed)
 [facility]                                                                              Rayvion Stumph, is a 68 y.o. male, DOB - October 01, 1955, FMW:[id number] Admit date - 11/19/2023    Outpatient Primary MD for the patient is Patient, No Pcp Per  LOS - 2  days  Chief Complaint  Patient presents with   Abdominal Pain       Brief summary   Patient is a 68 year old male with HTN, spinal stenosis, chronic back pain presented to ED with severe abdominal pain.  Patient reported few days of bloating and indigestion, then developed severe abdominal pain in the right upper abdomen.  No nausea vomiting or diarrhea, fevers.  He was previously using Aleve and ibuprofen but not recently.  Patient has been taking over-the-counter analgesics called pain away that contains aspirin.  Also reported that he was intentionally trying to lose weight so has not been eating or drinking much. In ED, potassium 5.7, creatinine 2.1, lactic acidosis, CT abdomen concerning for small amount of intraperitoneal free air in the upper abdomen with RUQ inflammation most concerning for proximal duodenal perforation General Surgery was consulted, NG tube placed, IV Protonix, broad-spectrum antibiotics.  Patient was admitted for further workup.   Assessment & Plan       Micro pneumoperitoneum, possible duodenal perforation, PUD -Initially placed on conservative management with IV cefepime, Flagyl , IV Protonix, IV fluids, n.p.o. -General Surgery following, patient appears more distended, no acute tenderness or pain however becoming more acidotic, renal function worsening -N.p.o., plan for exploratory laparotomy today - Discussed with Dr. Kriss Ee GI, on 11/8, office will call to arrange appointment outpatient after discharge.  No prior GI workup endoscopy or colonoscopy before.    AKI (acute kidney injury) with hyperkalemia, lactic acidosis, NAGMA - No hydronephrosis on CT - Creatinine worsening  to 2.67 today, with hyperkalemia, worsening acidosis  - Likely due to #1, changed IV fluids to bicarb drip plan 4 OR today  Hyperkalemia - Lokelma x 1  Hyperglycemia on admission - Hemoglobin A1c 5.3 CBG (last 3)  Recent Labs    11/21/23 0428 11/21/23 0736 11/21/23 0913  GLUCAP 84 66* 96   N.p.o.     Hypertension -BP stable  UTI ruled out Urine culture showed less than 10,000 colonies of insignificant growth.    Obesity class II Estimated body mass index is 30.45 kg/m as calculated from the following:   Height as of this encounter: 5' 5 (1.651 m).   Weight as of this encounter: 83 kg.  Code Status: Full code DVT Prophylaxis:  SCDs Start: 11/19/23 2349   Level of Care: Level of care: Telemetry Family Communication: Updated patient's wife at the bedside today Disposition Plan:      Remains inpatient appropriate:      Procedures:  None  Consultants:   General Surgery GI  Antimicrobials:   Anti-infectives (From admission, onward)    Start     Dose/Rate Route Frequency Ordered Stop   11/21/23 0300  [MAR Hold]  ceFEPIme (MAXIPIME) 2 g in sodium chloride  0.9 % 100 mL IVPB        (  MAR Hold since Sun 11/21/2023 at 1019.Hold Reason: Transfer to a Procedural area)   2 g 200 mL/hr over 30 Minutes Intravenous Every 24 hours 11/20/23 0525     11/20/23 0800  [MAR Hold]  metroNIDAZOLE  (FLAGYL ) IVPB 500 mg        (MAR Hold since Sun 11/21/2023 at 1019.Hold Reason: Transfer to a Procedural area)   500 mg 100 mL/hr over 60 Minutes Intravenous Every 12 hours 11/19/23 2351     11/20/23 0100  ceFEPIme (MAXIPIME) 2 g in sodium chloride  0.9 % 100 mL IVPB  Status:  Discontinued        2 g 200 mL/hr over 30 Minutes Intravenous Every 12 hours 11/19/23 2359 11/20/23 0525   11/20/23 0045  vancomycin (VANCOREADY) IVPB 500 mg/100 mL        500 mg 100 mL/hr over 60 Minutes Intravenous  Once 11/19/23 2353 11/20/23 0144   11/19/23 1930  vancomycin (VANCOCIN) IVPB 1000 mg/200 mL premix        Placed in Followed by Linked Group   1,000 mg 200 mL/hr over 60 Minutes Intravenous  Once 11/19/23 1922 11/19/23 2223   11/19/23 1930  vancomycin (VANCOCIN) 500 mg in sodium chloride  0.9 % 100 mL IVPB  Status:  Discontinued       Placed in Followed by Linked Group   500 mg 100 mL/hr over 60 Minutes Intravenous  Once 11/19/23 1922 11/19/23 2353   11/19/23 1930  metroNIDAZOLE  (FLAGYL ) IVPB 500 mg        500 mg 100 mL/hr over 60 Minutes Intravenous  Once 11/19/23 1923 11/19/23 2223   11/19/23 1815  cefTRIAXone (ROCEPHIN) 2 g in sodium chloride  0.9 % 100 mL IVPB        2 g 200 mL/hr over 30 Minutes Intravenous  Once 11/19/23 1807 11/19/23 1929          Medications  [MAR Hold] insulin aspart  0-6 Units Subcutaneous Q4H   [MAR Hold] pantoprazole (PROTONIX) IV  40 mg Intravenous Q12H   [MAR Hold] sodium chloride  flush  3 mL Intravenous Q12H      Subjective:   Leary Mcnulty was seen and examined today.  Seen earlier this morning, no acute complaints of abdominal pain however abdomen more distended, renal function worsening with acidosis.  Wife at the bedside.  No fever chills, vomiting.  NGT+  Objective:   Vitals:   11/20/23 0327 11/20/23 1343 11/20/23 2011 11/21/23 0432  BP: 116/65 (!) 117/54 (!) 103/48 (!) 130/49  Pulse:  92 88 91  Resp: 16 18 14 20   Temp: 99.6 F (37.6 C) 98.6 F (37 C) 98.6 F (37 C) 98.6 F (37 C)  TempSrc: Oral Oral Oral Oral  SpO2: 98% 94% 93% 100%  Weight:      Height:        Intake/Output Summary (Last 24 hours) at 11/21/2023 1032 Last data filed at 11/21/2023 0655 Gross per 24 hour  Intake 2889.99 ml  Output 100 ml  Net 2789.99 ml     Wt Readings from Last 3 Encounters:  11/19/23 83 kg  08/06/19 84 kg  07/27/19 84.4 kg   Physical Exam General: Alert and oriented, NAD, NGT+ Cardiovascular: S1 S2 clear, RRR.  Respiratory: CTAB, no wheezing, rales or rhonchi Gastrointestinal: Soft, distended, Ext: no pedal edema  bilaterally Neuro: no new deficits Psych: Normal affect    Data Reviewed:  I have personally reviewed following labs    CBC Lab Results  Component Value Date  WBC 9.1 11/21/2023   RBC 3.26 (L) 11/21/2023   HGB 10.6 (L) 11/21/2023   HCT 35.0 (L) 11/21/2023   MCV 107.4 (H) 11/21/2023   MCH 32.5 11/21/2023   PLT 221 11/21/2023   MCHC 30.3 11/21/2023   RDW 13.6 11/21/2023   LYMPHSABS 1.8 06/24/2011   MONOABS 0.9 06/24/2011   EOSABS 0.1 06/24/2011   BASOSABS 0.1 06/24/2011     Last metabolic panel Lab Results  Component Value Date   NA 141 11/21/2023   K 5.3 (H) 11/21/2023   CL 114 (H) 11/21/2023   CO2 15 (L) 11/21/2023   BUN 73 (H) 11/21/2023   CREATININE 2.67 (H) 11/21/2023   GLUCOSE 88 11/21/2023   GFRNONAA 25 (L) 11/21/2023   GFRAA >60 07/27/2019   CALCIUM 8.0 (L) 11/21/2023   PROT 5.1 (L) 11/21/2023   ALBUMIN 2.4 (L) 11/21/2023   BILITOT 0.3 11/21/2023   ALKPHOS 56 11/21/2023   AST 40 11/21/2023   ALT 51 (H) 11/21/2023   ANIONGAP 12 11/21/2023    CBG (last 3)  Recent Labs    11/21/23 0428 11/21/23 0736 11/21/23 0913  GLUCAP 84 66* 96      Coagulation Profile: No results for input(s): INR, PROTIME in the last 168 hours.   Radiology Studies: I have personally reviewed the imaging studies  DG Abd Portable 1 View Result Date: 11/19/2023 EXAM: 1 VIEW XRAY OF THE ABDOMEN 11/19/2023 08:37:00 PM COMPARISON: None available. CLINICAL HISTORY: NGT placement. FINDINGS: LINES, TUBES AND DEVICES: A gastric catheter is noted in the distal stomach. BOWEL: No free air is seen. BONES: No acute osseous abnormality. IMPRESSION: 1. Gastric catheter in the distal stomach. 2. No free intraperitoneal air. Electronically signed by: Oneil Devonshire MD 11/19/2023 08:41 PM EST RP Workstation: [id number]   CT ABDOMEN PELVIS WO CONTRAST Result Date: 11/19/2023 EXAM: CT ABDOMEN AND PELVIS WITHOUT CONTRAST 11/19/2023 06:08:57 PM TECHNIQUE: CT of the abdomen and pelvis was  performed without the administration of intravenous contrast. Multiplanar reformatted images are provided for review. Automated exposure control, iterative reconstruction, and/or weight-based adjustment of the mA/kV was utilized to reduce the radiation dose to as low as reasonably achievable. COMPARISON: CT 07/24/2019 CLINICAL HISTORY: Abdominal pain, acute, nonlocalized. FINDINGS: LOWER CHEST: No acute abnormality. LIVER: Small collection of gas adjacent to the falciform ligament of the liver on image 30/2. Small amount of gas along the capsule of the liver on image 26. GALLBLADDER AND BILE DUCTS: Gallbladder is unremarkable. No biliary ductal dilatation. SPLEEN: No acute abnormality. PANCREAS: No acute abnormality. ADRENAL GLANDS: No acute abnormality. KIDNEYS, URETERS AND BLADDER: No stones in the kidneys or ureters. No hydronephrosis. No perinephric or periureteral stranding. Bladder poorly evaluated due to streak artifact from the bilateral hip prosthetics. GI AND BOWEL: Stomach demonstrates no acute abnormality. There is no bowel obstruction. There is a small collection of gas positioned between the first portion of the duodenum and the left hepatic lobe on image 30 of series 2. This is seen on coronal image 70 of series 8. There are multiple diverticula of the sigmoid colon but no evidence of acute inflammation or perforation. PERITONEUM AND RETROPERITONEUM: There is a small amount of intraperitoneal free air collecting predominantly in the upper abdomen along the ventral peritoneum. For example, small collection of gas adjacent to the falciform ligament of the liver on image 30/2. Small amount of gas along the capsule of the liver on image 26. There is a small collection of gas positioned between the first portion of the  duodenum and the left hepatic lobe on image 30 of series 2. This is seen on coronal image 70 of series 8. There is mild inflammatory stranding in the right upper quadrant. A small amount of  fluid along the right anterior pararenal fascia. There is trace intraperitoneal free fluid in the pelvis on the right on image 69/2. VASCULATURE: Aorta is normal in caliber. LYMPH NODES: No lymphadenopathy. REPRODUCTIVE ORGANS: No acute abnormality. BONES AND SOFT TISSUES: No acute osseous abnormality. No focal soft tissue abnormality. Small amount of intraperitoneal free air predominantly in the upper abdomen with mild inflammatory stranding in the right upper quadrant. Findings are consistent with perforation of the hollow viscus. Most likely source would be proximal duodenum related peptic ulcer disease. Secondary source although less likely would be perforated sigmoid diverticulum. Free fluid in the pelvis. Recommend emergent surgical consultation. Findings conveyed to ordering physician Ruthe, MD at time of interpretation. IMPRESSION: 1. Small amount of intraperitoneal free air predominantly in the upper abdomen with mild inflammatory stranding in the right upper quadrant, consistent with hollow viscus perforation. Most likely source is proximal duodenum (peptic ulcer disease). A perforated sigmoid diverticulum is less likely. 2. Trace intraperitoneal free fluid in the pelvis. 3. Recommend emergent surgical consultation. 4. Findings conveyed to ordering physician Ruthe, MD at time of interpretation. Electronically signed by: Norleen Boxer MD 11/19/2023 07:02 PM EST RP Workstation: [id number]       Nydia Distance M.D. [facility] 11/21/2023, 10:32 AM  Available via Epic secure chat 7am-7pm After 7 pm, please refer to night coverage provider listed on amion.

## 2023-11-21 NOTE — Discharge Instructions (Signed)
 ################################################################  LAPAROSCOPIC SURGERY: POST OP INSTRUCTIONS  ######################################################################  EAT Gradually transition to a high fiber diet with a fiber supplement over the next few weeks after discharge.  Start with a pureed / full liquid diet (see below)  WALK Walk an hour a day.  Control your pain to do that.    CONTROL PAIN Control pain so that you can walk, sleep, tolerate sneezing/coughing, go up/down stairs.  HAVE A BOWEL MOVEMENT DAILY Keep your bowels regular to avoid problems.  OK to try a laxative to override constipation.  OK to use an antidairrheal to slow down diarrhea.  Call if not better after 2 tries  CALL IF YOU HAVE PROBLEMS/CONCERNS Call if you are still struggling despite following these instructions. Call if you have concerns not answered by these instructions  ######################################################################    DIET: Follow a light bland diet & liquids the first 24 hours after arrival home, such as soup, liquids, starches, etc.  Be sure to drink plenty of fluids.  Quickly advance to a usual solid diet within a few days.  Avoid fast food or heavy meals as your are more likely to get nauseated or have irregular bowels.  A low-fat, high-fiber diet for the rest of your life is ideal.  Take your usually prescribed home medications unless otherwise directed. Blood thinners:  You can restart any strong blood thinners after the second postoperative day  for example: COUMADIN (warfarin), XERELTO (rivaroxaban), ELIQUIS (apixaban), PLAVIX (clopidigrel), BRILINTA (ticagrelor), EFFIENT (prasugrel), PRADAXA (dabigatran), etc  Continue aspirin before & after surgery..     Some oozing/bleeding the first 1-2 weeks is common but should taper down & be small volume.    If you are passing many large clots or having uncontrolling bleeding, call your surgeon  PAIN  CONTROL: Pain is best controlled by a usual combination of three different methods TOGETHER: Ice/Heat Over the counter pain medication Prescription pain medication Most patients will experience some swelling and bruising around the incisions.  Ice packs or heating pads (30-60 minutes up to 6 times a day) will help. Use ice for the first few days to help decrease swelling and bruising, then switch to heat to help relax tight/sore spots and speed recovery.  Some people prefer to use ice alone, heat alone, alternating between ice & heat.  Experiment to what works for you.  Swelling and bruising can take several weeks to resolve.   It is helpful to take an over-the-counter pain medication regularly for the first few weeks.  Choose one of the following that works best for you: Naproxen (Aleve, etc)  Two 220mg  tabs twice a day Ibuprofen (Advil, etc) Three 200mg  tabs four times a day (every meal & bedtime) Acetaminophen  (Tylenol , etc) 500-650mg  four times a day (every meal & bedtime) A  prescription for pain medication (such as oxycodone , hydrocodone, tramadol, gabapentin , methocarbamol , etc) should be given to you upon discharge.  Take your pain medication as prescribed.  If you are having problems/concerns with the prescription medicine (does not control pain, nausea, vomiting, rash, itching, etc), please call us  [phone] to see if we need to switch you to a different pain medicine that will work better for you and/or control your side effect better. If you need a refill on your pain medication, please give us  48 hour notice.  contact your pharmacy.  They will contact our office to request authorization. Prescriptions will not be filled after 5 pm or on week-ends  AVOID GETTING CONSTIPATED.   a.  Between the surgery and the pain medications, it is common to experience some constipation.  b.  Drink plenty of liquids c   ake a fiber supplement 2 times day (such as Metamucil, Citrucel, FiberCon,  MiraLax , etc) to have a bowel movement every day. d.  If you have not had a BM by 2 days after surgery: -drink liquids only until you have a bowel movement - take MiraLAX  2 doses every 2 hours until you have a bowel movement   Watch out for diarrhea.   If you have many loose bowel movements, simplify your diet to bland foods & liquids for a few days.   Stop any stool softeners and decrease your fiber supplement.   Switching to mild anti-diarrheal medications (Kayopectate, Pepto Bismol) can help.   If this worsens or does not improve, please call us .  Wash / shower every day.  You may shower over the dressings as they are waterproof.  Continue to shower over incision(s) after the dressing is off.  It is good for closed incisions and even open wounds to be washed every day.  Shower every day.  Short baths are fine.  Wash the incisions and wounds clean with soap & water.    You may leave closed incisions open to air if it is dry.   You may cover the incision with clean gauze & replace it after your daily shower for comfort.  DRAIN:  You have a drain in place.  Every day change the dressing in the shower, wash around the skin exit site with soap & water and place a new dressing of gauze or band aid around the skin every day.  Keep the drain site clean & dry.  Follow up in our surgery office to discuss plans for drain removal in the near future (usually 10-20 days)     ACTIVITIES as tolerated:   You may resume regular (light) daily activities beginning the next day--such as daily self-care, walking, climbing stairs--gradually increasing activities as tolerated.  If you can walk 30 minutes without difficulty, it is safe to try more intense activity such as jogging, treadmill, bicycling, low-impact aerobics, swimming, etc. Save the most intensive and strenuous activity for last such as sit-ups, heavy lifting, contact sports, etc  Refrain from any heavy lifting or straining until you are off narcotics  for pain control.   DO NOT PUSH THROUGH PAIN.  Let pain be your guide: If it hurts to do something, don't do it.  Pain is your body warning you to avoid that activity for another week until the pain goes down. You may drive when you are no longer taking prescription pain medication, you can comfortably wear a seatbelt, and you can safely maneuver your car and apply brakes. You may have sexual intercourse when it is comfortable.  FOLLOW UP in our office Please call [facility] at [phone] to set up an appointment to see your surgeon in the office for a follow-up appointment approximately 2-3 weeks after your surgery. Make sure that you call for this appointment the day you arrive home to insure a convenient appointment time.  10. IF YOU HAVE DISABILITY OR FAMILY LEAVE FORMS, BRING THEM TO THE OFFICE FOR PROCESSING.  DO NOT GIVE THEM TO YOUR DOCTOR.   WHEN TO CALL US  [phone]: Poor pain control Reactions / problems with new medications (rash/itching, nausea, etc)  Fever over 101.5 F (38.5 C) Inability to urinate Nausea and/or vomiting Worsening swelling or bruising Continued bleeding from  incision. Increased pain, redness, or drainage from the incision   The clinic staff is available to answer your questions during regular business hours (8:30am-5pm).  Please don't hesitate to call and ask to speak to one of our nurses for clinical concerns.   If you have a medical emergency, go to the nearest emergency room or call 911.  A surgeon from [facility] is always on call at the [facility], [place] [address], [place], [place]  [address] ? MAIN: [phone] ? TOLL FREE: [phone] ?  FAX [phone] [email or web address]  ##############################################################

## 2023-11-21 NOTE — Op Note (Addendum)
 11/21/2023  12:13 PM  PATIENT:  Theodore Stevenson  68 y.o. male  Patient Care Team: Patient, No Pcp Per as PCP - General (General Practice)  PRE-OPERATIVE DIAGNOSIS:  PNEUMOPERITONEUM, PROBABLE PERFORATED GASTRIC ULCER  POST-OPERATIVE DIAGNOSIS:  PERFORATED GASTRIC ULCER WITH ABSCESSES  PROCEDURE:  DIAGNOSTIC LAPAROSCOPY DRAINAGE OF ABSCESSES OMENTAL GRAHAM PATCH OF ULCER  SURGEON:  Elspeth KYM Schultze, MD  ASSISTANT:  MYRTIS DOROTHA Null, MD  An experienced assistant was required given the standard of surgical care given the complexity of the case.  This assistant was needed for exposure, dissection, suction, tissue approximation, retraction, perception, etc  ANESTHESIA:  General endotracheal intubation anesthesia (GETA) and Local & regional field block at incision(s) for perioperative & postoperative pain control provided with 30mL of bupivicaine 0.25% with epinephrine   Estimated Blood Loss (EBL):   Total I/O In: 500 [IV Piggyback:500] Out: 200 [Urine:200].   (See anesthesia record)  Delay start of Pharmacological VTE agent (>24hrs) due to concerns of significant anemia, surgical blood loss, or risk of bleeding?:  no  DRAINS: (None) and 19 Fr Blake drain with tip resting in the RUQ   SPECIMEN:  (no specimen)  DISPOSITION OF SPECIMEN:  (not applicable)  COUNTS:  Sponge, needle, & instrument counts CORRECT at the conclusion of the case.      PLAN OF CARE: Admit to inpatient   PATIENT DISPOSITION:  PACU - hemodynamically stable.  INDICATION:   Patient abdominal pain and few dots of free air in upper abdomen.  Question of ulcer perforation.  No fever or white count and not very tender so conservatively managed.  Felt better yesterday but this morning feeling worse.  He recommended laparoscopic possible open exploration Surgery:  The anatomy & physiology of the digestive tract was discussed.  The pathophysiology of perforation was discussed.  Differential diagnosis such as perforated  ulcer or colon, etc was discussed.   Natural history risks without surgery such as death was discussed.  I recommended abdominal exploration to diagnose & treat the source of the problem.  Laparoscopic & open techniques were discussed.   Risks such as bleeding, infection, abscess, leak, reoperation, bowel resection, possible ostomy, hernia, heart attack, death, and other risks were discussed.   The risks of no intervention will lead to serious problems including death.   I expressed a good likelihood that surgery will address the problem.    Goals of post-operative recovery were discussed as well.  We will work to minimize complications although risks in an emergent setting are high.   Questions were answered.  The patient expressed understanding & wishes to proceed with surgery.      OR FINDINGS:  Moderate phlegmonous contamination with brackish fluid upper abdomen & RUQ retrohepatic space.  Abscesses.  Large volume of abdominal fat and omentum trying to wall off perforation only partially.  Perforated ulcer at the prepyloric region.  4 mm punched-out defect with some bilious contamination.  Omental patch done.  Washout and drain placed given moderate peritonitis & contamination  CASE DATA: Type of patient?: LDOW CASE (Surgical Hospitalist [facility] Inpatient) Status of Case? URGENT Add On Infection Present At Time Of Surgery (PATOS)?  PURULENCE   DESCRIPTION:  Informed consent was confirmed.  The patient underwent general anaesthesia without difficulty.   Patient already had NG tube in place.  The patient was positioned appropriately.  VTE prevention in place.  The patient's abdomen was clipped, prepped, & draped in a sterile fashion.  Surgical timeout confirmed our plan.  Peritoneal entry  with a laparoscopic port was obtained using Varess spring needle entry technique in the left upper abdomen as the patient was positioned in reverse Trendelenburg.  I induced carbon dioxide insufflation.  No change  in end tidal CO2 measurements.  Full symmetrical abdominal distention.  Initial port was carefully placed using optical entry technique.  Camera inspection revealed no injury.  Extra ports were carefully placed under direct laparoscopic visualization.  Upon entering the abdomen (organ space), I encountered purulence in the right upper quadrant especially perihepatic and along the falciform ligament..  He had significant visceral fat and obesity but we did have enough distention to visualize.  Peritonitis could be seen focused around the antrum of the stomach and gallbladder.  Laparoscopically freed off the omentum to better expose some bile staining and purulence.  Broke up abscesses and serially washed out.  At the epicenter of the worst peritonitis and phlegmon/purulence we were able to easily find an anterior punched-out perforated ulcer circular defect perforation about 4 mm in size.  Located prepyloric.  Did irrigation over 3 L.  Removed peeled off phlegmon and washed out brackish solution & purulence in the retrohepatic space and upper abdomen.  Left upper quadrant and perisplenic region rather clear.  No major peritonitis infraumbilically.  I mobilized a tongue of greater omentum.  I laid it over the perforated ulcer.  I proceeded to do an omental patch by placing 2-0 V-loc suture at the distal antrum and duodenal bulb a centimeter superior to the perforation.  We then laid the omentum over the ulcer.  We then ran that V-Loc suture in a horizontal mattress running fashion taking stitches from the stomach to omentum and other side of stomach.  Then pulled the stitch to have a good omental plug of the perforation.  This is the classic Arlyss omental patch.    We did copious irrigation and reinspection of the abdominal cavity.  There is no frank evidence of appendicitis or diverticulitis.  No evidence of a perforated Meckel's diverticulum.  Given the peritonitis with abscess pockets and contamination  decided to leave a drain with the tip running along the lesser curvature stomach, over the pyloric repair, deep to the gallbladder and down in the retrohepatic space for most of the purulence and contamination was.  Secured to the skin with Prolene suture.  We then evacuated carbon dioxide to remove the ports.  Left subcostal 12 mm port was at the costal margin but I closed it primarily at the fascia with #1 PDS.  I closed the ports using Monocryl stitch a sterile dressings.   Patient extubated in recovery room in stable condition.  Anticipate antibiotics 5 more days.  CT scan to rule out delayed abscess if not better by then.  Suspect need for NG tube clamping trial and possible contrast upper GI fluoroscopy study before pulling NG tube and advancing diet in a few days.  We will see.  I discussed operative findings, updated the patient's status, discussed probable steps to recovery, and gave postoperative recommendations to the patient's spouse, [name].  Recommendations were made.  Questions were answered.  She expressed understanding & appreciation.    Elspeth KYM Schultze, M.D., F.A.C.S. Gastrointestinal and Minimally Invasive Surgery [facility] [address] [place], [place] [address] [phone] Main / Paging

## 2023-11-21 NOTE — Progress Notes (Signed)
 I have re-reviewed the the patient's records, history, medications, and allergies.  I have re-examined the patient.  I again discussed intraoperative plans and goals of post-operative recovery.  The patient agrees to proceed.  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   Avascular necrosis of hip (HCC) 07/02/2011   Hypertension 07/02/2011   Hepatitis 07/02/2011   Postoperative anemia due to acute blood loss 07/02/2011    Past Medical History:  Diagnosis Date   Arthritis    Hepatitis    1973   Hypertension    Spinal stenosis     Past Surgical History:  Procedure Laterality Date   BACK SURGERY     1987   CARPAL TUNNEL RELEASE     right and left   HIP SURGERY  06/30/2011   total left hip   JOINT REPLACEMENT     right total hip   TOTAL HIP ARTHROPLASTY  06/30/2011   Procedure: TOTAL HIP ARTHROPLASTY;  Surgeon: Maude LELON Right, MD;  Location: [facility];  Service: Orthopedics;  Laterality: Left;    Social History   Socioeconomic History   Marital status: Married    Spouse name: Not on file   Number of children: Not on file   Years of education: Not on file   Highest education level: Not on file  Occupational History   Not on file  Tobacco Use   Smoking status: Former    Current packs/day: 0.00    Average packs/day: 1 pack/day for 10.0 years (10.0 ttl pk-yrs)    Types: Cigarettes    Start date: 06/21/1996    Quit date:  06/22/2006    Years since quitting: 17.4   Smokeless tobacco: Current    Types: Chew  Vaping Use   Vaping status: Never Used  Substance and Sexual Activity   Alcohol use: No   Drug use: No   Sexual activity: Yes  Other Topics Concern   Not on file  Social History Narrative   Not on file   Social Drivers of Health   Financial Resource Strain: Not on file  Food Insecurity: No Food Insecurity (11/19/2023)   Hunger Vital Sign    Worried About Running Out of Food in the Last Year: Never true    Ran Out of Food in the Last Year: Never true  Transportation Needs: No Transportation Needs (11/19/2023)   PRAPARE - Administrator, Civil Service (Medical): No    Lack of Transportation (Non-Medical): No  Physical Activity: Not on file  Stress: Not on file  Social Connections: Moderately Integrated (11/19/2023)   Social Connection and Isolation Panel    Frequency of Communication with Friends and Family: More than three times a week    Frequency of Social Gatherings with Friends and Family: More than three times a week    Attends Religious Services: More than 4 times per year    Active Member of [employer] or Organizations: No    Attends [employer]  or Organization Meetings: Never    Marital Status: Married  Catering Manager Violence: Not At Risk (11/19/2023)   Humiliation, Afraid, Rape, and Kick questionnaire    Fear of Current or Ex-Partner: No    Emotionally Abused: No    Physically Abused: No    Sexually Abused: No    History reviewed. No pertinent family history.  Medications Prior to Admission  Medication Sig Dispense Refill Last Dose/Taking   lisinopril-hydrochlorothiazide (PRINZIDE,ZESTORETIC) 20-25 MG tablet Take 2 tablets by mouth daily.   Past Week   traMADol (ULTRAM) 50 MG tablet Take 1 tablet (50 mg total) by mouth every 6 (six) hours as needed. (Patient taking differently: Take 25 mg by mouth every 6 (six) hours as needed.) 30 tablet 0 Unknown   methylPREDNISolone  (MEDROL )  4 MG tablet Take 6 tablets day 1, take 5 tablets day 2, take 4 tablets day 3, take 3 tablets day 3, take 2 tablets day 2, take 1 tablet day 6 (Patient not taking: Reported on 11/20/2023) 21 tablet 0 Not Taking    Current Facility-Administered Medications  Medication Dose Route Frequency Provider Last Rate Last Admin   [MAR Hold] ceFEPIme (MAXIPIME) 2 g in sodium chloride  0.9 % 100 mL IVPB  2 g Intravenous Q24H Lilliston, Andrea M, [facility]   Stopped at 11/21/23 0342   [MAR Hold] HYDROmorphone  (DILAUDID ) injection 0.5-1 mg  0.5-1 mg Intravenous Q2H PRN Curvin Mt III, MD   1 mg at 11/21/23 0443   [MAR Hold] insulin aspart (novoLOG) injection 0-6 Units  0-6 Units Subcutaneous Q4H Opyd, Timothy S, MD       [MAR Hold] labetalol  (NORMODYNE ) injection 10 mg  10 mg Intravenous Q4H PRN Opyd, Timothy S, MD       [MAR Hold] magic mouthwash  15 mL Oral QID PRN Sheldon Standing, MD       ILDA Hold] menthol  (CEPACOL) lozenge 3 mg  1 lozenge Oral PRN Sheldon Standing, MD       ILDA Hold] metroNIDAZOLE  (FLAGYL ) IVPB 500 mg  500 mg Intravenous Q12H Opyd, Timothy S, MD 100 mL/hr at 11/21/23 0844 500 mg at 11/21/23 0844   [MAR Hold] naphazoline-glycerin (CLEAR EYES REDNESS) ophth solution 1-2 drop  1-2 drop Both Eyes QID PRN Sheldon Standing, MD       [MAR Hold] ondansetron  (ZOFRAN ) injection 4 mg  4 mg Intravenous Q6H PRN Opyd, Timothy S, MD       [MAR Hold] pantoprazole (PROTONIX) injection 40 mg  40 mg Intravenous Q12H Nike Southers, MD   40 mg at 11/21/23 0839   [MAR Hold] phenol (CHLORASEPTIC) mouth spray 2 spray  2 spray Mouth/Throat PRN Sheldon Standing, MD       ILDA Hold] prochlorperazine  (COMPAZINE ) injection 5 mg  5 mg Intravenous Q4H PRN Opyd, Timothy S, MD       sodium bicarbonate 150 mEq in sterile water 1,150 mL infusion   Intravenous Continuous Rai, Ripudeep K, MD 125 mL/hr at 11/21/23 0645 Infusion Verify at 11/21/23 0645   [MAR Hold] sodium chloride  (OCEAN) 0.65 % nasal spray 1-2 spray  1-2 spray Each Nare Q6H PRN  Sheldon Standing, MD       [facility] Hold] sodium chloride  0.9 % bolus 1,000 mL  1,000 mL Intravenous Q8H PRN Sheldon Standing, MD       [facility] Hold] sodium chloride  flush (NS) 0.9 % injection 3 mL  3 mL Intravenous Q12H Opyd, Timothy S, MD   3 mL at 11/21/23 0851     Allergies  Allergen Reactions   Codeine Nausea And Vomiting   Nsaids Other (See Comments)    H/o ulcers & poss perf = NO NSAIDs   Penicillins Other (See Comments)    Pt was young unsure exactly  Unknown    BP (!) 130/49 (BP Location: Right Arm)   Pulse 91   Temp 98.6 F (37 C) (Oral)   Resp 20   Ht 5' 5 (1.651 m)   Wt 83 kg   SpO2 100%   BMI 30.45 kg/m   Labs: Results for orders placed or performed during the hospital encounter of 11/19/23 (from the past 48 hours)  Urinalysis, Routine w reflex microscopic -Urine, Clean Catch     Status: Abnormal   Collection Time: 11/19/23  4:16 PM  Result Value Ref Range   Color, Urine YELLOW YELLOW   APPearance CLOUDY (A) CLEAR   Specific Gravity, Urine 1.025 1.005 - 1.030   pH 5.0 5.0 - 8.0   Glucose, UA NEGATIVE NEGATIVE mg/dL   Hgb urine dipstick NEGATIVE NEGATIVE   Bilirubin Urine SMALL (A) NEGATIVE   Ketones, ur 15 (A) NEGATIVE mg/dL   Protein, ur 30 (A) NEGATIVE mg/dL   Nitrite POSITIVE (A) NEGATIVE   Leukocytes,Ua TRACE (A) NEGATIVE    Comment: Performed at [facility], [address] [place], [place] [address]  Urinalysis, Microscopic (reflex)     Status: Abnormal   Collection Time: 11/19/23  4:16 PM  Result Value Ref Range   RBC / HPF 0-5 0 - 5 RBC/hpf   WBC, UA 6-10 0 - 5 WBC/hpf   Bacteria, UA MANY (A) NONE SEEN   Squamous Epithelial / HPF 0-5 0 - 5 /HPF   Non Squamous Epithelial PRESENT (A) NONE SEEN    Comment: Performed at [facility], [address] [place], [place] [address]  Lipase, blood     Status: None   Collection Time: 11/19/23  4:19 PM  Result Value Ref Range   Lipase 32 11 - 51 U/L    Comment: Performed at [facility], [address] [place], [place] [address]  Comprehensive metabolic panel     Status: Abnormal   Collection Time: 11/19/23  4:19 PM  Result Value Ref Range   Sodium 138 135 - 145 mmol/L   Potassium 5.7 (H) 3.5 - 5.1 mmol/L   Chloride 104 98 - 111 mmol/L   CO2 21 (L) 22 - 32 mmol/L   Glucose, Bld 212 (H) 70 - 99 mg/dL    Comment: Glucose reference range applies only to samples taken after fasting for at least 8 hours.   BUN 66 (H) 8 - 23 mg/dL   Creatinine, Ser 7.89 (H) 0.61 - 1.24 mg/dL   Calcium 8.8 (L) 8.9 - 10.3 mg/dL   Total Protein 5.6 (L) 6.5 - 8.1 g/dL   Albumin 3.4 (L) 3.5 - 5.0 g/dL   AST 29 15 - 41 U/L   ALT 58 (H) 0 - 44 U/L   Alkaline Phosphatase 59 38 - 126 U/L   Total Bilirubin 0.3 0.0 - 1.2 mg/dL   GFR, Estimated 33 (L) >60 mL/min    Comment: (NOTE) Calculated using the CKD-EPI Creatinine Equation (2021)    Anion gap 13 5 - 15    Comment: Performed at [facility], [address] [place], [place] [address]  CBC     Status: Abnormal   Collection Time: 11/19/23  4:19 PM  Result Value Ref Range   WBC 7.9 4.0 - 10.5 K/uL   RBC 3.71 (L) 4.22 - 5.81 MIL/uL   Hemoglobin 12.0 (L) 13.0 - 17.0 g/dL   HCT 63.2 (L) 60.9 - 47.9 %   MCV 98.9 80.0 - 100.0 fL   MCH 32.3 26.0 - 34.0 pg   MCHC 32.7 30.0 - 36.0 g/dL   RDW 86.6 88.4 - 84.4 %   Platelets 214 150 - 400 K/uL   nRBC 0.0 0.0 - 0.2 %    Comment: Performed at [facility], [address], [place], [place] [address]  Urine Culture     Status: Abnormal   Collection Time: 11/19/23  6:00 PM   Specimen: Urine, Clean Catch  Result Value Ref Range   Specimen Description      URINE, CLEAN CATCH Performed at [facility], [address] [place], [place] [address]    Special Requests      NONE Performed at [facility], [address] [place], [place] [address]    Culture (A)     <10,000 COLONIES/mL INSIGNIFICANT GROWTH Performed at [facility], [address], [place], [place] [address]    Report Status 11/20/2023 FINAL   CBG monitoring, ED     Status: Abnormal   Collection Time: 11/19/23  7:28 PM  Result Value Ref Range   Glucose-Capillary 157 (H) 70 - 99 mg/dL    Comment: Glucose reference range applies only to samples taken after fasting for at least 8 hours.  Blood culture (routine x 2)     Status: None (Preliminary result)   Collection Time: 11/19/23  7:35 PM   Specimen: BLOOD  Result Value Ref Range   Specimen Description      BLOOD RIGHT ANTECUBITAL Performed at [facility], [address] [place], [place] [address]    Special Requests      BOTTLES DRAWN AEROBIC AND ANAEROBIC Blood Culture adequate volume Performed at [facility], [address] [place], [place] [address]    Culture      NO GROWTH 2 DAYS Performed at [facility], [address], [place], [place] [address]    Report Status PENDING   Lactic acid, plasma     Status: Abnormal   Collection Time: 11/19/23  7:40 PM  Result Value Ref Range   Lactic Acid, Venous 3.1 (HH) 0.5 - 1.9 mmol/L    Comment: Critical Value, Read Back and verified with Louie RN at 2034 on 11/19/23 by [email or web address] Performed at [facility], [address] [place], [place] [address]   Blood culture (routine x 2)     Status: None (Preliminary result)   Collection Time: 11/19/23  7:45 PM   Specimen: BLOOD  Result Value Ref Range   Specimen Description      BLOOD LEFT ANTECUBITAL Performed at [facility], [address] [place], [place] [address]    Special Requests      BOTTLES DRAWN AEROBIC AND ANAEROBIC Blood Culture adequate volume Performed at [facility], [address] [place], [place] [address]    Culture      NO GROWTH 2 DAYS Performed at [facility], [address] [place], [place] [address]    Report Status PENDING   Lactic acid, plasma     Status: Abnormal   Collection Time: 11/19/23 10:01  PM   Result Value Ref Range   Lactic Acid, Venous 2.5 (HH) 0.5 - 1.9 mmol/L    Comment: Critical value noted. Value is consistent with previously reported and called value  Performed at [facility], [address] [place], [place] [address]   Glucose, capillary     Status: Abnormal   Collection Time: 11/20/23 12:22 AM  Result Value Ref Range   Glucose-Capillary 110 (H) 70 - 99 mg/dL    Comment: Glucose reference range applies only to samples taken after fasting for at least 8 hours.  Hemoglobin A1c     Status: None   Collection Time: 11/20/23  2:55 AM  Result Value Ref Range   Hgb A1c MFr Bld 5.3 4.8 - 5.6 %    Comment: (NOTE) Diagnosis of Diabetes The following HbA1c ranges recommended by the American Diabetes Association (ADA) may be used as an aid in the diagnosis of diabetes mellitus.  Hemoglobin             Suggested A1C NGSP%              Diagnosis  <5.7                   Non Diabetic  5.7-6.4                Pre-Diabetic  >6.4                   Diabetic  <7.0                   Glycemic control for                       adults with diabetes.     Mean Plasma Glucose 105.41 mg/dL    Comment: Performed at [facility], [address], [place], [place] [address]  Lactic acid, plasma     Status: Abnormal   Collection Time: 11/20/23  2:55 AM  Result Value Ref Range   Lactic Acid, Venous 2.3 (HH) 0.5 - 1.9 mmol/L    Comment: Critical value noted. Value is consistent with previously reported and called value  Performed at [facility], [address] [place], [place] [address]   HIV Antibody (routine testing w rflx)     Status: None   Collection Time: 11/20/23  2:56 AM  Result Value Ref Range   HIV Screen 4th Generation wRfx Non Reactive Non Reactive    Comment: Performed at [facility], [address], [place], [place] [address]  Comprehensive metabolic panel     Status: Abnormal   Collection Time: 11/20/23  2:56 AM  Result  Value Ref Range   Sodium 140 135 - 145 mmol/L   Potassium 4.9 3.5 - 5.1 mmol/L   Chloride 110 98 - 111 mmol/L   CO2 20 (L) 22 - 32 mmol/L   Glucose, Bld 125 (H) 70 - 99 mg/dL    Comment: Glucose reference range applies only to samples taken after fasting for at least 8 hours.   BUN 64 (H) 8 - 23 mg/dL   Creatinine, Ser 7.62 (H) 0.61 - 1.24 mg/dL   Calcium 8.1 (L) 8.9 - 10.3 mg/dL   Total Protein 5.1 (L) 6.5 - 8.1 g/dL   Albumin 2.9 (L) 3.5 - 5.0 g/dL   AST 25 15 - 41 U/L   ALT 46 (H) 0 - 44 U/L  Alkaline Phosphatase 45 38 - 126 U/L   Total Bilirubin 0.3 0.0 - 1.2 mg/dL   GFR, Estimated 29 (L) >60 mL/min    Comment: (NOTE) Calculated using the CKD-EPI Creatinine Equation (2021)    Anion gap 10 5 - 15    Comment: Performed at [facility], [address], [place], [place] [address]  Glucose, capillary     Status: Abnormal   Collection Time: 11/20/23  3:28 AM  Result Value Ref Range   Glucose-Capillary 109 (H) 70 - 99 mg/dL    Comment: Glucose reference range applies only to samples taken after fasting for at least 8 hours.  Glucose, capillary     Status: Abnormal   Collection Time: 11/20/23  7:49 AM  Result Value Ref Range   Glucose-Capillary 103 (H) 70 - 99 mg/dL    Comment: Glucose reference range applies only to samples taken after fasting for at least 8 hours.  Glucose, capillary     Status: Abnormal   Collection Time: 11/20/23 11:35 AM  Result Value Ref Range   Glucose-Capillary 100 (H) 70 - 99 mg/dL    Comment: Glucose reference range applies only to samples taken after fasting for at least 8 hours.  Glucose, capillary     Status: None   Collection Time: 11/20/23  4:00 PM  Result Value Ref Range   Glucose-Capillary 97 70 - 99 mg/dL    Comment: Glucose reference range applies only to samples taken after fasting for at least 8 hours.  Glucose, capillary     Status: Abnormal   Collection Time: 11/20/23  8:13 PM  Result Value Ref Range    Glucose-Capillary 106 (H) 70 - 99 mg/dL    Comment: Glucose reference range applies only to samples taken after fasting for at least 8 hours.  Glucose, capillary     Status: None   Collection Time: 11/21/23 12:08 AM  Result Value Ref Range   Glucose-Capillary 91 70 - 99 mg/dL    Comment: Glucose reference range applies only to samples taken after fasting for at least 8 hours.  Glucose, capillary     Status: None   Collection Time: 11/21/23  4:28 AM  Result Value Ref Range   Glucose-Capillary 84 70 - 99 mg/dL    Comment: Glucose reference range applies only to samples taken after fasting for at least 8 hours.  CBC     Status: Abnormal   Collection Time: 11/21/23  4:29 AM  Result Value Ref Range   WBC 9.1 4.0 - 10.5 K/uL   RBC 3.26 (L) 4.22 - 5.81 MIL/uL   Hemoglobin 10.6 (L) 13.0 - 17.0 g/dL   HCT 64.9 (L) 60.9 - 47.9 %   MCV 107.4 (H) 80.0 - 100.0 fL   MCH 32.5 26.0 - 34.0 pg   MCHC 30.3 30.0 - 36.0 g/dL   RDW 86.3 88.4 - 84.4 %   Platelets 221 150 - 400 K/uL   nRBC 0.2 0.0 - 0.2 %    Comment: Performed at [facility], [address] [place], [place] [address]  Comprehensive metabolic panel     Status: Abnormal   Collection Time: 11/21/23  4:29 AM  Result Value Ref Range   Sodium 141 135 - 145 mmol/L   Potassium 5.3 (H) 3.5 - 5.1 mmol/L   Chloride 114 (H) 98 - 111 mmol/L   CO2 15 (L) 22 - 32 mmol/L   Glucose, Bld 88 70 - 99 mg/dL  Comment: Glucose reference range applies only to samples taken after fasting for at least 8 hours.   BUN 73 (H) 8 - 23 mg/dL   Creatinine, Ser 7.32 (H) 0.61 - 1.24 mg/dL   Calcium 8.0 (L) 8.9 - 10.3 mg/dL   Total Protein 5.1 (L) 6.5 - 8.1 g/dL   Albumin 2.4 (L) 3.5 - 5.0 g/dL   AST 40 15 - 41 U/L   ALT 51 (H) 0 - 44 U/L   Alkaline Phosphatase 56 38 - 126 U/L   Total Bilirubin 0.3 0.0 - 1.2 mg/dL   GFR, Estimated 25 (L) >60 mL/min    Comment: (NOTE) Calculated using the CKD-EPI Creatinine Equation (2021)    Anion gap 12  5 - 15    Comment: Performed at [facility], [address], [place], [place] [address]  Glucose, capillary     Status: Abnormal   Collection Time: 11/21/23  7:36 AM  Result Value Ref Range   Glucose-Capillary 66 (L) 70 - 99 mg/dL    Comment: Glucose reference range applies only to samples taken after fasting for at least 8 hours.  Lactic acid, plasma     Status: None   Collection Time: 11/21/23  8:04 AM  Result Value Ref Range   Lactic Acid, Venous 1.4 0.5 - 1.9 mmol/L    Comment: Performed at [facility], [address], [place], [place] [address]  Glucose, capillary     Status: None   Collection Time: 11/21/23  9:13 AM  Result Value Ref Range   Glucose-Capillary 96 70 - 99 mg/dL    Comment: Glucose reference range applies only to samples taken after fasting for at least 8 hours.  Type and screen [facility]     Status: None (Preliminary result)   Collection Time: 11/21/23  9:49 AM  Result Value Ref Range   ABO/RH(D) PENDING    Antibody Screen PENDING    Sample Expiration      11/24/2023,2359 Performed at [facility], [address] [place], [place] [address]     Imaging / Studies: DG Abd Portable 1 View Result Date: 11/19/2023 EXAM: 1 VIEW XRAY OF THE ABDOMEN 11/19/2023 08:37:00 PM COMPARISON: None available. CLINICAL HISTORY: NGT placement. FINDINGS: LINES, TUBES AND DEVICES: A gastric catheter is noted in the distal stomach. BOWEL: No free air is seen. BONES: No acute osseous abnormality. IMPRESSION: 1. Gastric catheter in the distal stomach. 2. No free intraperitoneal air. Electronically signed by: Oneil Devonshire MD 11/19/2023 08:41 PM EST RP Workstation: [id number]   CT ABDOMEN PELVIS WO CONTRAST Result Date: 11/19/2023 EXAM: CT ABDOMEN AND PELVIS WITHOUT CONTRAST 11/19/2023 06:08:57 PM TECHNIQUE: CT of the abdomen and pelvis was performed without the administration of intravenous contrast.  Multiplanar reformatted images are provided for review. Automated exposure control, iterative reconstruction, and/or weight-based adjustment of the mA/kV was utilized to reduce the radiation dose to as low as reasonably achievable. COMPARISON: CT 07/24/2019 CLINICAL HISTORY: Abdominal pain, acute, nonlocalized. FINDINGS: LOWER CHEST: No acute abnormality. LIVER: Small collection of gas adjacent to the falciform ligament of the liver on image 30/2. Small amount of gas along the capsule of the liver on image 26. GALLBLADDER AND BILE DUCTS: Gallbladder is unremarkable. No biliary ductal dilatation. SPLEEN: No acute abnormality. PANCREAS: No acute abnormality. ADRENAL GLANDS: No acute abnormality. KIDNEYS, URETERS AND BLADDER: No stones in the kidneys or ureters. No hydronephrosis. No perinephric or periureteral stranding. Bladder poorly evaluated due to streak artifact  from the bilateral hip prosthetics. GI AND BOWEL: Stomach demonstrates no acute abnormality. There is no bowel obstruction. There is a small collection of gas positioned between the first portion of the duodenum and the left hepatic lobe on image 30 of series 2. This is seen on coronal image 70 of series 8. There are multiple diverticula of the sigmoid colon but no evidence of acute inflammation or perforation. PERITONEUM AND RETROPERITONEUM: There is a small amount of intraperitoneal free air collecting predominantly in the upper abdomen along the ventral peritoneum. For example, small collection of gas adjacent to the falciform ligament of the liver on image 30/2. Small amount of gas along the capsule of the liver on image 26. There is a small collection of gas positioned between the first portion of the duodenum and the left hepatic lobe on image 30 of series 2. This is seen on coronal image 70 of series 8. There is mild inflammatory stranding in the right upper quadrant. A small amount of fluid along the right anterior pararenal fascia. There is  trace intraperitoneal free fluid in the pelvis on the right on image 69/2. VASCULATURE: Aorta is normal in caliber. LYMPH NODES: No lymphadenopathy. REPRODUCTIVE ORGANS: No acute abnormality. BONES AND SOFT TISSUES: No acute osseous abnormality. No focal soft tissue abnormality. Small amount of intraperitoneal free air predominantly in the upper abdomen with mild inflammatory stranding in the right upper quadrant. Findings are consistent with perforation of the hollow viscus. Most likely source would be proximal duodenum related peptic ulcer disease. Secondary source although less likely would be perforated sigmoid diverticulum. Free fluid in the pelvis. Recommend emergent surgical consultation. Findings conveyed to ordering physician Ruthe, MD at time of interpretation. IMPRESSION: 1. Small amount of intraperitoneal free air predominantly in the upper abdomen with mild inflammatory stranding in the right upper quadrant, consistent with hollow viscus perforation. Most likely source is proximal duodenum (peptic ulcer disease). A perforated sigmoid diverticulum is less likely. 2. Trace intraperitoneal free fluid in the pelvis. 3. Recommend emergent surgical consultation. 4. Findings conveyed to ordering physician Ruthe, MD at time of interpretation. Electronically signed by: Norleen Boxer MD 11/19/2023 07:02 PM EST RP Workstation: [id number]     .Elspeth KYM Schultze, M.D., F.A.C.S. Gastrointestinal and Minimally Invasive Surgery [facility] [address], [address] [place], [place] [address] [phone] Main / Paging  11/21/2023 10:31 AM

## 2023-11-21 NOTE — Transfer of Care (Signed)
 Immediate Anesthesia Transfer of Care Note  Patient: Theodore Stevenson  Procedure(s) Performed: LAPAROTOMY, EXPLORATORY LAPAROSCOPY, DIAGNOSTIC (Abdomen)  Patient Location: PACU  Anesthesia Type:General  Level of Consciousness: awake, alert , and oriented  Airway & Oxygen Therapy: Patient Spontanous Breathing and Patient connected to face mask oxygen  Post-op Assessment: Report given to RN and Post -op Vital signs reviewed and stable  Post vital signs: Reviewed and stable  Last Vitals:  Vitals Value Taken Time  BP 143/57 11/21/23 12:09  Temp    Pulse 90 11/21/23 12:14  Resp 25 11/21/23 12:14  SpO2 100 % 11/21/23 12:14  Vitals shown include unfiled device data.  Last Pain:  Vitals:   11/21/23 1025  TempSrc:   PainSc: 0-No pain         Complications: No notable events documented.

## 2023-11-21 NOTE — Progress Notes (Signed)
 Hypoglycemic Event  CBG: 67  Treatment: D50 25 mL (12.5 gm)  Symptoms: None  Follow-up CBG: Time:1715 CBG Result:125  Possible Reasons for Event: Inadequate meal intake  Comments/MD notified:MD made aware, dextrose  added to fluids     Hurd, Duwaine Garre

## 2023-11-21 NOTE — Anesthesia Procedure Notes (Signed)
 Procedure Name: Intubation Date/Time: 11/21/2023 11:00 AM  Performed by: Lydiana Milley, Corean BROCKS, CRNAPre-anesthesia Checklist: Patient identified, Emergency Drugs available, Suction available and Patient being monitored Patient Re-evaluated:Patient Re-evaluated prior to induction Oxygen Delivery Method: Circle system utilized Preoxygenation: Pre-oxygenation with 100% oxygen Induction Type: IV induction and Rapid sequence Laryngoscope Size: Mac, 3 and Glidescope Grade View: Grade II Tube type: Oral Tube size: 7.5 mm Number of attempts: 1 Airway Equipment and Method: Rigid stylet Placement Confirmation: ETT inserted through vocal cords under direct vision, positive ETCO2 and breath sounds checked- equal and bilateral Secured at: 21 cm Tube secured with: Tape Dental Injury: Teeth and Oropharynx as per pre-operative assessment  Comments: NGT to suction during induction

## 2023-11-21 NOTE — Progress Notes (Signed)
 Subjective/Chief Complaint: Denies abd pain but Cr is elevating and he is more acidotic   Objective: Vital signs in last 24 hours: Temp:  [98.6 F (37 C)] 98.6 F (37 C) (11/09 0432) Pulse Rate:  [88-92] 91 (11/09 0432) Resp:  [14-20] 20 (11/09 0432) BP: (103-130)/(48-54) 130/49 (11/09 0432) SpO2:  [93 %-100 %] 100 % (11/09 0432) Last BM Date : 11/18/23  Intake/Output from previous day: 11/08 0701 - 11/09 0700 In: 3010 [P.O.:20; I.V.:2450; NG/GT:240; IV Piggyback:300] Out: 100 [Emesis/NG output:100] Intake/Output this shift: No intake/output data recorded.  General appearance: alert and cooperative Resp: clear to auscultation bilaterally Cardio: regular rate and rhythm GI: soft, distended  Lab Results:  Recent Labs    11/19/23 1619 11/21/23 0429  WBC 7.9 9.1  HGB 12.0* 10.6*  HCT 36.7* 35.0*  PLT 214 221   BMET Recent Labs    11/20/23 0256 11/21/23 0429  NA 140 141  K 4.9 5.3*  CL 110 114*  CO2 20* 15*  GLUCOSE 125* 88  BUN 64* 73*  CREATININE 2.37* 2.67*  CALCIUM 8.1* 8.0*   PT/INR No results for input(s): LABPROT, INR in the last 72 hours. ABG No results for input(s): PHART, HCO3 in the last 72 hours.  Invalid input(s): PCO2, PO2  Studies/Results: DG Abd Portable 1 View Result Date: 11/19/2023 EXAM: 1 VIEW XRAY OF THE ABDOMEN 11/19/2023 08:37:00 PM COMPARISON: None available. CLINICAL HISTORY: NGT placement. FINDINGS: LINES, TUBES AND DEVICES: A gastric catheter is noted in the distal stomach. BOWEL: No free air is seen. BONES: No acute osseous abnormality. IMPRESSION: 1. Gastric catheter in the distal stomach. 2. No free intraperitoneal air. Electronically signed by: Oneil Devonshire MD 11/19/2023 08:41 PM EST RP Workstation: [id number]   CT ABDOMEN PELVIS WO CONTRAST Result Date: 11/19/2023 EXAM: CT ABDOMEN AND PELVIS WITHOUT CONTRAST 11/19/2023 06:08:57 PM TECHNIQUE: CT of the abdomen and pelvis was performed without the administration  of intravenous contrast. Multiplanar reformatted images are provided for review. Automated exposure control, iterative reconstruction, and/or weight-based adjustment of the mA/kV was utilized to reduce the radiation dose to as low as reasonably achievable. COMPARISON: CT 07/24/2019 CLINICAL HISTORY: Abdominal pain, acute, nonlocalized. FINDINGS: LOWER CHEST: No acute abnormality. LIVER: Small collection of gas adjacent to the falciform ligament of the liver on image 30/2. Small amount of gas along the capsule of the liver on image 26. GALLBLADDER AND BILE DUCTS: Gallbladder is unremarkable. No biliary ductal dilatation. SPLEEN: No acute abnormality. PANCREAS: No acute abnormality. ADRENAL GLANDS: No acute abnormality. KIDNEYS, URETERS AND BLADDER: No stones in the kidneys or ureters. No hydronephrosis. No perinephric or periureteral stranding. Bladder poorly evaluated due to streak artifact from the bilateral hip prosthetics. GI AND BOWEL: Stomach demonstrates no acute abnormality. There is no bowel obstruction. There is a small collection of gas positioned between the first portion of the duodenum and the left hepatic lobe on image 30 of series 2. This is seen on coronal image 70 of series 8. There are multiple diverticula of the sigmoid colon but no evidence of acute inflammation or perforation. PERITONEUM AND RETROPERITONEUM: There is a small amount of intraperitoneal free air collecting predominantly in the upper abdomen along the ventral peritoneum. For example, small collection of gas adjacent to the falciform ligament of the liver on image 30/2. Small amount of gas along the capsule of the liver on image 26. There is a small collection of gas positioned between the first portion of the duodenum and the left hepatic lobe on  image 30 of series 2. This is seen on coronal image 70 of series 8. There is mild inflammatory stranding in the right upper quadrant. A small amount of fluid along the right anterior  pararenal fascia. There is trace intraperitoneal free fluid in the pelvis on the right on image 69/2. VASCULATURE: Aorta is normal in caliber. LYMPH NODES: No lymphadenopathy. REPRODUCTIVE ORGANS: No acute abnormality. BONES AND SOFT TISSUES: No acute osseous abnormality. No focal soft tissue abnormality. Small amount of intraperitoneal free air predominantly in the upper abdomen with mild inflammatory stranding in the right upper quadrant. Findings are consistent with perforation of the hollow viscus. Most likely source would be proximal duodenum related peptic ulcer disease. Secondary source although less likely would be perforated sigmoid diverticulum. Free fluid in the pelvis. Recommend emergent surgical consultation. Findings conveyed to ordering physician Ruthe, MD at time of interpretation. IMPRESSION: 1. Small amount of intraperitoneal free air predominantly in the upper abdomen with mild inflammatory stranding in the right upper quadrant, consistent with hollow viscus perforation. Most likely source is proximal duodenum (peptic ulcer disease). A perforated sigmoid diverticulum is less likely. 2. Trace intraperitoneal free fluid in the pelvis. 3. Recommend emergent surgical consultation. 4. Findings conveyed to ordering physician Ruthe, MD at time of interpretation. Electronically signed by: Norleen Boxer MD 11/19/2023 07:02 PM EST RP Workstation: [id number]    Anti-infectives: Anti-infectives (From admission, onward)    Start     Dose/Rate Route Frequency Ordered Stop   11/21/23 0300  ceFEPIme (MAXIPIME) 2 g in sodium chloride  0.9 % 100 mL IVPB        2 g 200 mL/hr over 30 Minutes Intravenous Every 24 hours 11/20/23 0525     11/20/23 0800  metroNIDAZOLE  (FLAGYL ) IVPB 500 mg        500 mg 100 mL/hr over 60 Minutes Intravenous Every 12 hours 11/19/23 2351     11/20/23 0100  ceFEPIme (MAXIPIME) 2 g in sodium chloride  0.9 % 100 mL IVPB  Status:  Discontinued        2 g 200 mL/hr over 30  Minutes Intravenous Every 12 hours 11/19/23 2359 11/20/23 0525   11/20/23 0045  vancomycin (VANCOREADY) IVPB 500 mg/100 mL        500 mg 100 mL/hr over 60 Minutes Intravenous  Once 11/19/23 2353 11/20/23 0144   11/19/23 1930  vancomycin (VANCOCIN) IVPB 1000 mg/200 mL premix       Placed in Followed by Linked Group   1,000 mg 200 mL/hr over 60 Minutes Intravenous  Once 11/19/23 1922 11/19/23 2223   11/19/23 1930  vancomycin (VANCOCIN) 500 mg in sodium chloride  0.9 % 100 mL IVPB  Status:  Discontinued       Placed in Followed by Linked Group   500 mg 100 mL/hr over 60 Minutes Intravenous  Once 11/19/23 1922 11/19/23 2353   11/19/23 1930  metroNIDAZOLE  (FLAGYL ) IVPB 500 mg        500 mg 100 mL/hr over 60 Minutes Intravenous  Once 11/19/23 1923 11/19/23 2223   11/19/23 1815  cefTRIAXone (ROCEPHIN) 2 g in sodium chloride  0.9 % 100 mL IVPB        2 g 200 mL/hr over 30 Minutes Intravenous  Once 11/19/23 1807 11/19/23 1929       Assessment/Plan: s/p * No surgery found * Even though he seems to have no abd pain his Cr is elevating and he is becoming more acidotic. At this point I feel he is not tolerating nonoperative management  and will need exploration. I have discussed with him the risks and benefits of the surgery as well as some of the technical aspects and he understands and wishes to proceed  LOS: 2 days    Deward Null III 11/21/2023

## 2023-11-22 ENCOUNTER — Encounter (HOSPITAL_COMMUNITY): Payer: Self-pay | Admitting: Surgery

## 2023-11-22 DIAGNOSIS — E872 Acidosis, unspecified: Secondary | ICD-10-CM

## 2023-11-22 DIAGNOSIS — I10 Essential (primary) hypertension: Secondary | ICD-10-CM | POA: Diagnosis not present

## 2023-11-22 DIAGNOSIS — R739 Hyperglycemia, unspecified: Secondary | ICD-10-CM | POA: Diagnosis not present

## 2023-11-22 DIAGNOSIS — R198 Other specified symptoms and signs involving the digestive system and abdomen: Secondary | ICD-10-CM

## 2023-11-22 DIAGNOSIS — N179 Acute kidney failure, unspecified: Secondary | ICD-10-CM | POA: Diagnosis not present

## 2023-11-22 DIAGNOSIS — K279 Peptic ulcer, site unspecified, unspecified as acute or chronic, without hemorrhage or perforation: Secondary | ICD-10-CM | POA: Diagnosis not present

## 2023-11-22 LAB — COMPREHENSIVE METABOLIC PANEL WITH GFR
ALT: 44 U/L (ref 0–44)
AST: 38 U/L (ref 15–41)
Albumin: 2.7 g/dL — ABNORMAL LOW (ref 3.5–5.0)
Alkaline Phosphatase: 68 U/L (ref 38–126)
Anion gap: 11 (ref 5–15)
BUN: 53 mg/dL — ABNORMAL HIGH (ref 8–23)
CO2: 21 mmol/L — ABNORMAL LOW (ref 22–32)
Calcium: 7.8 mg/dL — ABNORMAL LOW (ref 8.9–10.3)
Chloride: 109 mmol/L (ref 98–111)
Creatinine, Ser: 1.53 mg/dL — ABNORMAL HIGH (ref 0.61–1.24)
GFR, Estimated: 49 mL/min — ABNORMAL LOW (ref >60)
Glucose, Bld: 59 mg/dL — ABNORMAL LOW (ref 70–99)
Potassium: 3.7 mmol/L (ref 3.5–5.1)
Sodium: 140 mmol/L (ref 135–145)
Total Bilirubin: 0.3 mg/dL (ref 0.0–1.2)
Total Protein: 5.2 g/dL — ABNORMAL LOW (ref 6.5–8.1)

## 2023-11-22 LAB — GLUCOSE, CAPILLARY
Glucose-Capillary: 110 mg/dL — ABNORMAL HIGH (ref 70–99)
Glucose-Capillary: 117 mg/dL — ABNORMAL HIGH (ref 70–99)
Glucose-Capillary: 127 mg/dL — ABNORMAL HIGH (ref 70–99)
Glucose-Capillary: 137 mg/dL — ABNORMAL HIGH (ref 70–99)
Glucose-Capillary: 138 mg/dL — ABNORMAL HIGH (ref 70–99)
Glucose-Capillary: 57 mg/dL — ABNORMAL LOW (ref 70–99)
Glucose-Capillary: 90 mg/dL (ref 70–99)

## 2023-11-22 LAB — CBC
HCT: 29.2 % — ABNORMAL LOW (ref 39.0–52.0)
Hemoglobin: 9.1 g/dL — ABNORMAL LOW (ref 13.0–17.0)
MCH: 31.8 pg (ref 26.0–34.0)
MCHC: 31.2 g/dL (ref 30.0–36.0)
MCV: 102.1 fL — ABNORMAL HIGH (ref 80.0–100.0)
Platelets: 174 K/uL (ref 150–400)
RBC: 2.86 MIL/uL — ABNORMAL LOW (ref 4.22–5.81)
RDW: 13.6 % (ref 11.5–15.5)
WBC: 7.7 K/uL (ref 4.0–10.5)
nRBC: 0.3 % — ABNORMAL HIGH (ref 0.0–0.2)

## 2023-11-22 LAB — PHOSPHORUS: Phosphorus: 2.7 mg/dL (ref 2.5–4.6)

## 2023-11-22 LAB — MAGNESIUM: Magnesium: 2 mg/dL (ref 1.7–2.4)

## 2023-11-22 LAB — LIPASE, BLOOD: Lipase: 11 U/L (ref 11–51)

## 2023-11-22 MED ORDER — ENOXAPARIN SODIUM 40 MG/0.4ML IJ SOSY
40.0000 mg | PREFILLED_SYRINGE | INTRAMUSCULAR | Status: DC
  Administered 2023-11-22 – 2023-11-29 (×8): 40 mg via SUBCUTANEOUS
  Filled 2023-11-22 (×8): qty 0.4

## 2023-11-22 MED ORDER — ACETAMINOPHEN 10 MG/ML IV SOLN
1000.0000 mg | Freq: Four times a day (QID) | INTRAVENOUS | Status: AC
  Administered 2023-11-22 – 2023-11-23 (×4): 1000 mg via INTRAVENOUS
  Filled 2023-11-22 (×4): qty 100

## 2023-11-22 MED ORDER — DEXTROSE 50 % IV SOLN
12.5000 g | INTRAVENOUS | Status: AC
  Administered 2023-11-22: 12.5 g via INTRAVENOUS
  Filled 2023-11-22: qty 50

## 2023-11-22 MED ORDER — METHOCARBAMOL 1000 MG/10ML IJ SOLN
500.0000 mg | Freq: Three times a day (TID) | INTRAMUSCULAR | Status: DC
  Administered 2023-11-22 – 2023-11-29 (×22): 500 mg via INTRAVENOUS
  Filled 2023-11-22 (×20): qty 10

## 2023-11-22 NOTE — Progress Notes (Signed)
 NGT sticky bridle continues to come off, Found NGT at 65. Advanced NGT to 70cm, reassessed placement with 30 cc air (confirmed) and placed AMT bridle for securement of NGT.

## 2023-11-22 NOTE — Progress Notes (Signed)
 PROGRESS NOTE    Theodore Stevenson  FMW:[id number] DOB: Sep 11, 1955 DOA: 11/19/2023 PCP: Patient, No Pcp Per    Chief Complaint  Patient presents with   Abdominal Pain    Brief Narrative:  Patient is a 68 year old male with HTN, spinal stenosis, chronic back pain presented to ED with severe abdominal pain.  Patient reported few days of bloating and indigestion, then developed severe abdominal pain in the right upper abdomen.  No nausea vomiting or diarrhea, fevers.  He was previously using Aleve and ibuprofen but not recently.  Patient has been taking over-the-counter analgesics called pain away that contains aspirin.  Also reported that he was intentionally trying to lose weight so has not been eating or drinking much. In ED, potassium 5.7, creatinine 2.1, lactic acidosis, CT abdomen concerning for small amount of intraperitoneal free air in the upper abdomen with RUQ inflammation most concerning for proximal duodenal perforation General Surgery was consulted, NG tube placed, IV Protonix, broad-spectrum antibiotics.  Patient was admitted for further workup. Patient subsequently underwent diagnostic laparoscopy, drainage of abscesses, omental Graham patch of ulcer per general surgery on 11/21/2023.   Assessment & Plan:   Principal Problem:   Perforated prepyloric ulcer s/p lap omental Arlyss patch/washout 11/21/2023 Active Problems:   Hypertension   AKI (acute kidney injury)   Hyperkalemia   Hyperglycemia   UTI (urinary tract infection)   PUD (peptic ulcer disease)   Perforated abdominal viscus   Acidosis, lactic  #1 micro pneumoperitoneum, perforated gastric ulcer with abscesses  -status post diagnostic laparoscopy, drainage of abscesses, omental Graham patch of ulcer per general surgery, Dr. Sheldon 11/21/2023.  - NG tube repositioned per general surgery this morning. - Abdomen distended with decreased bowel sounds. - Patient currently n.p.o. on bowel rest and NG tube in place. -  Continue current pain regimen per general surgery. - Continue IV PPI twice daily. - Continue empiric IV antibiotics of cefepime, Flagyl . -Per general surgery patient will need 5 days antibiotics postop. -Dr. Davia, discussed case with [facility], Dr. Kriss on 11/8, it is noted that office will call to arrange outpatient appointment time after discharge. - Per general surgery.  2.  AKI with hyperkalemia, lactic acidosis, NAGMA -Worsening renal function likely secondary to prerenal azotemia in the setting of ACE inhibitor and diuretic. - Urinalysis done on admission trace leukocytes, nitrite positive, many bacteria, 30 of protein, specific gravity 1.025. - CT abdomen and pelvis done negative for hydronephrosis. - Creatinine noted to have trended up to as high as 2.67 with hyperkalemia, worsening acidosis likely secondary to problem #1. - IV fluids changed to bicarb drip. - Urine output of 1.425 L over the past 24 hours. - Renal function trending back down with creatinine down to 1.53 today. - Acidosis improving bicarb up to 21. - Hyperkalemia resolved. - Continue hydration with bicarb drip for another 24 hours and could likely transition to LR versus normal saline.  3.  Hyperkalemia -Status post Lokelma. - Potassium at 3.7 today.  4.  Hyperglycemia on admission -Hemoglobin A1c noted at 5.3.  5.  UTI ruled out -Urinalysis concerning for UTI. - Urine cultures with <10,000 colonies, frequent growth. - Patient on IV antibiotics secondary to problem #1.  6.  Obesity class II -BMI 30.45 kg/m. - Lifestyle modification. - Outpatient follow-up with PCP.   DVT prophylaxis: SCDs Code Status: Full Family Communication: Updated patient, wife at bedside. Disposition: TBD  Status is: Inpatient Remains inpatient appropriate because: Severity of illness   Consultants:  General Surgery: Dr. Curvin III 11/20/2023  Procedures:  CT abdomen and pelvis 11/19/2023 Abdominal films  11/19/2023 Diagnostic laparoscopy/drainage of abscess/omental Arlyss patch of ulcer per general surgery: Dr. Sheldon 11/21/2023  Antimicrobials:  Anti-infectives (From admission, onward)    Start     Dose/Rate Route Frequency Ordered Stop   11/22/23 0300  ceFEPIme (MAXIPIME) 2 g in sodium chloride  0.9 % 100 mL IVPB        2 g 200 mL/hr over 30 Minutes Intravenous Every 24 hours 11/21/23 1323 11/27/23 0259   11/21/23 2000  metroNIDAZOLE  (FLAGYL ) IVPB 500 mg        500 mg 100 mL/hr over 60 Minutes Intravenous Every 12 hours 11/21/23 1323 11/26/23 1959   11/21/23 0300  ceFEPIme (MAXIPIME) 2 g in sodium chloride  0.9 % 100 mL IVPB  Status:  Discontinued        2 g 200 mL/hr over 30 Minutes Intravenous Every 24 hours 11/20/23 0525 11/21/23 1323   11/20/23 0800  metroNIDAZOLE  (FLAGYL ) IVPB 500 mg  Status:  Discontinued        500 mg 100 mL/hr over 60 Minutes Intravenous Every 12 hours 11/19/23 2351 11/21/23 1323   11/20/23 0100  ceFEPIme (MAXIPIME) 2 g in sodium chloride  0.9 % 100 mL IVPB  Status:  Discontinued        2 g 200 mL/hr over 30 Minutes Intravenous Every 12 hours 11/19/23 2359 11/20/23 0525   11/20/23 0045  vancomycin (VANCOREADY) IVPB 500 mg/100 mL        500 mg 100 mL/hr over 60 Minutes Intravenous  Once 11/19/23 2353 11/20/23 0144   11/19/23 1930  vancomycin (VANCOCIN) IVPB 1000 mg/200 mL premix       Placed in Followed by Linked Group   1,000 mg 200 mL/hr over 60 Minutes Intravenous  Once 11/19/23 1922 11/19/23 2223   11/19/23 1930  vancomycin (VANCOCIN) 500 mg in sodium chloride  0.9 % 100 mL IVPB  Status:  Discontinued       Placed in Followed by Linked Group   500 mg 100 mL/hr over 60 Minutes Intravenous  Once 11/19/23 1922 11/19/23 2353   11/19/23 1930  metroNIDAZOLE  (FLAGYL ) IVPB 500 mg        500 mg 100 mL/hr over 60 Minutes Intravenous  Once 11/19/23 1923 11/19/23 2223   11/19/23 1815  cefTRIAXone (ROCEPHIN) 2 g in sodium chloride  0.9 % 100 mL IVPB        2 g 200  mL/hr over 30 Minutes Intravenous  Once 11/19/23 1807 11/19/23 1929         Subjective: Patient laying in bed.  NG tube in place.  Denies any chest pain or shortness of breath.  States some improvement with abdominal pain still with abdominal discomfort.  Complaining of restless legs.  Passing flatus.  Stated had a bowel movement early on.  Wife at bedside.  Objective: Vitals:   11/21/23 1935 11/22/23 0441 11/22/23 1409 11/22/23 1624  BP: (!) 142/71 (!) 163/74 133/63   Pulse: 80 94 83   Resp: 17 15  14   Temp: 97.7 F (36.5 C) 98.2 F (36.8 C) 98 F (36.7 C)   TempSrc: Oral  Oral   SpO2: 98% 97% 96%   Weight:      Height:        Intake/Output Summary (Last 24 hours) at 11/22/2023 1632 Last data filed at 11/22/2023 1011 Gross per 24 hour  Intake 2831.85 ml  Output 1775 ml  Net 1056.85 ml  Filed Weights   11/19/23 1614  Weight: 83 kg    Examination:  General exam: Appears calm and comfortable.  NG tube in place. Respiratory system: Clear to auscultation anterior lung fields.  No wheezes, no crackles, no rhonchi.  Good air movement.  Speaking full sentences.SABRA Respiratory effort normal. Cardiovascular system: S1 & S2 heard, RRR. No JVD, murmurs, rubs, gallops or clicks. No pedal edema. Gastrointestinal system: Abdomen is distended, hypoactive bowel sounds, nontender to palpation, no rebound, no guarding.  Incision sites c/d/I.  Central nervous system: Alert and oriented. No focal neurological deficits. Extremities: Symmetric 5 x 5 power. Skin: No rashes, lesions or ulcers Psychiatry: Judgement and insight appear normal. Mood & affect appropriate.     Data Reviewed: I have personally reviewed following labs and imaging studies  CBC: Recent Labs  Lab 11/19/23 1619 11/21/23 0429 11/22/23 0425  WBC 7.9 9.1 7.7  HGB 12.0* 10.6* 9.1*  HCT 36.7* 35.0* 29.2*  MCV 98.9 107.4* 102.1*  PLT 214 221 174    Basic Metabolic Panel: Recent Labs  Lab 11/19/23 1619  11/20/23 0256 11/21/23 0429 11/21/23 1433 11/22/23 0425  NA 138 140 141 140 140  K 5.7* 4.9 5.3* 4.3 3.7  CL 104 110 114* 112* 109  CO2 21* 20* 15* 17* 21*  GLUCOSE 212* 125* 88 87 59*  BUN 66* 64* 73* 67* 53*  CREATININE 2.10* 2.37* 2.67* 2.01* 1.53*  CALCIUM 8.8* 8.1* 8.0* 7.5* 7.8*  MG  --   --   --   --  2.0  PHOS  --   --   --   --  2.7    GFR: Estimated Creatinine Clearance: 44.5 mL/min (A) (by C-G formula based on SCr of 1.53 mg/dL (H)).  Liver Function Tests: Recent Labs  Lab 11/19/23 1619 11/20/23 0256 11/21/23 0429 11/22/23 0425  AST 29 25 40 38  ALT 58* 46* 51* 44  ALKPHOS 59 45 56 68  BILITOT 0.3 0.3 0.3 0.3  PROT 5.6* 5.1* 5.1* 5.2*  ALBUMIN 3.4* 2.9* 2.4* 2.7*    CBG: Recent Labs  Lab 11/22/23 0002 11/22/23 0441 11/22/23 0510 11/22/23 0730 11/22/23 1200  GLUCAP 90 57* 138* 110* 117*     Recent Results (from the past 240 hours)  Urine Culture     Status: Abnormal   Collection Time: 11/19/23  6:00 PM   Specimen: Urine, Clean Catch  Result Value Ref Range Status   Specimen Description   Final    URINE, CLEAN CATCH Performed at [facility], [address] [place], [place] [address]    Special Requests   Final    NONE Performed at [facility], [address] [place], [place] [address]    Culture (A)  Final    <10,000 COLONIES/mL INSIGNIFICANT GROWTH Performed at [facility], [address] [place], [place] [address]    Report Status 11/20/2023 FINAL  Final  Blood culture (routine x 2)     Status: None (Preliminary result)   Collection Time: 11/19/23  7:35 PM   Specimen: BLOOD  Result Value Ref Range Status   Specimen Description   Final    BLOOD RIGHT ANTECUBITAL Performed at [facility], [address] [place], [place] [address]    Special Requests   Final    BOTTLES DRAWN AEROBIC AND ANAEROBIC Blood Culture adequate volume Performed at [facility], [address],  [place], [place] [address]  Culture   Final    NO GROWTH 3 DAYS Performed at [facility], [address], [place], [place] [address]    Report Status PENDING  Incomplete  Blood culture (routine x 2)     Status: None (Preliminary result)   Collection Time: 11/19/23  7:45 PM   Specimen: BLOOD  Result Value Ref Range Status   Specimen Description   Final    BLOOD LEFT ANTECUBITAL Performed at [facility], [address] [place], [place] [address]    Special Requests   Final    BOTTLES DRAWN AEROBIC AND ANAEROBIC Blood Culture adequate volume Performed at [facility], [address] [place], [place] [address]    Culture   Final    NO GROWTH 3 DAYS Performed at [facility], [address], [place], [place] [address]    Report Status PENDING  Incomplete         Radiology Studies: No results found.      Scheduled Meds:  enoxaparin (LOVENOX) injection  40 mg Subcutaneous Q24H   insulin aspart  0-6 Units Subcutaneous Q4H   methocarbamol  (ROBAXIN ) injection  500 mg Intravenous Q8H   pantoprazole (PROTONIX) IV  40 mg Intravenous Q12H   sodium chloride  flush  3 mL Intravenous Q12H   Continuous Infusions:  acetaminophen  1,000 mg (11/22/23 1314)   ceFEPime (MAXIPIME) IV 2 g (11/22/23 0355)   dextrose  5 % and 0.9 % NaCl 50 mL/hr at 11/22/23 0444   lactated ringers      metronidazole  500 mg (11/22/23 1005)   sodium bicarbonate 150 mEq in dextrose  5 % 1,150 mL infusion 125 mL/hr at 11/22/23 0457   sodium chloride        LOS: 3 days    Time spent: 40 minutes    Toribio Hummer, MD [facility]   To contact the attending provider between 7A-7P or the covering provider during after hours 7P-7A, please log into the web site [email or web address] and access using universal [place] password for that web site. If you do not have the password, please call the hospital operator.  11/22/2023, 4:32 PM

## 2023-11-22 NOTE — Progress Notes (Signed)
 Hypoglycemic Event  CBG: 57  Treatment: D50 25 mL (12.5 gm)  Symptoms: None  Follow-up CBG: Time:0510 CBG Result:138  Possible Reasons for Event: Inadequate meal intake  Comments/MD notified:NP Blondie notified and increased D5NS to 52ml/hr    Theodore Stevenson

## 2023-11-22 NOTE — Progress Notes (Signed)
 1 Day Post-Op  Subjective: Patient having issues with his legs this morning being restless.  Had a BM earlier today, but quite distended.  NGT doesn't seem to be working.  Wife and daughter at bedside.   Objective: Vital signs in last 24 hours: Temp:  [97.7 F (36.5 C)-99 F (37.2 C)] 98.2 F (36.8 C) (11/10 0441) Pulse Rate:  [80-94] 94 (11/10 0441) Resp:  [15-27] 15 (11/10 0441) BP: (132-163)/(57-74) 163/74 (11/10 0441) SpO2:  [93 %-100 %] 97 % (11/10 0441) Last BM Date : 11/18/23  Intake/Output from previous day: 11/09 0701 - 11/10 0700 In: 3218.9 [P.O.:10; I.V.:2408.9; IV Piggyback:800] Out: 1865 [Urine:1425; Emesis/NG output:100; Drains:340] Intake/Output this shift: No intake/output data recorded.  PE: Gen: NAD Abd: distended and somewhat tympanitic, appropriately tender, incisions covered with gauze and tegaderm.  JP with just serous output, 340cc noted.  NGT adjusted and now working well.  Lab Results:  Recent Labs    11/21/23 0429 11/22/23 0425  WBC 9.1 7.7  HGB 10.6* 9.1*  HCT 35.0* 29.2*  PLT 221 174   BMET Recent Labs    11/21/23 1433 11/22/23 0425  NA 140 140  K 4.3 3.7  CL 112* 109  CO2 17* 21*  GLUCOSE 87 59*  BUN 67* 53*  CREATININE 2.01* 1.53*  CALCIUM 7.5* 7.8*   PT/INR No results for input(s): LABPROT, INR in the last 72 hours. CMP     Component Value Date/Time   NA 140 11/22/2023 0425   K 3.7 11/22/2023 0425   CL 109 11/22/2023 0425   CO2 21 (L) 11/22/2023 0425   GLUCOSE 59 (L) 11/22/2023 0425   BUN 53 (H) 11/22/2023 0425   CREATININE 1.53 (H) 11/22/2023 0425   CALCIUM 7.8 (L) 11/22/2023 0425   PROT 5.2 (L) 11/22/2023 0425   ALBUMIN 2.7 (L) 11/22/2023 0425   AST 38 11/22/2023 0425   ALT 44 11/22/2023 0425   ALKPHOS 68 11/22/2023 0425   BILITOT 0.3 11/22/2023 0425   GFRNONAA 49 (L) 11/22/2023 0425   GFRAA >60 07/27/2019 0528   Lipase     Component Value Date/Time   LIPASE 11 11/22/2023 0425        Studies/Results: No results found.  Anti-infectives: Anti-infectives (From admission, onward)    Start     Dose/Rate Route Frequency Ordered Stop   11/22/23 0300  ceFEPIme (MAXIPIME) 2 g in sodium chloride  0.9 % 100 mL IVPB        2 g 200 mL/hr over 30 Minutes Intravenous Every 24 hours 11/21/23 1323 11/27/23 0259   11/21/23 2000  metroNIDAZOLE  (FLAGYL ) IVPB 500 mg        500 mg 100 mL/hr over 60 Minutes Intravenous Every 12 hours 11/21/23 1323 11/26/23 1959   11/21/23 0300  ceFEPIme (MAXIPIME) 2 g in sodium chloride  0.9 % 100 mL IVPB  Status:  Discontinued        2 g 200 mL/hr over 30 Minutes Intravenous Every 24 hours 11/20/23 0525 11/21/23 1323   11/20/23 0800  metroNIDAZOLE  (FLAGYL ) IVPB 500 mg  Status:  Discontinued        500 mg 100 mL/hr over 60 Minutes Intravenous Every 12 hours 11/19/23 2351 11/21/23 1323   11/20/23 0100  ceFEPIme (MAXIPIME) 2 g in sodium chloride  0.9 % 100 mL IVPB  Status:  Discontinued        2 g 200 mL/hr over 30 Minutes Intravenous Every 12 hours 11/19/23 2359 11/20/23 0525   11/20/23 0045  vancomycin (  VANCOREADY) IVPB 500 mg/100 mL        500 mg 100 mL/hr over 60 Minutes Intravenous  Once 11/19/23 2353 11/20/23 0144   11/19/23 1930  vancomycin (VANCOCIN) IVPB 1000 mg/200 mL premix       Placed in Followed by Linked Group   1,000 mg 200 mL/hr over 60 Minutes Intravenous  Once 11/19/23 1922 11/19/23 2223   11/19/23 1930  vancomycin (VANCOCIN) 500 mg in sodium chloride  0.9 % 100 mL IVPB  Status:  Discontinued       Placed in Followed by Linked Group   500 mg 100 mL/hr over 60 Minutes Intravenous  Once 11/19/23 1922 11/19/23 2353   11/19/23 1930  metroNIDAZOLE  (FLAGYL ) IVPB 500 mg        500 mg 100 mL/hr over 60 Minutes Intravenous  Once 11/19/23 1923 11/19/23 2223   11/19/23 1815  cefTRIAXone (ROCEPHIN) 2 g in sodium chloride  0.9 % 100 mL IVPB        2 g 200 mL/hr over 30 Minutes Intravenous  Once 11/19/23 1807 11/19/23 1929         Assessment/Plan POD 1, s/p dx lap with drainage of abscesses and omental graham patch of ulcer, by Dr. Sheldon 11/9 -continue NPO (hold oral meds for now) and NGT -await bowel function.   -will need UGI around POD 3 -pain control, multi-modal pain control -mobilize and pulm toilet  FEN - NPO/NGT/IVFs per [facility] VTE - Lovenox ID - Maxipime/Flagyl  x 5 days post op    LOS: 3 days    Burnard FORBES Banter , [facility] 11/22/2023, 9:02 AM Please see Amion for pager number during day hours 7:00am-4:30pm or 7:00am -11:30am on weekends

## 2023-11-22 NOTE — Anesthesia Postprocedure Evaluation (Signed)
 Anesthesia Post Note  Patient: Theodore Stevenson  Procedure(s) Performed: LAPAROSCOPY, DIAGNOSTIC (Abdomen)     Patient location during evaluation: PACU Anesthesia Type: General Level of consciousness: awake and alert Pain management: pain level controlled Vital Signs Assessment: post-procedure vital signs reviewed and stable Respiratory status: spontaneous breathing, nonlabored ventilation, respiratory function stable and patient connected to nasal cannula oxygen Cardiovascular status: blood pressure returned to baseline and stable Postop Assessment: no apparent nausea or vomiting Anesthetic complications: no   No notable events documented.  Last Vitals:  Vitals:   11/22/23 1624 11/22/23 1949  BP:  (!) 159/85  Pulse:  86  Resp: 14 17  Temp:  36.6 C  SpO2:  95%    Last Pain:  Vitals:   11/22/23 1949  TempSrc: Oral  PainSc:                  Thom JONELLE Peoples

## 2023-11-22 NOTE — Progress Notes (Signed)
 Patient had incont episode of medium BM this morning, formed with some liquid

## 2023-11-22 NOTE — Progress Notes (Signed)
 Mobility Specialist - Progress Note   11/22/23 1518  Mobility  Activity Ambulated with assistance  Level of Assistance Minimal assist, patient does 75% or more  Assistive Device Front wheel walker  Distance Ambulated (ft) 70 ft  Range of Motion/Exercises Active Assistive  Activity Response Tolerated fair  Mobility visit 1 Mobility  Mobility Specialist Start Time (ACUTE ONLY) 1503  Mobility Specialist Stop Time (ACUTE ONLY) 1518  Mobility Specialist Time Calculation (min) (ACUTE ONLY) 15 min   Pt was found on recliner chair and agreeable to mobilize. Required cues for feet placement to remain within RW during ambulation. Had x1 LOB and grew fatigued with session. At EOS returned to bed with all needs met. Call bell in reach and wife in room.   Erminio Leos,  Mobility Specialist Can be reached via Secure Chat

## 2023-11-23 DIAGNOSIS — N179 Acute kidney failure, unspecified: Secondary | ICD-10-CM | POA: Diagnosis not present

## 2023-11-23 DIAGNOSIS — E872 Acidosis, unspecified: Secondary | ICD-10-CM | POA: Diagnosis not present

## 2023-11-23 DIAGNOSIS — K251 Acute gastric ulcer with perforation: Secondary | ICD-10-CM

## 2023-11-23 DIAGNOSIS — R739 Hyperglycemia, unspecified: Secondary | ICD-10-CM | POA: Diagnosis not present

## 2023-11-23 LAB — COMPREHENSIVE METABOLIC PANEL WITH GFR
ALT: 35 U/L (ref 0–44)
AST: 29 U/L (ref 15–41)
Albumin: 2.3 g/dL — ABNORMAL LOW (ref 3.5–5.0)
Alkaline Phosphatase: 87 U/L (ref 38–126)
Anion gap: 8 (ref 5–15)
BUN: 32 mg/dL — ABNORMAL HIGH (ref 8–23)
CO2: 28 mmol/L (ref 22–32)
Calcium: 7.6 mg/dL — ABNORMAL LOW (ref 8.9–10.3)
Chloride: 104 mmol/L (ref 98–111)
Creatinine, Ser: 1.07 mg/dL (ref 0.61–1.24)
GFR, Estimated: >60 mL/min (ref >60)
Glucose, Bld: 175 mg/dL — ABNORMAL HIGH (ref 70–99)
Potassium: 3.2 mmol/L — ABNORMAL LOW (ref 3.5–5.1)
Sodium: 141 mmol/L (ref 135–145)
Total Bilirubin: 0.3 mg/dL (ref 0.0–1.2)
Total Protein: 4.8 g/dL — ABNORMAL LOW (ref 6.5–8.1)

## 2023-11-23 LAB — CBC
HCT: 28.4 % — ABNORMAL LOW (ref 39.0–52.0)
Hemoglobin: 9 g/dL — ABNORMAL LOW (ref 13.0–17.0)
MCH: 32 pg (ref 26.0–34.0)
MCHC: 31.7 g/dL (ref 30.0–36.0)
MCV: 101.1 fL — ABNORMAL HIGH (ref 80.0–100.0)
Platelets: 153 K/uL (ref 150–400)
RBC: 2.81 MIL/uL — ABNORMAL LOW (ref 4.22–5.81)
RDW: 13.5 % (ref 11.5–15.5)
WBC: 6.1 K/uL (ref 4.0–10.5)
nRBC: 0 % (ref 0.0–0.2)

## 2023-11-23 LAB — GLUCOSE, CAPILLARY
Glucose-Capillary: 115 mg/dL — ABNORMAL HIGH (ref 70–99)
Glucose-Capillary: 116 mg/dL — ABNORMAL HIGH (ref 70–99)
Glucose-Capillary: 117 mg/dL — ABNORMAL HIGH (ref 70–99)
Glucose-Capillary: 124 mg/dL — ABNORMAL HIGH (ref 70–99)
Glucose-Capillary: 139 mg/dL — ABNORMAL HIGH (ref 70–99)
Glucose-Capillary: 165 mg/dL — ABNORMAL HIGH (ref 70–99)
Glucose-Capillary: 171 mg/dL — ABNORMAL HIGH (ref 70–99)

## 2023-11-23 LAB — PHOSPHORUS: Phosphorus: 2.1 mg/dL — ABNORMAL LOW (ref 2.5–4.6)

## 2023-11-23 LAB — MAGNESIUM: Magnesium: 2 mg/dL (ref 1.7–2.4)

## 2023-11-23 MED ORDER — SODIUM CHLORIDE 0.9 % IV SOLN
2.0000 g | Freq: Three times a day (TID) | INTRAVENOUS | Status: AC
  Administered 2023-11-23 – 2023-11-26 (×11): 2 g via INTRAVENOUS
  Filled 2023-11-23 (×11): qty 12.5

## 2023-11-23 MED ORDER — ACETAMINOPHEN 10 MG/ML IV SOLN
1000.0000 mg | Freq: Four times a day (QID) | INTRAVENOUS | Status: AC
  Administered 2023-11-23 – 2023-11-24 (×3): 1000 mg via INTRAVENOUS
  Filled 2023-11-23 (×4): qty 100

## 2023-11-23 NOTE — Evaluation (Signed)
 Occupational Therapy Evaluation Patient Details Name: Theodore Stevenson MRN: [id number] DOB: [birth year] Today's Date: 11/23/2023   History of Present Illness   Patient is a 68 year old male who presented to the ED with c/o severe abdominal pain.  Dx with perforated prepyloric ulcer with abscesses and AKI.  Patient underwent lap procedure with drainage and Graham patch & washout on 11/21/23.  PMHx includes HTN, hepatitis, Hx of smoking, OA, spinal stenosis, left low back pain with sciatica, bilateral THA     Clinical Impressions PTA, patient was living at home with spouse and was grossly independent with self-care.  Acute illness & surgery have impacted patient's strength and activity tolerance and contributing to increased assistance required with ADL tasks.  Discussed rehab options with patient, along with spouse and daughter who were present, and all endorsed patient having prior home health therapy experience and desiring same.  Acute OT will continue to follow patient while in the hospital to address performance deficits described herein to prepare patient for return home with home health OT.  Thank you for allowing us  to participate in the care of this patient.    If plan is discharge home, recommend the following:   A lot of help with walking and/or transfers;A lot of help with bathing/dressing/bathroom;Assistance with cooking/housework;Assist for transportation;Help with stairs or ramp for entrance     Functional Status Assessment   Patient has had a recent decline in their functional status and demonstrates the ability to make significant improvements in function in a reasonable and predictable amount of time.     Equipment Recommendations   Tub/shower bench      Precautions/Restrictions   Precautions Precautions: Other (comment) (NPO; abdominal post-op) Recall of Precautions/Restrictions: Intact Precaution/Restrictions Comments: JP drain; NG tube Restrictions Weight  Bearing Restrictions Per Provider Order: No Other Position/Activity Restrictions: Surgeon ordered OOB 6x / day     Mobility Bed Mobility Overal bed mobility: Needs Assistance Bed Mobility: Supine to Sit Supine to sit: Min assist, HOB elevated, Used rails    Transfers Overall transfer level: Needs assistance Equipment used: Rolling walker (2 wheels) Transfers: Sit to/from Stand Sit to Stand: Contact guard assist, From elevated surface      Balance Overall balance assessment: Needs assistance Sitting-balance support: Single extremity supported, Feet supported Sitting balance-Leahy Scale: Poor   Standing balance support: Bilateral upper extremity supported, Reliant on assistive device for balance, During functional activity Standing balance-Leahy Scale: Poor     ADL either performed or assessed with clinical judgement   ADL Overall ADL's : Needs assistance/impaired Eating/Feeding: NPO;Modified independent Eating/Feeding Details (indicate cue type and reason): Ice chips Grooming: Set up;Sitting   Upper Body Bathing: Bed level;Moderate assistance   Lower Body Bathing: Maximal assistance;Bed level   Upper Body Dressing : Sitting;Minimal assistance   Lower Body Dressing: Maximal assistance;Sit to/from stand   Toilet Transfer: +2 for safety/equipment;Stand-pivot;BSC/3in1;Minimal assistance   Toileting- Clothing Manipulation and Hygiene: +2 for safety/equipment;Sit to/from stand;Minimal assistance   Functional mobility during ADLs: Minimal assistance;+2 for safety/equipment;Rolling walker (2 wheels)       Vision Baseline Vision/History: 0 No visual deficits;1 Wears glasses Ability to See in Adequate Light: 0 Adequate Patient Visual Report: No change from baseline Vision Assessment?: No apparent visual deficits            Pertinent Vitals/Pain Pain Assessment Pain Assessment: No/denies pain     Extremity/Trunk Assessment Upper Extremity Assessment Upper  Extremity Assessment: Right hand dominant;RUE deficits/detail;LUE deficits/detail;Generalized weakness RUE Deficits / Details: Shoulder flexion  AROM 90 deg; gross strength 4-/5 RUE Sensation: WNL RUE Coordination: WNL LUE Deficits / Details: Shoulder flexion AROM 85 deg; gross strength 4-/5 LUE Sensation: decreased light touch LUE Coordination: decreased fine motor   Lower Extremity Assessment Lower Extremity Assessment: Defer to PT evaluation   Cervical / Trunk Assessment Cervical / Trunk Assessment: Kyphotic   Communication Communication Communication: No apparent difficulties   Cognition Arousal: Alert Behavior During Therapy: WFL for tasks assessed/performed, Impulsive (Patient sat w/o giving verbal notice) Cognition: No apparent impairments   Following commands: Intact       General Comments   Patient ambulated ~100' in hallway using RW with recliner following; reported pain in LLE during ambulation           Home Living Family/patient expects to be discharged to:: Private residence Living Arrangements: Spouse/significant other Available Help at Discharge: Family;Available 24 hours/day Home Access: Stairs to enter [employer] of Steps: 2 Entrance Stairs-Rails: None Home Layout: Multi-level;Able to live on main level with bedroom/bathroom;Full bath on main level (3 stories with basement) Alternate Level Stairs-Number of Steps: Flight Bathroom Shower/Tub: [employer]: Standard Bathroom Accessibility: Yes How Accessible: Accessible via walker Home Equipment: Rolling Walker (2 wheels);Cane - single point;BSC/3in1;Adaptive equipment Adaptive Equipment: Reacher;Sock aid Additional Comments: 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 ADLs Comments: Grossly independent; no problem with tub or toilet transfers at baseline.    OT Problem List: Decreased strength;Decreased range of motion;Decreased activity tolerance;Impaired balance (sitting and/or standing);Decreased coordination;Impaired sensation;Impaired UE functional use   OT Treatment/Interventions: Self-care/ADL training;Therapeutic exercise;Neuromuscular education;Energy conservation;Therapeutic activities;Patient/family education      OT Goals(Current goals can be found in the care plan section)   Acute Rehab OT Goals Patient Stated Goal: Return home and address chronic orthopedic problems OT Goal Formulation: With patient/family Time For Goal Achievement: 12/07/23 Potential to Achieve Goals: Good ADL Goals Pt Will Perform Grooming: with set-up;standing (while demonstrating 10 minutes standing activity tolerance) Pt Will Perform Lower Body Dressing: with supervision;with adaptive equipment;sit to/from stand Pt Will Transfer to Toilet: with supervision;regular height toilet;ambulating;grab bars Pt Will Perform Toileting - Clothing Manipulation and hygiene: with supervision;with adaptive equipment;sit to/from stand   OT Frequency:  Min 2X/week       AM-PAC OT 6 Clicks Daily Activity     Outcome Measure Help from another person eating meals?: None Help from another person taking care of personal grooming?: A Little Help from another person toileting, which includes using toliet, bedpan, or urinal?: A Little Help from another person bathing (including washing, rinsing, drying)?: A Lot Help from another person to put on and taking off regular upper body clothing?: A Little Help from another person to put on and taking off regular lower body clothing?: A Lot 6 Click Score: 17   End of Session Equipment Utilized During Treatment: Gait belt;Rolling walker (2 wheels) Nurse Communication:  Mobility status;Other (comment);Patient requests pain meds (Patient c/o discomfort in NG tube)  Activity Tolerance: Patient limited by fatigue Patient left: in chair;with call bell/phone within reach;with chair alarm set;with family/visitor present  OT Visit Diagnosis: Unsteadiness on feet (R26.81);Muscle weakness (generalized) (M62.81);Dizziness and giddiness (R42)                Time: 8954-8862 OT Time  Calculation (min): 52 min Charges:  OT General Charges $OT Visit: 1 Visit OT Evaluation $OT Eval Moderate Complexity: 1 Mod OT Treatments $Therapeutic Activity: 23-37 mins  Knolan Simien B. Kamron Vanwyhe, MS, OTR/L 11/23/2023, 12:19 PM

## 2023-11-23 NOTE — Progress Notes (Signed)
 PHARMACY NOTE:  ANTIMICROBIAL RENAL DOSAGE ADJUSTMENT  Current antimicrobial regimen includes a mismatch between antimicrobial dosage and estimated renal function.  As per policy approved by the Pharmacy & Therapeutics and Medical Executive Committees, the antimicrobial dosage will be adjusted accordingly.  Current antimicrobial dosage: cefepime 2 g IV q24h  Indication: intra-abdominal infection  Renal Function:  Estimated Creatinine Clearance: 63.7 mL/min (by C-G formula based on SCr of 1.07 mg/dL).     Antimicrobial dosage has been changed to:  cefepime 2 g IV q8h   Thank you for allowing pharmacy to be a part of this patient's care.  Stefano MARLA Bologna, PharmD, BCPS Clinical Pharmacist 11/23/2023 10:22 AM

## 2023-11-23 NOTE — Progress Notes (Signed)
 2 Days Post-Op  Subjective: Feeling much better today.  Less distended.  Moving his bowels and passing flatus.  500cc of NGT output yesterday   Objective: Vital signs in last 24 hours: Temp:  [97.9 F (36.6 C)-98.4 F (36.9 C)] 98.4 F (36.9 C) (11/11 0524) Pulse Rate:  [46-86] 46 (11/11 0556) Resp:  [14-17] 16 (11/11 0524) BP: (103-185)/(53-85) 103/53 (11/11 0556) SpO2:  [95 %-99 %] 99 % (11/11 0524) Last BM Date : 11/22/23  Intake/Output from previous day: 11/10 0701 - 11/11 0700 In: 708.8 [I.V.:702.2; IV Piggyback:6.6] Out: 2640 [Urine:2075; Emesis/NG output:500; Drains:65] Intake/Output this shift: No intake/output data recorded.  PE: Gen: NAD Abd: mild distention, but softer than yesterday, appropriately tender, incisions c/d/i.  JP with just serous output, 65cc noted.  NGT with minimal output this am  Lab Results:  Recent Labs    11/22/23 0425 11/23/23 0449  WBC 7.7 6.1  HGB 9.1* 9.0*  HCT 29.2* 28.4*  PLT 174 153   BMET Recent Labs    11/22/23 0425 11/23/23 0449  NA 140 141  K 3.7 3.2*  CL 109 104  CO2 21* 28  GLUCOSE 59* 175*  BUN 53* 32*  CREATININE 1.53* 1.07  CALCIUM 7.8* 7.6*   PT/INR No results for input(s): LABPROT, INR in the last 72 hours. CMP     Component Value Date/Time   NA 141 11/23/2023 0449   K 3.2 (L) 11/23/2023 0449   CL 104 11/23/2023 0449   CO2 28 11/23/2023 0449   GLUCOSE 175 (H) 11/23/2023 0449   BUN 32 (H) 11/23/2023 0449   CREATININE 1.07 11/23/2023 0449   CALCIUM 7.6 (L) 11/23/2023 0449   PROT 4.8 (L) 11/23/2023 0449   ALBUMIN 2.3 (L) 11/23/2023 0449   AST 29 11/23/2023 0449   ALT 35 11/23/2023 0449   ALKPHOS 87 11/23/2023 0449   BILITOT 0.3 11/23/2023 0449   GFRNONAA >60 11/23/2023 0449   GFRAA >60 07/27/2019 0528   Lipase     Component Value Date/Time   LIPASE 11 11/22/2023 0425       Studies/Results: No results found.  Anti-infectives: Anti-infectives (From admission, onward)    Start      Dose/Rate Route Frequency Ordered Stop   11/22/23 0300  ceFEPIme (MAXIPIME) 2 g in sodium chloride  0.9 % 100 mL IVPB        2 g 200 mL/hr over 30 Minutes Intravenous Every 24 hours 11/21/23 1323 11/27/23 0259   11/21/23 2000  metroNIDAZOLE  (FLAGYL ) IVPB 500 mg        500 mg 100 mL/hr over 60 Minutes Intravenous Every 12 hours 11/21/23 1323 11/26/23 1959   11/21/23 0300  ceFEPIme (MAXIPIME) 2 g in sodium chloride  0.9 % 100 mL IVPB  Status:  Discontinued        2 g 200 mL/hr over 30 Minutes Intravenous Every 24 hours 11/20/23 0525 11/21/23 1323   11/20/23 0800  metroNIDAZOLE  (FLAGYL ) IVPB 500 mg  Status:  Discontinued        500 mg 100 mL/hr over 60 Minutes Intravenous Every 12 hours 11/19/23 2351 11/21/23 1323   11/20/23 0100  ceFEPIme (MAXIPIME) 2 g in sodium chloride  0.9 % 100 mL IVPB  Status:  Discontinued        2 g 200 mL/hr over 30 Minutes Intravenous Every 12 hours 11/19/23 2359 11/20/23 0525   11/20/23 0045  vancomycin (VANCOREADY) IVPB 500 mg/100 mL        500 mg 100 mL/hr over  60 Minutes Intravenous  Once 11/19/23 2353 11/20/23 0144   11/19/23 1930  vancomycin (VANCOCIN) IVPB 1000 mg/200 mL premix       Placed in Followed by Linked Group   1,000 mg 200 mL/hr over 60 Minutes Intravenous  Once 11/19/23 1922 11/19/23 2223   11/19/23 1930  vancomycin (VANCOCIN) 500 mg in sodium chloride  0.9 % 100 mL IVPB  Status:  Discontinued       Placed in Followed by Linked Group   500 mg 100 mL/hr over 60 Minutes Intravenous  Once 11/19/23 1922 11/19/23 2353   11/19/23 1930  metroNIDAZOLE  (FLAGYL ) IVPB 500 mg        500 mg 100 mL/hr over 60 Minutes Intravenous  Once 11/19/23 1923 11/19/23 2223   11/19/23 1815  cefTRIAXone (ROCEPHIN) 2 g in sodium chloride  0.9 % 100 mL IVPB        2 g 200 mL/hr over 30 Minutes Intravenous  Once 11/19/23 1807 11/19/23 1929        Assessment/Plan POD 2, s/p dx lap with drainage of abscesses and omental graham patch of ulcer, by Dr. Sheldon  11/9 -continue NPO (hold oral meds for now) and NGT -having bowel function -plan UGI 11/12.  If negative will DC NGT and give CLD -pain control, multi-modal pain control -mobilize and pulm toilet  FEN - NPO/NGT/IVFs per [facility] VTE - Lovenox ID - Maxipime/Flagyl  x 5 days post op    LOS: 4 days    Burnard FORBES Banter , [facility] 11/23/2023, 9:22 AM Please see Amion for pager number during day hours 7:00am-4:30pm or 7:00am -11:30am on weekends

## 2023-11-23 NOTE — Evaluation (Signed)
 Physical Therapy Evaluation Patient Details Name: Theodore Stevenson MRN: [id number] DOB: 05-23-1955 Today's Date: 11/23/2023  History of Present Illness  Patient is a 68 year old male who presented to the ED with c/o severe abdominal pain.  Dx with perforated prepyloric ulcer with abscesses and AKI.  Patient underwent lap procedure with drainage and Graham patch & washout on 11/21/23.  PMHx includes HTN, hepatitis, Hx of smoking, OA, spinal stenosis, left low back pain with sciatica, bilateral THA  Clinical Impression  Pt admitted with above diagnosis.  Pt currently with functional limitations due to the deficits listed below (see PT Problem List). Pt will benefit from acute skilled PT to increase their independence and safety with mobility to allow discharge.       The patient reports having difficulty swallowing after expectorating. Does not have a yaunker available.  Patient should progress to Dc home with family as medical improves. Patient reports is  to have neck surgery coming up soon. Pt admitted with above diagnosis. .  Pt currently with functional limitations due to the deficits listed below (see PT Problem List). Pt will benefit from acute skilled PT to increase their independence and safety with mobility to allow discharge.       If plan is discharge home, recommend the following: A little help with walking and/or transfers;A little help with bathing/dressing/bathroom;Help with stairs or ramp for entrance;Assist for transportation   Can travel by private vehicle        Equipment Recommendations None recommended by PT  Recommendations for Other Services       Functional Status Assessment Patient has had a recent decline in their functional status and demonstrates the ability to make significant improvements in function in a reasonable and predictable amount of time.     Precautions / Restrictions Precautions Precautions: Fall Precaution/Restrictions Comments: JP drain; NG  tube Restrictions Weight Bearing Restrictions Per Provider Order: No      Mobility  Bed Mobility   Bed Mobility: Rolling, Sidelying to Sit Rolling: Mod assist Sidelying to sit: Mod assist       General bed mobility comments: assist to log roll to right with pad, assist to fully sit upright    Transfers Overall transfer level: Needs assistance Equipment used: Rolling walker (2 wheels) Transfers: Sit to/from Stand, Bed to chair/wheelchair/BSC Sit to Stand: Contact guard assist, Min assist   Step pivot transfers: Min assist       General transfer comment: from bed x 2, 5 steps forward then bac to recliner    Ambulation/Gait                  Stairs            Wheelchair Mobility     Tilt Bed    Modified Rankin (Stroke Patients Only)       Balance Overall balance assessment: Needs assistance Sitting-balance support: No upper extremity supported, Feet supported Sitting balance-Leahy Scale: Fair     Standing balance support: Bilateral upper extremity supported, Reliant on assistive device for balance, During functional activity Standing balance-Leahy Scale: Poor                               Pertinent Vitals/Pain Pain Assessment Pain Assessment: Faces Faces Pain Scale: Hurts even more Pain Location: neck( due for surg) Pain Descriptors / Indicators: Aching, Grimacing, Guarding Pain Intervention(s): Monitored during session, Limited activity within patient's tolerance, Repositioned  Home Living Family/patient expects to be discharged to:: Private residence Living Arrangements: Spouse/significant other;Children Available Help at Discharge: Family;Available 24 hours/day Type of Home: House Home Access: Stairs to enter Entrance Stairs-Rails: None     Home Layout: Multi-level;Able to live on main level with bedroom/bathroom;Full bath on main level Home Equipment: Rolling Walker (2 wheels);Cane - single point;BSC/3in1;Adaptive  equipment Additional Comments: 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 ADLs Comments: Grossly independent; no problem with tub or toilet transfers at baseline.     Extremity/Trunk Assessment                Communication   Communication Communication: Impaired Factors Affecting Communication: Reduced clarity of speech    Cognition Arousal: Alert Behavior During Therapy: WFL for tasks assessed/performed, Impulsive                             Following commands: Intact       Cueing       General Comments      Exercises     Assessment/Plan    PT Assessment    PT Problem List         PT Treatment Interventions      PT Goals (Current goals can be found in the Care Plan section)  Acute Rehab PT Goals Patient Stated Goal: get neck surg, home PT Goal Formulation: With patient/family Time For Goal Achievement: 12/07/23 Potential to Achieve Goals: Good    Frequency Min 3X/week     Co-evaluation               AM-PAC PT 6 Clicks Mobility  Outcome Measure Help needed turning from your back to your side while in a flat bed without using bedrails?: A Lot Help needed moving from lying on your back to sitting on the side of a flat bed without using bedrails?: A Lot Help needed moving to and from a bed to a chair (including a wheelchair)?: A Lot Help needed standing up from a chair using your arms (e.g., wheelchair or bedside chair)?: A Lot Help needed to walk in hospital room?: A Lot Help needed climbing 3-5 steps with a railing? : Total 6 Click Score: 11    End of Session Equipment Utilized During Treatment: Gait belt Activity Tolerance: Patient tolerated treatment well Patient left: in chair;with call bell/phone within  reach;with family/visitor present Nurse Communication: Mobility status PT Visit Diagnosis: Other abnormalities of gait and mobility (R26.89);Muscle weakness (generalized) (M62.81);Pain    Time: 8580-8560 PT Time Calculation (min) (ACUTE ONLY): 20 min   Charges:   PT Evaluation $PT Eval Low Complexity: 1 Low   PT General Charges $$ ACUTE PT VISIT: 1 Visit         Darice Potters PT Acute Rehabilitation Services Office [phone]   Potters Darice Norris 11/23/2023, 4:45 PM

## 2023-11-23 NOTE — Progress Notes (Signed)
 PROGRESS NOTE    [name]  FMW:[id number] DOB: 10/22/1955 DOA: 11/19/2023 PCP: Theodore Stevenson, No Pcp Per   Brief Narrative:  Theodore Stevenson is a 68 year old male with HTN, spinal stenosis, chronic back pain presented to ED with severe abdominal pain with concern over perforated gastric ulcer and abscess on imaging status post diagnostic laparoscope with drainage of abscess and omental Arlyss patch of ulcer on 11/21/2023.  Theodore Stevenson continues to improve daily, noted ongoing flatus and bowel movements over the past 24 hours, NG tube remains to suction with moderate output, diet being advanced per general surgery once NG output improves.  Assessment & Plan:   Principal Problem:   Perforated prepyloric ulcer s/p lap omental Arlyss patch/washout 11/21/2023 Active Problems:   Hypertension   AKI (acute kidney injury)   Hyperkalemia   Hyperglycemia   UTI (urinary tract infection)   PUD (peptic ulcer disease)   Perforated abdominal viscus   Acidosis, lactic  Micro pneumoperitoneum, perforated gastric ulcer with abscesses  -status post diagnostic laparoscopy, drainage of abscesses, omental Graham patch of ulcer per general surgery, Dr. Sheldon 11/21/2023.  - NG tube remains in place per general surgery, draining clear bilious fluid - Abdomen moderately distended with ongoing bowel sounds - Currently n.p.o. per general surgery, advance diet per their expertise -Continue prophylactic antibiotics per general surgery x 5 days  -continue cefepime, Flagyl  - [facility] recommending outpatient follow-up after discharge, no intervention required at this time inpatient  AKI with hyperkalemia, lactic acidosis, NAGMA - Renal function approaching baseline, continue to follow clinically - Urine output somewhat down today, discontinue IV fluids with bicarb and transition to LR  - Continue fluids while NG tube remains to suction and Theodore Stevenson remains n.p.o. - Hyperkalemia resolved.   Hyperkalemia -Status post Lokelma  previously. - Potassium borderline low today - follow for 24h - hold supplementation to avoid further hyperK   Hyperglycemia without diagnosis of DM -Hemoglobin A1c noted at 5.3.   UTI ruled out -Urinalysis concerning for UTI. - Urine cultures with <10,000 colonies, frequent growth.  Obesity class II Body mass index is 30.45 kg/m.   DVT prophylaxis: enoxaparin (LOVENOX) injection 40 mg Start: 11/22/23 1000 SCDs Start: 11/19/23 2349 Code Status:   Code Status: Full Code Family Communication: At bedside  Status is: Inpatient  Dispo: The Theodore Stevenson is from: Home              Anticipated d/c is to: Home              Anticipated d/c date is: 48 to 72 hours              Theodore Stevenson currently not medically stable for discharge  Consultants:  General surgery  Procedures:  Diagnostic laparoscopy with drainage of abscess and omental Arlyss patch of ulcer on 11/21/2023  Antimicrobials:  Cefepime, Flagyl   Subjective: No acute issues or events overnight, notes multiple episodes with bowel movements and flatus, denies vomiting diarrhea constipation headache fevers chills or chest pain.  Noted episodes of nausea with cough but denies any emesis  Objective: Vitals:   11/22/23 1624 11/22/23 1949 11/23/23 0524 11/23/23 0556  BP:  (!) 159/85 (!) 185/79 (!) 103/53  Pulse:  86 64 (!) 46  Resp: 14 17 16    Temp:  97.9 F (36.6 C) 98.4 F (36.9 C)   TempSrc:  Oral    SpO2:  95% 99%   Weight:      Height:        Intake/Output Summary (Last 24  hours) at 11/23/2023 0750 Last data filed at 11/23/2023 0528 Gross per 24 hour  Intake 708.77 ml  Output 2640 ml  Net -1931.23 ml   Filed Weights   11/19/23 1614  Weight: 83 kg    Examination:  General:  Pleasantly resting in bed, No acute distress. HEENT:  Normocephalic atraumatic.  Sclerae nonicteric, noninjected.  Extraocular movements intact bilaterally.  NG tube draining clear bilious fluid Neck:  Without mass or deformity.  Trachea is  midline. Lungs:  Clear to auscultate bilaterally without rhonchi, wheeze, or rales. Heart:  Regular rate and rhythm.  Without murmurs, rubs, or gallops. Abdomen:  Soft, nontender, nondistended.  Without guarding or rebound. Extremities: Without cyanosis, clubbing, edema, or obvious deformity. Skin:  Warm and dry, no erythema.  Data Reviewed: I have personally reviewed following labs and imaging studies  CBC: Recent Labs  Lab 11/19/23 1619 11/21/23 0429 11/22/23 0425 11/23/23 0449  WBC 7.9 9.1 7.7 6.1  HGB 12.0* 10.6* 9.1* 9.0*  HCT 36.7* 35.0* 29.2* 28.4*  MCV 98.9 107.4* 102.1* 101.1*  PLT 214 221 174 153   Basic Metabolic Panel: Recent Labs  Lab 11/20/23 0256 11/21/23 0429 11/21/23 1433 11/22/23 0425 11/23/23 0449  NA 140 141 140 140 141  K 4.9 5.3* 4.3 3.7 3.2*  CL 110 114* 112* 109 104  CO2 20* 15* 17* 21* 28  GLUCOSE 125* 88 87 59* 175*  BUN 64* 73* 67* 53* 32*  CREATININE 2.37* 2.67* 2.01* 1.53* 1.07  CALCIUM 8.1* 8.0* 7.5* 7.8* 7.6*  MG  --   --   --  2.0 2.0  PHOS  --   --   --  2.7 2.1*   GFR: Estimated Creatinine Clearance: 63.7 mL/min (by C-G formula based on SCr of 1.07 mg/dL). Liver Function Tests: Recent Labs  Lab 11/19/23 1619 11/20/23 0256 11/21/23 0429 11/22/23 0425 11/23/23 0449  AST 29 25 40 38 29  ALT 58* 46* 51* 44 35  ALKPHOS 59 45 56 68 87  BILITOT 0.3 0.3 0.3 0.3 0.3  PROT 5.6* 5.1* 5.1* 5.2* 4.8*  ALBUMIN 3.4* 2.9* 2.4* 2.7* 2.3*   Recent Labs  Lab 11/19/23 1619 11/22/23 0425  LIPASE 32 11   No results for input(s): AMMONIA in the last 168 hours. Coagulation Profile: No results for input(s): INR, PROTIME in the last 168 hours. Cardiac Enzymes: No results for input(s): CKTOTAL, CKMB, CKMBINDEX, TROPONINI in the last 168 hours. BNP (last 3 results) No results for input(s): PROBNP in the last 8760 hours. HbA1C: No results for input(s): HGBA1C in the last 72 hours. CBG: Recent Labs  Lab 11/22/23 1635  11/22/23 2007 11/23/23 0022 11/23/23 0351 11/23/23 0742  GLUCAP 137* 127* 139* 171* 165*   Lipid Profile: No results for input(s): CHOL, HDL, LDLCALC, TRIG, CHOLHDL, LDLDIRECT in the last 72 hours. Thyroid Function Tests: No results for input(s): TSH, T4TOTAL, FREET4, T3FREE, THYROIDAB in the last 72 hours. Anemia Panel: No results for input(s): VITAMINB12, FOLATE, FERRITIN, TIBC, IRON, RETICCTPCT in the last 72 hours. Sepsis Labs: Recent Labs  Lab 11/19/23 1940 11/19/23 2201 11/20/23 0255 11/21/23 0804  LATICACIDVEN 3.1* 2.5* 2.3* 1.4    Recent Results (from the past 240 hours)  Urine Culture     Status: Abnormal   Collection Time: 11/19/23  6:00 PM   Specimen: Urine, Clean Catch  Result Value Ref Range Status   Specimen Description   Final    URINE, CLEAN CATCH Performed at [facility], [address] [place], [place] [address]    Special Requests   Final    NONE Performed at [facility], [address] [place], [place] [address]    Culture (A)  Final    <10,000 COLONIES/mL INSIGNIFICANT GROWTH Performed at [facility], [address] [place], [place] [address]    Report Status 11/20/2023 FINAL  Final  Blood culture (routine x 2)     Status: None (Preliminary result)   Collection Time: 11/19/23  7:35 PM   Specimen: BLOOD  Result Value Ref Range Status   Specimen Description   Final    BLOOD RIGHT ANTECUBITAL Performed at [facility], [address] [place], [place] [address]    Special Requests   Final    BOTTLES DRAWN AEROBIC AND ANAEROBIC Blood Culture adequate volume Performed at [facility], [address] [place], [place] [address]    Culture   Final    NO GROWTH 3 DAYS Performed at [facility], [address] [place], [place] [address]    Report Status PENDING  Incomplete  Blood culture (routine x 2)     Status: None (Preliminary result)   Collection  Time: 11/19/23  7:45 PM   Specimen: BLOOD  Result Value Ref Range Status   Specimen Description   Final    BLOOD LEFT ANTECUBITAL Performed at [facility], [address] [place], [place] [address]    Special Requests   Final    BOTTLES DRAWN AEROBIC AND ANAEROBIC Blood Culture adequate volume Performed at [facility], [address] [place], [place] [address]    Culture   Final    NO GROWTH 3 DAYS Performed at [facility], [address] [place], [place] [address]    Report Status PENDING  Incomplete         Radiology Studies: No results found.      Scheduled Meds:  enoxaparin (LOVENOX) injection  40 mg Subcutaneous Q24H   insulin aspart  0-6 Units Subcutaneous Q4H   methocarbamol  (ROBAXIN ) injection  500 mg Intravenous Q8H   pantoprazole (PROTONIX) IV  40 mg Intravenous Q12H   sodium chloride  flush  3 mL Intravenous Q12H   Continuous Infusions:  ceFEPime (MAXIPIME) IV 2 g (11/23/23 0341)   lactated ringers      metronidazole  500 mg (11/22/23 2133)   sodium bicarbonate 150 mEq in dextrose  5 % 1,150 mL infusion 125 mL/hr at 11/23/23 0341     LOS: 4 days   Time spent:  Elsie JAYSON Montclair, DO [facility]  If 7PM-7AM, please contact night-coverage [email or web address]  11/23/2023, 7:50 AM

## 2023-11-23 NOTE — Progress Notes (Signed)
   11/23/23 1412  TOC Brief Assessment  Insurance and Status Reviewed  Patient has primary care physician Yes  Home environment has been reviewed home with spouse  Prior level of function: mod indepdnent  Prior/Current Home Services No current home services  Social Drivers of Health Review SDOH reviewed no interventions necessary  Readmission risk has been reviewed Yes  Transition of care needs no transition of care needs at this time

## 2023-11-23 NOTE — Plan of Care (Signed)

## 2023-11-24 ENCOUNTER — Inpatient Hospital Stay (HOSPITAL_COMMUNITY)

## 2023-11-24 DIAGNOSIS — J9601 Acute respiratory failure with hypoxia: Secondary | ICD-10-CM

## 2023-11-24 DIAGNOSIS — N179 Acute kidney failure, unspecified: Secondary | ICD-10-CM | POA: Diagnosis not present

## 2023-11-24 DIAGNOSIS — K251 Acute gastric ulcer with perforation: Secondary | ICD-10-CM | POA: Diagnosis not present

## 2023-11-24 DIAGNOSIS — J9602 Acute respiratory failure with hypercapnia: Secondary | ICD-10-CM

## 2023-11-24 DIAGNOSIS — N3000 Acute cystitis without hematuria: Secondary | ICD-10-CM | POA: Diagnosis not present

## 2023-11-24 DIAGNOSIS — R198 Other specified symptoms and signs involving the digestive system and abdomen: Secondary | ICD-10-CM | POA: Diagnosis not present

## 2023-11-24 DIAGNOSIS — G9341 Metabolic encephalopathy: Secondary | ICD-10-CM

## 2023-11-24 LAB — BLOOD GAS, ARTERIAL
Acid-Base Excess: 22.2 mmol/L — ABNORMAL HIGH (ref 0.0–2.0)
Bicarbonate: 46.2 mmol/L — ABNORMAL HIGH (ref 20.0–28.0)
Delivery systems: POSITIVE
Drawn by: 27027
O2 Content: 3 L/min
O2 Saturation: 99 %
Patient temperature: 37.1
pCO2 arterial: 46 mmHg (ref 32–48)
pH, Arterial: 7.61 (ref 7.35–7.45)
pO2, Arterial: 90 mmHg (ref 83–108)

## 2023-11-24 LAB — CULTURE, BLOOD (ROUTINE X 2)
Culture: NO GROWTH
Culture: NO GROWTH
Special Requests: ADEQUATE
Special Requests: ADEQUATE

## 2023-11-24 LAB — BASIC METABOLIC PANEL WITH GFR
Anion gap: 8 (ref 5–15)
Anion gap: 10 (ref 5–15)
BUN: 26 mg/dL — ABNORMAL HIGH (ref 8–23)
BUN: 23 mg/dL (ref 8–23)
CO2: 34 mmol/L — ABNORMAL HIGH (ref 22–32)
CO2: 33 mmol/L — ABNORMAL HIGH (ref 22–32)
Calcium: 7.7 mg/dL — ABNORMAL LOW (ref 8.9–10.3)
Calcium: 8 mg/dL — ABNORMAL LOW (ref 8.9–10.3)
Chloride: 99 mmol/L (ref 98–111)
Chloride: 98 mmol/L (ref 98–111)
Creatinine, Ser: 0.96 mg/dL (ref 0.61–1.24)
Creatinine, Ser: 1.08 mg/dL (ref 0.61–1.24)
GFR, Estimated: >60 mL/min (ref >60)
GFR, Estimated: >60 mL/min
Glucose, Bld: 126 mg/dL — ABNORMAL HIGH (ref 70–99)
Glucose, Bld: 106 mg/dL — ABNORMAL HIGH (ref 70–99)
Potassium: 3.1 mmol/L — ABNORMAL LOW (ref 3.5–5.1)
Potassium: 4.2 mmol/L (ref 3.5–5.1)
Sodium: 141 mmol/L (ref 135–145)
Sodium: 140 mmol/L (ref 135–145)

## 2023-11-24 LAB — GLUCOSE, CAPILLARY
Glucose-Capillary: 102 mg/dL — ABNORMAL HIGH (ref 70–99)
Glucose-Capillary: 111 mg/dL — ABNORMAL HIGH (ref 70–99)
Glucose-Capillary: 125 mg/dL — ABNORMAL HIGH (ref 70–99)
Glucose-Capillary: 128 mg/dL — ABNORMAL HIGH (ref 70–99)
Glucose-Capillary: 65 mg/dL — ABNORMAL LOW (ref 70–99)
Glucose-Capillary: 73 mg/dL (ref 70–99)
Glucose-Capillary: 80 mg/dL (ref 70–99)
Glucose-Capillary: 88 mg/dL (ref 70–99)

## 2023-11-24 LAB — CBC
HCT: 32.2 % — ABNORMAL LOW (ref 39.0–52.0)
Hemoglobin: 10.2 g/dL — ABNORMAL LOW (ref 13.0–17.0)
MCH: 31.9 pg (ref 26.0–34.0)
MCHC: 31.7 g/dL (ref 30.0–36.0)
MCV: 100.6 fL — ABNORMAL HIGH (ref 80.0–100.0)
Platelets: 166 K/uL (ref 150–400)
RBC: 3.2 MIL/uL — ABNORMAL LOW (ref 4.22–5.81)
RDW: 13.5 % (ref 11.5–15.5)
WBC: 6.7 K/uL (ref 4.0–10.5)
nRBC: 0 % (ref 0.0–0.2)

## 2023-11-24 MED ORDER — FUROSEMIDE 10 MG/ML IJ SOLN
60.0000 mg | Freq: Once | INTRAMUSCULAR | Status: AC
  Administered 2023-11-24: 60 mg via INTRAVENOUS
  Filled 2023-11-24: qty 6

## 2023-11-24 MED ORDER — HYDROMORPHONE HCL 1 MG/ML IJ SOLN: 0.5000 mg | INTRAMUSCULAR | Status: DC | PRN

## 2023-11-24 MED ORDER — DEXTROSE 50 % IV SOLN
12.5000 g | INTRAVENOUS | Status: AC
Start: 2023-11-24 — End: 2023-11-24
  Administered 2023-11-24: 12.5 g via INTRAVENOUS
  Filled 2023-11-24: qty 50

## 2023-11-24 MED ORDER — SODIUM CHLORIDE 0.9 % IV SOLN: INTRAVENOUS | Status: DC

## 2023-11-24 MED ORDER — LACTATED RINGERS IV SOLN: INTRAVENOUS | Status: DC

## 2023-11-24 MED ORDER — POTASSIUM CHLORIDE 10 MEQ/100ML IV SOLN
10.0000 meq | INTRAVENOUS | Status: AC
  Administered 2023-11-24 – 2023-11-25 (×6): 10 meq via INTRAVENOUS
  Filled 2023-11-24 (×6): qty 100

## 2023-11-24 MED ORDER — NALOXONE HCL 0.4 MG/ML IJ SOLN
0.4000 mg | INTRAMUSCULAR | Status: DC | PRN
Start: 2023-11-24 — End: 2023-11-29
  Administered 2023-11-24: 0.4 mg via INTRAVENOUS

## 2023-11-24 MED ORDER — POTASSIUM CHLORIDE 10 MEQ/100ML IV SOLN
10.0000 meq | INTRAVENOUS | Status: AC
  Administered 2023-11-24 (×2): 10 meq via INTRAVENOUS
  Filled 2023-11-24 (×2): qty 100

## 2023-11-24 MED ORDER — IOHEXOL 300 MG/ML  SOLN
200.0000 mL | Freq: Once | INTRAMUSCULAR | Status: AC | PRN
  Administered 2023-11-24: 175 mL

## 2023-11-24 MED ORDER — NALOXONE HCL 0.4 MG/ML IJ SOLN
INTRAMUSCULAR | Status: AC
  Filled 2023-11-24: qty 1

## 2023-11-24 MED ORDER — CHLORHEXIDINE GLUCONATE CLOTH 2 % EX PADS
6.0000 | MEDICATED_PAD | Freq: Every day | CUTANEOUS | Status: DC
  Administered 2023-11-24 – 2023-11-27 (×4): 6 via TOPICAL

## 2023-11-24 NOTE — Progress Notes (Addendum)
 PROGRESS NOTE    METRO EDENFIELD  FMW:[id number] DOB: 25-Nov-1955 DOA: 11/19/2023 PCP: Patient, No Pcp Per   Brief Narrative:  Patient is a 68 year old male with HTN, spinal stenosis, chronic back pain presented to ED with severe abdominal pain with concern over perforated gastric ulcer and abscess on imaging status post diagnostic laparoscope with drainage of abscess and omental Arlyss patch of ulcer on 11/21/2023.  Patient continues to improve daily, noted ongoing flatus and bowel movements over the past 24 hours, NG tube remains to suction with moderate output, diet being advanced per general surgery once NG output improves.  Assessment & Plan:   Principal Problem:   Perforated prepyloric ulcer s/p lap omental Arlyss patch/washout 11/21/2023 Active Problems:   Hypertension   AKI (acute kidney injury)   Hyperkalemia   Hyperglycemia   UTI (urinary tract infection)   PUD (peptic ulcer disease)   Perforated abdominal viscus   Acidosis, lactic  **Rapid response this evening for worsening mental status and pain - Patient received Narcan with minimal improvement in mental status, can follow commands but is poorly verbal, abdomen remains distended without point tenderness, previous imaging today without any notable leak.  In the setting of prolonged IV fluids and IV narcotics presume patient has hypercarbia complicated by body habitus and abdominal distention.  He also appears to have some urinary obstruction likely secondary to intra-abdominal pressure.  Foley will be placed, have ordered Lasix x 1 as well as potassium to supplement given previous hypokalemia this morning.  Patient will also be placed on BiPAP, stat VBG pending to evaluate for hypercarbia.  If CO2 is within normal limits and no improvement on BiPAP will need to reevaluate for possible secondary causes of mental status changes although narcotics is likely playing a large role as well. - NG tube removed this afternoon but patient  too somnolent to take PO safely at this time - Follow urine output, VBG, improvement on bipap  Micro pneumoperitoneum, perforated gastric ulcer with abscesses  -status post diagnostic laparoscopy, drainage of abscesses, omental Arlyss patch of ulcer per general surgery, Dr. Sheldon 11/21/2023.  - NG tube remains in place per general surgery, draining clear bilious fluid - Abdomen remains moderately distended with ongoing bowel sounds - Currently n.p.o. per general surgery, advance diet per their expertise(pending NG tube removal) -Continue prophylactic antibiotics per general surgery x 5 days  -continue cefepime, Flagyl  - [facility] recommending outpatient follow-up after discharge, no intervention required at this time inpatient  AKI with hyperkalemia Nongapped acidosis Concurrent lactic acidosis - Renal function approaching baseline, continue to follow clinically - Urine output improving, transition to LR until p.o. status improves - Continue fluids while NG tube remains to suction and patient remains n.p.o. - Hyperkalemia resolved-mild hypokalemia ongoing as below   Hyperkalemia Subsequent hypokalemia - Status post Lokelma previously. - Potassium remains low, supplement cautiously given prior hyperkalemia   Hyperglycemia without diagnosis of DM -Hemoglobin A1c noted at 5.3.   UTI ruled out - Urinalysis concerning for UTI. - Urine cultures with <10,000 colonies  Obesity class II - Body mass index is 30.45 kg/m.   DVT prophylaxis: enoxaparin (LOVENOX) injection 40 mg Start: 11/22/23 1000 SCDs Start: 11/19/23 2349 Code Status:   Code Status: Full Code Family Communication: At bedside  Status is: Inpatient  Dispo: The patient is from: Home              Anticipated d/c is to: Home  Anticipated d/c date is: 48 to 72 hours              Patient currently not medically stable for discharge  Consultants:  General surgery  Procedures:  Diagnostic laparoscopy with  drainage of abscess and omental Arlyss patch of ulcer on 11/21/2023  Antimicrobials:  Cefepime, Flagyl   Subjective: No acute issues or events overnight, notes multiple episodes with bowel movements and flatus, denies vomiting diarrhea constipation headache fevers chills or chest pain.  Noted episodes of nausea again today without emesis  Objective: Vitals:   11/23/23 1150 11/23/23 1342 11/23/23 2003 11/24/23 0408  BP: (!) 152/73 (!) 153/76 (!) 168/69 (!) 156/71  Pulse: 100 (!) 57 68 63  Resp:  16 16 17   Temp:  (!) 97.3 F (36.3 C) 97.7 F (36.5 C) 97.9 F (36.6 C)  TempSrc:  Oral Oral Axillary  SpO2: 92% 98% 97% 94%  Weight:      Height:        Intake/Output Summary (Last 24 hours) at 11/24/2023 0750 Last data filed at 11/24/2023 0000 Gross per 24 hour  Intake 0 ml  Output 1210 ml  Net -1210 ml   Filed Weights   11/19/23 1614  Weight: 83 kg    Examination:  General:  Pleasantly resting in bed, No acute distress. HEENT:  Normocephalic atraumatic.  Sclerae nonicteric, noninjected.  Extraocular movements intact bilaterally.  NG tube draining clear bilious fluid Neck:  Without mass or deformity.  Trachea is midline. Lungs:  Clear to auscultate bilaterally without rhonchi, wheeze, or rales. Heart:  Regular rate and rhythm.  Without murmurs, rubs, or gallops. Abdomen:  Soft, nontender, nondistended.  Without guarding or rebound. Extremities: Without cyanosis, clubbing, edema, or obvious deformity. Skin:  Warm and dry, no erythema.  Data Reviewed: I have personally reviewed following labs and imaging studies  CBC: Recent Labs  Lab 11/19/23 1619 11/21/23 0429 11/22/23 0425 11/23/23 0449  WBC 7.9 9.1 7.7 6.1  HGB 12.0* 10.6* 9.1* 9.0*  HCT 36.7* 35.0* 29.2* 28.4*  MCV 98.9 107.4* 102.1* 101.1*  PLT 214 221 174 153   Basic Metabolic Panel: Recent Labs  Lab 11/21/23 0429 11/21/23 1433 11/22/23 0425 11/23/23 0449 11/24/23 0417  NA 141 140 140 141 141  K 5.3*  4.3 3.7 3.2* 3.1*  CL 114* 112* 109 104 99  CO2 15* 17* 21* 28 34*  GLUCOSE 88 87 59* 175* 126*  BUN 73* 67* 53* 32* 26*  CREATININE 2.67* 2.01* 1.53* 1.07 0.96  CALCIUM 8.0* 7.5* 7.8* 7.6* 7.7*  MG  --   --  2.0 2.0  --   PHOS  --   --  2.7 2.1*  --    GFR: Estimated Creatinine Clearance: 71 mL/min (by C-G formula based on SCr of 0.96 mg/dL).  Liver Function Tests: Recent Labs  Lab 11/19/23 1619 11/20/23 0256 11/21/23 0429 11/22/23 0425 11/23/23 0449  AST 29 25 40 38 29  ALT 58* 46* 51* 44 35  ALKPHOS 59 45 56 68 87  BILITOT 0.3 0.3 0.3 0.3 0.3  PROT 5.6* 5.1* 5.1* 5.2* 4.8*  ALBUMIN 3.4* 2.9* 2.4* 2.7* 2.3*   Recent Labs  Lab 11/19/23 1619 11/22/23 0425  LIPASE 32 11   CBG: Recent Labs  Lab 11/23/23 1703 11/23/23 1959 11/23/23 2355 11/24/23 0404 11/24/23 0747  GLUCAP 117* 116* 124* 125* 128*   Sepsis Labs: Recent Labs  Lab 11/19/23 1940 11/19/23 2201 11/20/23 0255 11/21/23 0804  LATICACIDVEN 3.1* 2.5* 2.3* 1.4  Recent Results (from the past 240 hours)  Urine Culture     Status: Abnormal   Collection Time: 11/19/23  6:00 PM   Specimen: Urine, Clean Catch  Result Value Ref Range Status   Specimen Description   Final    URINE, CLEAN CATCH Performed at [facility], [address] [place], [place] [address]    Special Requests   Final    NONE Performed at [facility], [address] [place], [place] [address]    Culture (A)  Final    <10,000 COLONIES/mL INSIGNIFICANT GROWTH Performed at [facility], [address] [place], [place] [address]    Report Status 11/20/2023 FINAL  Final  Blood culture (routine x 2)     Status: None (Preliminary result)   Collection Time: 11/19/23  7:35 PM   Specimen: BLOOD  Result Value Ref Range Status   Specimen Description   Final    BLOOD RIGHT ANTECUBITAL Performed at [facility], [address] [place], [place] [address]    Special Requests   Final     BOTTLES DRAWN AEROBIC AND ANAEROBIC Blood Culture adequate volume Performed at [facility], [address] [place], [place] [address]    Culture   Final    NO GROWTH 4 DAYS Performed at [facility], [address] [place], [place] [address]    Report Status PENDING  Incomplete  Blood culture (routine x 2)     Status: None (Preliminary result)   Collection Time: 11/19/23  7:45 PM   Specimen: BLOOD  Result Value Ref Range Status   Specimen Description   Final    BLOOD LEFT ANTECUBITAL Performed at [facility], [address] [place], [place] [address]    Special Requests   Final    BOTTLES DRAWN AEROBIC AND ANAEROBIC Blood Culture adequate volume Performed at [facility], [address] [place], [place] [address]    Culture   Final    NO GROWTH 4 DAYS Performed at [facility], [address], [place], [place] [address]    Report Status PENDING  Incomplete    Radiology Studies: No results found.  Scheduled Meds:  enoxaparin (LOVENOX) injection  40 mg Subcutaneous Q24H   insulin aspart  0-6 Units Subcutaneous Q4H   methocarbamol  (ROBAXIN ) injection  500 mg Intravenous Q8H   pantoprazole (PROTONIX) IV  40 mg Intravenous Q12H   sodium chloride  flush  3 mL Intravenous Q12H   Continuous Infusions:  acetaminophen  1,000 mg (11/24/23 0601)   ceFEPime (MAXIPIME) IV 2 g (11/24/23 0648)   metronidazole  500 mg (11/23/23 2131)   sodium bicarbonate 150 mEq in dextrose  5 % 1,150 mL infusion 125 mL/hr at 11/23/23 1408     LOS: 5 days   Time spent: Additional critical care time spent at bedside 60 minutes.  Elsie JAYSON Montclair, DO [facility]  If 7PM-7AM, please contact night-coverage [email or web address]  11/24/2023, 7:50 AM

## 2023-11-24 NOTE — Plan of Care (Signed)
  Problem: Activity: Goal: Risk for activity intolerance will decrease Outcome: Not Progressing   Problem: Nutrition: Goal: Adequate nutrition will be maintained Outcome: Not Progressing   

## 2023-11-24 NOTE — Progress Notes (Signed)
 Progress Note  3 Days Post-Op  Subjective: Patient denies pain. Having nausea. No vomiting. Having flatulence. No BM. NGT in place and NPO.   ROS  All negative with the exception of above.  Objective: Vital signs in last 24 hours: Temp:  [97.3 F (36.3 C)-97.9 F (36.6 C)] 97.9 F (36.6 C) (11/12 0408) Pulse Rate:  [57-100] 63 (11/12 0408) Resp:  [16-17] 17 (11/12 0408) BP: (152-168)/(69-76) 156/71 (11/12 0408) SpO2:  [92 %-98 %] 94 % (11/12 0408) Last BM Date : 11/22/23  Intake/Output from previous day: 11/11 0701 - 11/12 0700 In: 0  Out: 1210 [Urine:1200; Drains:10] Intake/Output this shift: No intake/output data recorded.  PE: General: Pleasant male who is laying in bed in NAD. HEENT: Head is normocephalic, atraumatic. NGT in place Heart: HR normal during encounter.  Lungs: Respiratory effort nonlabored. Abd: Distention present. NT. Incisions C/D/I. No rebound tenderness or guarding.  Skin: Warm and dry. Psych: A&Ox3 with an appropriate affect.    Lab Results:  Recent Labs    11/22/23 0425 11/23/23 0449  WBC 7.7 6.1  HGB 9.1* 9.0*  HCT 29.2* 28.4*  PLT 174 153   BMET Recent Labs    11/23/23 0449 11/24/23 0417  NA 141 141  K 3.2* 3.1*  CL 104 99  CO2 28 34*  GLUCOSE 175* 126*  BUN 32* 26*  CREATININE 1.07 0.96  CALCIUM 7.6* 7.7*   PT/INR No results for input(s): LABPROT, INR in the last 72 hours. CMP     Component Value Date/Time   NA 141 11/24/2023 0417   K 3.1 (L) 11/24/2023 0417   CL 99 11/24/2023 0417   CO2 34 (H) 11/24/2023 0417   GLUCOSE 126 (H) 11/24/2023 0417   BUN 26 (H) 11/24/2023 0417   CREATININE 0.96 11/24/2023 0417   CALCIUM 7.7 (L) 11/24/2023 0417   PROT 4.8 (L) 11/23/2023 0449   ALBUMIN 2.3 (L) 11/23/2023 0449   AST 29 11/23/2023 0449   ALT 35 11/23/2023 0449   ALKPHOS 87 11/23/2023 0449   BILITOT 0.3 11/23/2023 0449   GFRNONAA >60 11/24/2023 0417   GFRAA >60 07/27/2019 0528   Lipase     Component Value  Date/Time   LIPASE 11 11/22/2023 0425       Studies/Results: No results found.  Anti-infectives: Anti-infectives (From admission, onward)    Start     Dose/Rate Route Frequency Ordered Stop   11/23/23 1400  ceFEPIme (MAXIPIME) 2 g in sodium chloride  0.9 % 100 mL IVPB        2 g 200 mL/hr over 30 Minutes Intravenous Every 8 hours 11/23/23 1021 11/27/23 0559   11/22/23 0300  ceFEPIme (MAXIPIME) 2 g in sodium chloride  0.9 % 100 mL IVPB  Status:  Discontinued        2 g 200 mL/hr over 30 Minutes Intravenous Every 24 hours 11/21/23 1323 11/23/23 1021   11/21/23 2000  metroNIDAZOLE  (FLAGYL ) IVPB 500 mg        500 mg 100 mL/hr over 60 Minutes Intravenous Every 12 hours 11/21/23 1323 11/26/23 1959   11/21/23 0300  ceFEPIme (MAXIPIME) 2 g in sodium chloride  0.9 % 100 mL IVPB  Status:  Discontinued        2 g 200 mL/hr over 30 Minutes Intravenous Every 24 hours 11/20/23 0525 11/21/23 1323   11/20/23 0800  metroNIDAZOLE  (FLAGYL ) IVPB 500 mg  Status:  Discontinued        500 mg 100 mL/hr over 60 Minutes Intravenous Every  12 hours 11/19/23 2351 11/21/23 1323   11/20/23 0100  ceFEPIme (MAXIPIME) 2 g in sodium chloride  0.9 % 100 mL IVPB  Status:  Discontinued        2 g 200 mL/hr over 30 Minutes Intravenous Every 12 hours 11/19/23 2359 11/20/23 0525   11/20/23 0045  vancomycin (VANCOREADY) IVPB 500 mg/100 mL        500 mg 100 mL/hr over 60 Minutes Intravenous  Once 11/19/23 2353 11/20/23 0144   11/19/23 1930  vancomycin (VANCOCIN) IVPB 1000 mg/200 mL premix       Placed in Followed by Linked Group   1,000 mg 200 mL/hr over 60 Minutes Intravenous  Once 11/19/23 1922 11/19/23 2223   11/19/23 1930  vancomycin (VANCOCIN) 500 mg in sodium chloride  0.9 % 100 mL IVPB  Status:  Discontinued       Placed in Followed by Linked Group   500 mg 100 mL/hr over 60 Minutes Intravenous  Once 11/19/23 1922 11/19/23 2353   11/19/23 1930  metroNIDAZOLE  (FLAGYL ) IVPB 500 mg        500 mg 100 mL/hr over  60 Minutes Intravenous  Once 11/19/23 1923 11/19/23 2223   11/19/23 1815  cefTRIAXone (ROCEPHIN) 2 g in sodium chloride  0.9 % 100 mL IVPB        2 g 200 mL/hr over 30 Minutes Intravenous  Once 11/19/23 1807 11/19/23 1929        Assessment/Plan POD 3, s/p dx lap with drainage of abscesses and omental graham patch of ulcer, by Dr. Sheldon 11/9 -Continue NPO (hold oral meds for now) and NGT -UGI completed this morning but report still in progress. If negative will DC NGT and give CLD. -No pain during encounter. Pain control, multi-modal pain control -JP drain serous. 10 mL recorded. -mobilize and pulm toilet   FEN - NPO/NGT/IVFs per [facility] VTE - Lovenox ID - Maxipime/Flagyl  x 5 days post op    LOS: 5 days   I reviewed specialist notes, hospitalist notes, nursing notes, last 24 h vitals and pain scores, last 48 h intake and output, last 24 h labs and trends, and last 24 h imaging results.   Marjorie Carlyon Favre, [facility] 11/24/2023, 10:09 AM Please see Amion for pager number during day hours 7:00am-4:30pm

## 2023-11-24 NOTE — Progress Notes (Signed)
   11/24/23 1935  BiPAP/CPAP/SIPAP  $ Non-Invasive Home Ventilator  Initial  BiPAP/CPAP/SIPAP BIPAP AUTO  Patient Home Machine No  Patient Home Mask No  Patient Home Tubing No  Auto Titrate Yes  CPAP/SIPAP surface wiped down Yes  Device Plugged into RED Power Outlet Yes  BiPAP/CPAP /SiPAP Vitals  Pulse Rate 92  Resp 15  SpO2 96 %  Bilateral Breath Sounds Diminished  MEWS Score/Color  MEWS Score 0  MEWS Score Color Landy

## 2023-11-24 NOTE — Significant Event (Signed)
 Rapid Response Event Note   Reason for Call :  Lethargic.    Initial Focused Assessment:  Patient difficult to arouse, does answer to name. VSS see flow sheet. Only will  answer to his name, does  not know date of birth, does not know when where he is     Interventions:  Initiated Rapid Response Protocol  ABG EKG CBG Narcan 0.4 MG  Dr. Lue at Bedside.  Notify Surgery    Plan of Care:  BiPap Lasix VBG    MD Notified:  Dr Lue  Call Time: 1800 Arrival Time: 1805 End Time: 1845   Heron PARAS Johnn Krasowski, RN

## 2023-11-24 NOTE — Progress Notes (Signed)
 Two RT's attempted ABG x3 without success. MD and rapid RN aware. MD ok with VBG.

## 2023-11-24 NOTE — Progress Notes (Signed)
 Pt placed on auto BIPAP. Tolerating well.  Waiting on VBG results at this time. 3 lpm bleed in oxygen.

## 2023-11-24 NOTE — Progress Notes (Signed)
 Hypoglycemic Event  CBG: 65  Treatment: D50 25 mL (12.5 gm)  Symptoms: Shaky  Follow-up CBG: Time:2132 CBG Result:111  Possible Reasons for Event: Inadequate meal intake  Comments/MD notified:NP Andrez made aware.     Theodore Stevenson C Jamielee Mchale

## 2023-11-24 NOTE — Progress Notes (Signed)
     Patient Name: Theodore Stevenson           DOB: November 27, 1955  MRN: [id number]      Admission Date: 11/19/2023  Attending Provider: Lue Elsie BROCKS, MD  Primary Diagnosis: Perforated gastric ulcer (HCC)   Level of care: Telemetry   OVERNIGHT EVENT   HPI/ Events of Note  GENERAL WEARING, 68 y.o. male, was admitted on 11/19/2023 for Perforated gastric ulcer (HCC).   Patient admitted for perforated prepyloric ulcer with abscess s/p lap omental Arlyss patch/washout 11/21/2023.  11/12-rapid response in the setting of worsening mental status and pain.  Blood gas, Foley, Lasix, BiPAP ordered by attending. ABG interpreted as metabolic alkalosis (pH 7.61, HCO?? 46, bicarb 46.2 ).  Alkalosis could be multifactorial--- related to recent use of NG tube suction, recent sodium bicarbonate infusion, diuretic use, hypokalemia.  Patient is currently alert to self and follows some simple commands.  Answers some yes or no questions.  Daughter at bedside says he is a little more responsive when he was at 6 PM.   Plan: Sodium bicarbonate infusion was discontinued earlier today Hold further loop diuretic Switch from LR to normal saline Replace potassium Monitor serial ABG and BMP for improvement in alkalosis and electrolytes Minimize sedating agents   Lavanda Horns, DNP, ACNPC- AG [facility] [place]

## 2023-11-25 DIAGNOSIS — N179 Acute kidney failure, unspecified: Secondary | ICD-10-CM | POA: Diagnosis not present

## 2023-11-25 DIAGNOSIS — E875 Hyperkalemia: Secondary | ICD-10-CM | POA: Diagnosis not present

## 2023-11-25 DIAGNOSIS — E872 Acidosis, unspecified: Secondary | ICD-10-CM | POA: Diagnosis not present

## 2023-11-25 DIAGNOSIS — R739 Hyperglycemia, unspecified: Secondary | ICD-10-CM | POA: Diagnosis not present

## 2023-11-25 LAB — COMPREHENSIVE METABOLIC PANEL WITH GFR
ALT: 105 U/L — ABNORMAL HIGH (ref 0–44)
AST: 141 U/L — ABNORMAL HIGH (ref 15–41)
Albumin: 2.4 g/dL — ABNORMAL LOW (ref 3.5–5.0)
Alkaline Phosphatase: 386 U/L — ABNORMAL HIGH (ref 38–126)
Anion gap: 9 (ref 5–15)
BUN: 25 mg/dL — ABNORMAL HIGH (ref 8–23)
CO2: 33 mmol/L — ABNORMAL HIGH (ref 22–32)
Calcium: 8.3 mg/dL — ABNORMAL LOW (ref 8.9–10.3)
Chloride: 99 mmol/L (ref 98–111)
Creatinine, Ser: 1.29 mg/dL — ABNORMAL HIGH (ref 0.61–1.24)
GFR, Estimated: 60 mL/min — ABNORMAL LOW (ref >60)
Glucose, Bld: 100 mg/dL — ABNORMAL HIGH (ref 70–99)
Potassium: 3.5 mmol/L (ref 3.5–5.1)
Sodium: 140 mmol/L (ref 135–145)
Total Bilirubin: 0.5 mg/dL (ref 0.0–1.2)
Total Protein: 5.2 g/dL — ABNORMAL LOW (ref 6.5–8.1)

## 2023-11-25 LAB — BASIC METABOLIC PANEL WITH GFR
Anion gap: 7 (ref 5–15)
BUN: 23 mg/dL (ref 8–23)
CO2: 35 mmol/L — ABNORMAL HIGH (ref 22–32)
Calcium: 8.1 mg/dL — ABNORMAL LOW (ref 8.9–10.3)
Chloride: 99 mmol/L (ref 98–111)
Creatinine, Ser: 1.17 mg/dL (ref 0.61–1.24)
GFR, Estimated: >60 mL/min (ref >60)
Glucose, Bld: 81 mg/dL (ref 70–99)
Potassium: 4 mmol/L (ref 3.5–5.1)
Sodium: 141 mmol/L (ref 135–145)

## 2023-11-25 LAB — AMMONIA: Ammonia: 17 umol/L (ref 9–35)

## 2023-11-25 LAB — GLUCOSE, CAPILLARY
Glucose-Capillary: 81 mg/dL (ref 70–99)
Glucose-Capillary: 81 mg/dL (ref 70–99)
Glucose-Capillary: 77 mg/dL (ref 70–99)
Glucose-Capillary: 87 mg/dL (ref 70–99)
Glucose-Capillary: 119 mg/dL — ABNORMAL HIGH (ref 70–99)
Glucose-Capillary: 96 mg/dL (ref 70–99)

## 2023-11-25 LAB — BLOOD GAS, ARTERIAL
Acid-Base Excess: 17 mmol/L — ABNORMAL HIGH (ref 0.0–2.0)
Bicarbonate: 41.2 mmol/L — ABNORMAL HIGH (ref 20.0–28.0)
Drawn by: 27027
O2 Content: 2 L/min
O2 Saturation: 99.4 %
Patient temperature: 37.1
pCO2 arterial: 45 mmHg (ref 32–48)
pH, Arterial: 7.57 — ABNORMAL HIGH (ref 7.35–7.45)
pO2, Arterial: 120 mmHg — ABNORMAL HIGH (ref 83–108)

## 2023-11-25 MED ORDER — FUROSEMIDE 10 MG/ML IJ SOLN
60.0000 mg | Freq: Once | INTRAMUSCULAR | Status: AC
  Administered 2023-11-25: 60 mg via INTRAVENOUS
  Filled 2023-11-25: qty 6

## 2023-11-25 MED ORDER — ACETAMINOPHEN 325 MG PO TABS
650.0000 mg | ORAL_TABLET | Freq: Four times a day (QID) | ORAL | Status: DC | PRN
  Administered 2023-11-25 – 2023-11-28 (×6): 650 mg via ORAL
  Filled 2023-11-25 (×7): qty 2

## 2023-11-25 NOTE — Progress Notes (Addendum)
 PROGRESS NOTE    Theodore Stevenson  FMW:[id number] DOB: 07/19/1955 DOA: 11/19/2023 PCP: Patient, No Pcp Per   Brief Narrative:  Patient is a 68 year old male with HTN, spinal stenosis, chronic back pain presented to ED with severe abdominal pain with concern over perforated gastric ulcer and abscess on imaging status post diagnostic laparoscope with drainage of abscess and omental Arlyss patch of ulcer on 11/21/2023.  Patient continues to improve daily, noted ongoing flatus and bowel movements over the past 24 hours, NG tube remains to suction with moderate output, diet being advanced per general surgery once NG output improves.  Assessment & Plan:   Principal Problem:   Perforated prepyloric ulcer s/p lap omental Arlyss patch/washout 11/21/2023 Active Problems:   Hypertension   AKI (acute kidney injury)   Hyperkalemia   Hyperglycemia   UTI (urinary tract infection)   PUD (peptic ulcer disease)   Perforated abdominal viscus   Acidosis, lactic  Acute metabolic encephalopathy  Rule out polypharmacy Metabolic alkalosis, iatrogenic Hypercarbia ruled out - Patient notably altered over the past 24 hours thought to initially be due to narcotics/polypharmacy - Bicarb drip held on 11/12 -hold further IV fluids given volume overload status and ongoing diuretics as below - Decreased narcotic burden, Narcan given 11/12 p.m. with moderate improvement in symptoms - Patient alert able to follow commands but somnolent - Initially thought to be possible hypercarbia in the setting of narcotics, CO2 on ABG within normal limits(although after BiPAP had been placed) but appears to be maintaining within normal limits with ongoing mental status changes  Micro pneumoperitoneum, perforated gastric ulcer with abscesses  -status post diagnostic laparoscopy, drainage of abscesses, omental Arlyss patch of ulcer per general surgery, Dr. Sheldon 11/21/2023.  - NG tube remains in place per general surgery, draining  clear bilious fluid - Abdomen remains moderately distended with ongoing bowel sounds - Advance diet per general surgery, NG tube removed 11/13 -currently tolerating full liquid diet - Continue prophylactic antibiotics per general surgery x 5 days  -continue cefepime, Flagyl  - [facility] recommending outpatient follow-up after discharge, no intervention required at this time inpatient  AKI with hyperkalemia Nongapped acidosis, resolved, currently alkalotic as above Concurrent lactic acidosis, resolved - Renal function approaching baseline, continue to follow clinically - Urine output improving, transition to LR until p.o. status improves - Continue fluids while NG tube remains to suction and patient remains n.p.o. - Hyperkalemia resolved-mild hypokalemia ongoing as below   Subsequent hypokalemia Hyperkalemia, resolved - Status post Lokelma previously. - Potassium remains low, supplement cautiously given prior hyperkalemia   Hyperglycemia without diagnosis of DM - Hemoglobin A1c noted at 5.3.   UTI ruled out - Urinalysis concerning for UTI. - Urine cultures with <10,000 colonies  Obesity class II - Body mass index is 30.45 kg/m.  DVT prophylaxis: enoxaparin (LOVENOX) injection 40 mg Start: 11/22/23 1000 SCDs Start: 11/19/23 2349 Code Status:   Code Status: Full Code Family Communication: At bedside  Status is: Inpatient  Dispo: The patient is from: Home              Anticipated d/c is to: Home              Anticipated d/c date is: 72+ hours              Patient currently not medically stable for discharge  Consultants:  General surgery  Procedures:  Diagnostic laparoscopy with drainage of abscess and omental Arlyss patch of ulcer on 11/21/2023  Antimicrobials:  Cefepime, Flagyl   Subjective: Noted Response yesterday evening, see documentation, patient's respiratory status appears to be improving, mental status improving somewhat but not yet back to baseline.  Patient  continues to report left leg pain and generalized abdominal pain but no new symptoms or changes.  He remains somnolent but arousable.  Objective: Vitals:   11/24/23 1327 11/24/23 1935 11/24/23 2008 11/25/23 0610  BP: (!) 154/67  (!) 128/50 (!) 163/70  Pulse: 71 92 81 86  Resp: 16 15 18 20   Temp: 98.3 F (36.8 C)  99.3 F (37.4 C) 98.1 F (36.7 C)  TempSrc: Oral  Axillary Oral  SpO2: 97% 96% 100% 99%  Weight:      Height:        Intake/Output Summary (Last 24 hours) at 11/25/2023 0809 Last data filed at 11/25/2023 9377 Gross per 24 hour  Intake 1286.27 ml  Output 4380 ml  Net -3093.73 ml   Filed Weights   11/19/23 1614  Weight: 83 kg    Examination:  General: Somnolent but arousable uncomfortable appearing in bedside chair, not in any acute distress HEENT:  Normocephalic atraumatic.  Sclerae nonicteric, noninjected.  Extraocular movements intact bilaterally.  NG tube draining clear bilious fluid Neck:  Without mass or deformity.  Trachea is midline. Lungs:  Clear to auscultate bilaterally without rhonchi, wheeze, or rales. Heart:  Regular rate and rhythm.  Without murmurs, rubs, or gallops. Abdomen: Distended somewhat tympanic, diffusely tender; drain without noted purulence or bleeding - without guarding or rebound. Extremities: Bilateral lower extremity edema to the thigh, 2+ pitting. Skin:  Warm and dry, no erythema.  Data Reviewed: I have personally reviewed following labs and imaging studies  CBC: Recent Labs  Lab 11/19/23 1619 11/21/23 0429 11/22/23 0425 11/23/23 0449 11/24/23 1003  WBC 7.9 9.1 7.7 6.1 6.7  HGB 12.0* 10.6* 9.1* 9.0* 10.2*  HCT 36.7* 35.0* 29.2* 28.4* 32.2*  MCV 98.9 107.4* 102.1* 101.1* 100.6*  PLT 214 221 174 153 166   Basic Metabolic Panel: Recent Labs  Lab 11/22/23 0425 11/23/23 0449 11/24/23 0417 11/24/23 2125 11/25/23 0425  NA 140 141 141 140 141  K 3.7 3.2* 3.1* 4.2 4.0  CL 109 104 99 98 99  CO2 21* 28 34* 33* 35*   GLUCOSE 59* 175* 126* 106* 81  BUN 53* 32* 26* 23 23  CREATININE 1.53* 1.07 0.96 1.08 1.17  CALCIUM 7.8* 7.6* 7.7* 8.0* 8.1*  MG 2.0 2.0  --   --   --   PHOS 2.7 2.1*  --   --   --    GFR: Estimated Creatinine Clearance: 58.3 mL/min (by C-G formula based on SCr of 1.17 mg/dL).  Liver Function Tests: Recent Labs  Lab 11/19/23 1619 11/20/23 0256 11/21/23 0429 11/22/23 0425 11/23/23 0449  AST 29 25 40 38 29  ALT 58* 46* 51* 44 35  ALKPHOS 59 45 56 68 87  BILITOT 0.3 0.3 0.3 0.3 0.3  PROT 5.6* 5.1* 5.1* 5.2* 4.8*  ALBUMIN 3.4* 2.9* 2.4* 2.7* 2.3*   Recent Labs  Lab 11/19/23 1619 11/22/23 0425  LIPASE 32 11   CBG: Recent Labs  Lab 11/24/23 2132 11/24/23 2358 11/25/23 0201 11/25/23 0430 11/25/23 0751  GLUCAP 111* 88 81 81 77   Sepsis Labs: Recent Labs  Lab 11/19/23 1940 11/19/23 2201 11/20/23 0255 11/21/23 0804  LATICACIDVEN 3.1* 2.5* 2.3* 1.4    Recent Results (from the past 240 hours)  Urine Culture     Status: Abnormal   Collection Time: 11/19/23  6:00 PM   Specimen: Urine, Clean Catch  Result Value Ref Range Status   Specimen Description   Final    URINE, CLEAN CATCH Performed at [facility], [address] [place], [place] [address]    Special Requests   Final    NONE Performed at [facility], [address] [place], [place] [address]    Culture (A)  Final    <10,000 COLONIES/mL INSIGNIFICANT GROWTH Performed at [facility], [address] [place], [place] [address]    Report Status 11/20/2023 FINAL  Final  Blood culture (routine x 2)     Status: None   Collection Time: 11/19/23  7:35 PM   Specimen: BLOOD  Result Value Ref Range Status   Specimen Description   Final    BLOOD RIGHT ANTECUBITAL Performed at [facility], [address] [place], [place] [address]    Special Requests   Final    BOTTLES DRAWN AEROBIC AND ANAEROBIC Blood Culture adequate volume Performed at [facility],  [address] [place], [place] [address]    Culture   Final    NO GROWTH 5 DAYS Performed at [facility], [address] [place], [place] [address]    Report Status 11/24/2023 FINAL  Final  Blood culture (routine x 2)     Status: None   Collection Time: 11/19/23  7:45 PM   Specimen: BLOOD  Result Value Ref Range Status   Specimen Description   Final    BLOOD LEFT ANTECUBITAL Performed at [facility], [address] [place], [place] [address]    Special Requests   Final    BOTTLES DRAWN AEROBIC AND ANAEROBIC Blood Culture adequate volume Performed at [facility], [address] [place], [place] [address]    Culture   Final    NO GROWTH 5 DAYS Performed at [facility], [address], [place], [place] [address]    Report Status 11/24/2023 FINAL  Final    Radiology Studies: DG UGI W SINGLE CM (SOL OR THIN BA) Result Date: 11/24/2023 CLINICAL DATA:  Postop from [facility] patch repair of perforated gastric ulcer. Evaluate for postop leak. EXAM: WATER SOLUBLE UPPER GI SERIES WITH KUB TECHNIQUE: Single-column upper GI series was performed using 200 mL water soluble Omnipaque  300 contrast. Radiation Exposure Index (as provided by the fluoroscopic device): 65.2 mGy Kerma COMPARISON:  None Available. FINDINGS: Scout radiograph shows a nasogastric tube, with tip in the gastric antrum. Right abdominal surgical drain also seen. Bowel gas pattern is normal. Single contrast upper GI series was performed during administration water-soluble contrast through the patient's existing nasogastric tube. There is no evidence of contrast leak or extravasation from the stomach or duodenum. Wall irregularity and luminal narrowing is seen involving the distal gastric antrum and duodenum bulb, most likely due to postop edema at site of ulcer repair. No evidence of gastric outlet or duodenum obstruction. IMPRESSION: No evidence of postop contrast leak or obstruction. Luminal  narrowing and wall irregularity of the distal gastric antrum and duodenum bulb, most likely due to postop edema. Electronically Signed   By: Norleen DELENA Kil M.D.   On: 11/24/2023 11:45    Scheduled Meds:  Chlorhexidine  Gluconate Cloth  6 each Topical Daily   enoxaparin (LOVENOX) injection  40 mg Subcutaneous Q24H   insulin aspart  0-6 Units Subcutaneous Q4H   methocarbamol  (ROBAXIN ) injection  500 mg Intravenous Q8H   pantoprazole (PROTONIX) IV  40 mg Intravenous Q12H   sodium chloride  flush  3 mL Intravenous Q12H   Continuous Infusions:  sodium chloride  100 mL/hr at 11/24/23 2134   ceFEPime (MAXIPIME) IV 2 g (11/25/23 0622)   metronidazole  500 mg (11/24/23 2136)     LOS: 6 days   Time spent:  Elsie JAYSON Montclair, DO [facility]  If 7PM-7AM, please contact night-coverage [email or web address]  11/25/2023, 8:09 AM

## 2023-11-25 NOTE — Progress Notes (Signed)
 Progress Note  4 Days Post-Op  Subjective: Patient denies pain. No nausea nor vomiting. Having flatulence. Tolerating clear liquids without any reported issues.  ROS  All negative with the exception of above.  Objective: Vital signs in last 24 hours: Temp:  [98.1 F (36.7 C)-99.3 F (37.4 C)] 98.1 F (36.7 C) (11/13 0610) Pulse Rate:  [70-92] 86 (11/13 0610) Resp:  [15-20] 20 (11/13 0610) BP: (128-163)/(50-80) 163/70 (11/13 0610) SpO2:  [96 %-100 %] 99 % (11/13 0610) Last BM Date : 11/21/23  Intake/Output from previous day: 11/12 0701 - 11/13 0700 In: 1286.3 [P.O.:300; I.V.:86.3; IV Piggyback:900] Out: 4380 [Urine:4250; Drains:130] Intake/Output this shift: No intake/output data recorded.  PE: General: Pleasant male who is laying in bed in NAD. HEENT: Head is normocephalic, atraumatic. NGT in place Heart: HR normal during encounter.  Lungs: Respiratory effort nonlabored. Abd: Distention present. NT. Incisions C/D/I. No rebound tenderness or guarding.  JP remains thin serous effluent - 20 cc overnight Skin: Warm and dry. Psych: A&Ox3 with an appropriate affect.    Lab Results:  Recent Labs    11/23/23 0449 11/24/23 1003  WBC 6.1 6.7  HGB 9.0* 10.2*  HCT 28.4* 32.2*  PLT 153 166   BMET Recent Labs    11/24/23 2125 11/25/23 0425  NA 140 141  K 4.2 4.0  CL 98 99  CO2 33* 35*  GLUCOSE 106* 81  BUN 23 23  CREATININE 1.08 1.17  CALCIUM 8.0* 8.1*   PT/INR No results for input(s): LABPROT, INR in the last 72 hours. CMP     Component Value Date/Time   NA 141 11/25/2023 0425   K 4.0 11/25/2023 0425   CL 99 11/25/2023 0425   CO2 35 (H) 11/25/2023 0425   GLUCOSE 81 11/25/2023 0425   BUN 23 11/25/2023 0425   CREATININE 1.17 11/25/2023 0425   CALCIUM 8.1 (L) 11/25/2023 0425   PROT 4.8 (L) 11/23/2023 0449   ALBUMIN 2.3 (L) 11/23/2023 0449   AST 29 11/23/2023 0449   ALT 35 11/23/2023 0449   ALKPHOS 87 11/23/2023 0449   BILITOT 0.3 11/23/2023 0449    GFRNONAA >60 11/25/2023 0425   GFRAA >60 07/27/2019 0528   Lipase     Component Value Date/Time   LIPASE 11 11/22/2023 0425       Studies/Results: DG UGI W SINGLE CM (SOL OR THIN BA) Result Date: 11/24/2023 CLINICAL DATA:  Postop from [facility] patch repair of perforated gastric ulcer. Evaluate for postop leak. EXAM: WATER SOLUBLE UPPER GI SERIES WITH KUB TECHNIQUE: Single-column upper GI series was performed using 200 mL water soluble Omnipaque  300 contrast. Radiation Exposure Index (as provided by the fluoroscopic device): 65.2 mGy Kerma COMPARISON:  None Available. FINDINGS: Scout radiograph shows a nasogastric tube, with tip in the gastric antrum. Right abdominal surgical drain also seen. Bowel gas pattern is normal. Single contrast upper GI series was performed during administration water-soluble contrast through the patient's existing nasogastric tube. There is no evidence of contrast leak or extravasation from the stomach or duodenum. Wall irregularity and luminal narrowing is seen involving the distal gastric antrum and duodenum bulb, most likely due to postop edema at site of ulcer repair. No evidence of gastric outlet or duodenum obstruction. IMPRESSION: No evidence of postop contrast leak or obstruction. Luminal narrowing and wall irregularity of the distal gastric antrum and duodenum bulb, most likely due to postop edema. Electronically Signed   By: Norleen DELENA Kil M.D.   On: 11/24/2023 11:45  Anti-infectives: Anti-infectives (From admission, onward)    Start     Dose/Rate Route Frequency Ordered Stop   11/23/23 1400  ceFEPIme (MAXIPIME) 2 g in sodium chloride  0.9 % 100 mL IVPB        2 g 200 mL/hr over 30 Minutes Intravenous Every 8 hours 11/23/23 1021 11/27/23 0559   11/22/23 0300  ceFEPIme (MAXIPIME) 2 g in sodium chloride  0.9 % 100 mL IVPB  Status:  Discontinued        2 g 200 mL/hr over 30 Minutes Intravenous Every 24 hours 11/21/23 1323 11/23/23 1021   11/21/23 2000   metroNIDAZOLE  (FLAGYL ) IVPB 500 mg        500 mg 100 mL/hr over 60 Minutes Intravenous Every 12 hours 11/21/23 1323 11/26/23 1959   11/21/23 0300  ceFEPIme (MAXIPIME) 2 g in sodium chloride  0.9 % 100 mL IVPB  Status:  Discontinued        2 g 200 mL/hr over 30 Minutes Intravenous Every 24 hours 11/20/23 0525 11/21/23 1323   11/20/23 0800  metroNIDAZOLE  (FLAGYL ) IVPB 500 mg  Status:  Discontinued        500 mg 100 mL/hr over 60 Minutes Intravenous Every 12 hours 11/19/23 2351 11/21/23 1323   11/20/23 0100  ceFEPIme (MAXIPIME) 2 g in sodium chloride  0.9 % 100 mL IVPB  Status:  Discontinued        2 g 200 mL/hr over 30 Minutes Intravenous Every 12 hours 11/19/23 2359 11/20/23 0525   11/20/23 0045  vancomycin (VANCOREADY) IVPB 500 mg/100 mL        500 mg 100 mL/hr over 60 Minutes Intravenous  Once 11/19/23 2353 11/20/23 0144   11/19/23 1930  vancomycin (VANCOCIN) IVPB 1000 mg/200 mL premix       Placed in Followed by Linked Group   1,000 mg 200 mL/hr over 60 Minutes Intravenous  Once 11/19/23 1922 11/19/23 2223   11/19/23 1930  vancomycin (VANCOCIN) 500 mg in sodium chloride  0.9 % 100 mL IVPB  Status:  Discontinued       Placed in Followed by Linked Group   500 mg 100 mL/hr over 60 Minutes Intravenous  Once 11/19/23 1922 11/19/23 2353   11/19/23 1930  metroNIDAZOLE  (FLAGYL ) IVPB 500 mg        500 mg 100 mL/hr over 60 Minutes Intravenous  Once 11/19/23 1923 11/19/23 2223   11/19/23 1815  cefTRIAXone (ROCEPHIN) 2 g in sodium chloride  0.9 % 100 mL IVPB        2 g 200 mL/hr over 30 Minutes Intravenous  Once 11/19/23 1807 11/19/23 1929        Assessment/Plan POD 3, s/p dx lap with drainage of abscesses and omental graham patch of ulcer, by Dr. Sheldon 11/9 -Continue NPO (hold oral meds for now) and NGT -UGI negative. NG out 11/12 and CLD started  -No pain during encounter. Pain control, multi-modal pain control -JP drain serous. Continue for now, likely remove prior to d/c - Adv to  full liquids as tolerated -mobilize and pulm toilet   FEN - NPO/NGT/IVFs per TRH VTE - Lovenox ID - Maxipime/Flagyl  x 5 days post op    LOS: 6 days   I reviewed specialist notes, hospitalist notes, nursing notes, last 24 h vitals and pain scores, last 48 h intake and output, last 24 h labs and trends, and last 24 h imaging results.   Lonni CHRISTELLA Pizza, MD  [facility] 11/25/2023, 9:25 AM Please see Amion for pager number  during day hours 7:00am-4:30pm

## 2023-11-25 NOTE — TOC Initial Note (Signed)
 Transition of Care [facility]) - Initial/Assessment Note    Patient Details  Name: Theodore Stevenson MRN: [id number] Date of Birth: October 25, 1955  [facility] CM/SW Contact:    Tawni CHRISTELLA Eva, LCSW Phone Number: 11/25/2023, 3:32 PM  Clinical Narrative:                   CSW met with the pt and family at the bedside. Pt was unable to respond. CSW discussed recommendations for SNF placement with the pt's spouse, Theodore Stevenson. Theodore Stevenson declined SNF placement and would like to take the pt home with home health services. Pt's wife requested [facility] as the home health agency. She reported that prior to this hospitalization, the pt was independent at baseline. She stated that the pt has a walker and a cane at home. Pt's spouse will provide transportation home upon discharge.  Care management to follow.  Expected Discharge Plan: Home w Home Health Services    Patient Goals and CMS Choice Patient states their goals for this hospitalization and ongoing recovery are:: retun home          Expected Discharge Plan and Services       Living arrangements for the past 2 months: Single Family Home                                      Prior Living Arrangements/Services Living arrangements for the past 2 months: Single Family Home Lives with:: Self, Spouse          Need for Family Participation in Patient Care: Yes (Comment) Care giver support system in place?: Yes (comment) Current home services: DME    Activities of Daily Living   ADL Screening (condition at time of admission) Independently performs ADLs?: Yes (appropriate for developmental age) Is the patient deaf or have difficulty hearing?: No Does the patient have difficulty seeing, even when wearing glasses/contacts?: No Does the patient have difficulty concentrating, remembering, or making decisions?: No  Permission Sought/Granted                  Emotional Assessment Appearance:: Appears older than  stated age Attitude/Demeanor/Rapport: Unable to Assess Affect (typically observed): Unable to Assess     Psych Involvement: No (comment)  Admission diagnosis:  Small bowel perforation (HCC) [K63.1] AKI (acute kidney injury) [N17.9] Perforated abdominal viscus [R19.8] Patient Active Problem List   Diagnosis Date Noted   Perforated abdominal viscus 11/22/2023   Acidosis, lactic 11/22/2023   Perforated prepyloric ulcer s/p lap omental Arlyss patch/washout 11/21/2023 11/21/2023   UTI (urinary tract infection) 11/20/2023   PUD (peptic ulcer disease) 11/20/2023   AKI (acute kidney injury) 11/19/2023   Hyperkalemia 11/19/2023   Hyperglycemia 11/19/2023   History of left hip replacement 02/13/2021   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   Colitis 07/24/2019   Chronic left-sided low back pain with left-sided sciatica 02/14/2018   Avascular necrosis of hip (HCC) 07/02/2011   Hypertension 07/02/2011   Hepatitis 07/02/2011   Postoperative anemia due to acute blood loss 07/02/2011   PCP:  Patient, No Pcp Per Pharmacy:   [id number] [place], [place] - [address] AT [facility] [address] [place] [address] Phone: [phone] Fax: [phone]     Social Drivers of Health (SDOH) Social History: SDOH Screenings   Food Insecurity: No Food Insecurity (11/19/2023)  Housing:  Low Risk  (11/19/2023)  Transportation Needs: No Transportation Needs (11/19/2023)  Utilities: Not At Risk (11/19/2023)  Social Connections: Moderately Integrated (11/19/2023)  Tobacco Use: High Risk (11/21/2023)   SDOH Interventions:     Readmission Risk Interventions     No data to display

## 2023-11-25 NOTE — Progress Notes (Signed)
 Physical Therapy Treatment Patient Details Name: Theodore Stevenson MRN: [id number] DOB: 12-Apr-1955 Today's Date: 11/25/2023   History of Present Illness Patient is a 68 year old male who presented to the ED with c/o severe abdominal pain.  Dx with perforated prepyloric ulcer with abscesses and AKI.  Patient underwent lap procedure with drainage and Graham patch & washout on 11/21/23.  PMHx includes HTN, hepatitis, Hx of smoking, OA, spinal stenosis, left low back pain with sciatica, bilateral THA    PT Comments  Pt with increased lethargy and impulsivity during session requiring increased assist with mobility. +2 provided for safety and some physical assist throughout. Pt performed bed mobility with MOD A+2  and sit to stands with MIN A+1-2 people. Pt impulsively rising from EOB and performing step pivot transfer to recliner with MIN A. Pt spouse and daughter present throughout and encouraged for delirium precautions and lights on and pt up OOB at end of session with family present. Pt will benefit from continued skilled PT to increase their independence and maximize safety with mobility.     If plan is discharge home, recommend the following: A little help with walking and/or transfers;A lot of help with bathing/dressing/bathroom;Help with stairs or ramp for entrance;Assistance with cooking/housework;Assist for transportation   Can travel by private vehicle     No  Equipment Recommendations  Rolling walker (2 wheels)    Recommendations for Other Services       Precautions / Restrictions Precautions Precautions: Fall Recall of Precautions/Restrictions: Impaired Precaution/Restrictions Comments: JP drain Restrictions Weight Bearing Restrictions Per Provider Order: No     Mobility  Bed Mobility Overal bed mobility: Needs Assistance Bed Mobility: Supine to Sit     Supine to sit: Mod assist, HOB elevated, Used rails, +2 for physical assistance, +2 for safety/equipment     General  bed mobility comments: increased time, pt able to scoot to EOB with increased time. Requires multimodal cuing to prevent sliding too far forward- family reports pt sits at end of recilner at home to assist with finding comfortable spot due to L LE pain. Cold wash cloth provided and applied to pt face to wash/ for increased alertness.Pt opening eyes intermittenlty during session.    Transfers Overall transfer level: Needs assistance Equipment used: Rolling walker (2 wheels) Transfers: Sit to/from Stand, Bed to chair/wheelchair/BSC Sit to Stand: Min assist, +2 physical assistance, +2 safety/equipment   Step pivot transfers: Min assist, +2 safety/equipment       General transfer comment: Initially MIN A+2 for power up to stand from EOB - Pt self initiaitng sit to stands from EOB and progressed to MIN A +1  able to perform following sit to stands from EOB with MIN A+1 and pt impulsively performing step pivot transfer over to recliner when visualized with MIN A+1 and MIN A for sitting fully into recliner seat.    Ambulation/Gait                   Stairs             Wheelchair Mobility     Tilt Bed    Modified Rankin (Stroke Patients Only)       Balance Overall balance assessment: Needs assistance Sitting-balance support: Feet supported, Single extremity supported Sitting balance-Leahy Scale: Fair     Standing balance support: Bilateral upper extremity supported, Reliant on assistive device for balance, During functional activity Standing balance-Leahy Scale: Poor  Communication Communication Communication: Impaired Factors Affecting Communication: Reduced clarity of speech  Cognition Arousal: Lethargic Behavior During Therapy: Impulsive                           PT - Cognition Comments: Difficult to assess orientation and cognition due to lethargy during session and pt with limited verbal responses to  therapist/family during session. Pt impulsive and not very vocal, but attempting to self initiate movement throughout session. +2 provided for safety. Following commands: Impaired Following commands impaired: Follows one step commands inconsistently    Cueing Cueing Techniques: Verbal cues, Gestural cues, Tactile cues, Visual cues  Exercises      General Comments General comments (skin integrity, edema, etc.): Pt's wife and daughter present throughout session. PT in room for extended period following transfer due to pt restlessness and  attempting to find comfortable position in chair sliding hips backward, forward, attempted LEs up/down, pt self initiating hip lifts from chair to find comfort, performing tricep dip and anterior/posterior scooting in recliner, trialed pillows in different postiions for comfort. Pt with some relief with LEs reclined and pillowed placed to allow for figure 4 type position with L knee abd hip flexed and rotated out. Provided chair for daughter to sit close to pt and educated on recliner features which pt demonstrated understanding and ability to safely manage LEs up/down and adjust head. Family educated on call don't fall policy and RN aware.      Pertinent Vitals/Pain Pain Assessment Pain Assessment: Faces Faces Pain Scale: Hurts even more Pain Location: L calf region (baseline discomfort from family report) Pain Descriptors / Indicators: Grimacing, Guarding Pain Intervention(s): Limited activity within patient's tolerance, Monitored during session, Repositioned, Other (comment) (RN notified that family inquiring about tylenol )    Home Living                          Prior Function            PT Goals (current goals can now be found in the care plan section) Acute Rehab PT Goals Patient Stated Goal: get neck surg, home PT Goal Formulation: With patient/family Time For Goal Achievement: 12/07/23 Potential to Achieve Goals: Good Progress  towards PT goals: Progressing toward goals    Frequency    Min 3X/week      PT Plan      Co-evaluation              AM-PAC PT 6 Clicks Mobility   Outcome Measure  Help needed turning from your back to your side while in a flat bed without using bedrails?: A Lot Help needed moving from lying on your back to sitting on the side of a flat bed without using bedrails?: A Lot Help needed moving to and from a bed to a chair (including a wheelchair)?: A Lot Help needed standing up from a chair using your arms (e.g., wheelchair or bedside chair)?: Total Help needed to walk in hospital room?: A Lot Help needed climbing 3-5 steps with a railing? : A Lot 6 Click Score: 11    End of Session Equipment Utilized During Treatment: Gait belt Activity Tolerance: Patient tolerated treatment well Patient left: in chair;with call bell/phone within reach;with chair alarm set;with family/visitor present Nurse Communication: Mobility status;Other (comment) (family request pain meds, noted scrotal edema (RN already aware)) PT Visit Diagnosis: Other abnormalities of gait and mobility (R26.89);Muscle weakness (generalized) (M62.81);Pain Pain -  Right/Left: Left Pain - part of body: Leg     Time: 9065-8982 PT Time Calculation (min) (ACUTE ONLY): 43 min  Charges:    $Therapeutic Activity: 38-52 mins PT General Charges $$ ACUTE PT VISIT: 1 Visit                     Tinnie BERRY PT, DPT  Acute Rehabilitation Services  Office [phone]  11/25/2023, 12:14 PM

## 2023-11-26 ENCOUNTER — Encounter (HOSPITAL_COMMUNITY): Payer: Self-pay | Admitting: Family Medicine

## 2023-11-26 ENCOUNTER — Inpatient Hospital Stay (HOSPITAL_COMMUNITY)

## 2023-11-26 DIAGNOSIS — R739 Hyperglycemia, unspecified: Secondary | ICD-10-CM | POA: Diagnosis not present

## 2023-11-26 DIAGNOSIS — K251 Acute gastric ulcer with perforation: Secondary | ICD-10-CM | POA: Diagnosis not present

## 2023-11-26 DIAGNOSIS — E872 Acidosis, unspecified: Secondary | ICD-10-CM | POA: Diagnosis not present

## 2023-11-26 DIAGNOSIS — N179 Acute kidney failure, unspecified: Secondary | ICD-10-CM | POA: Diagnosis not present

## 2023-11-26 LAB — GLUCOSE, CAPILLARY
Glucose-Capillary: 102 mg/dL — ABNORMAL HIGH (ref 70–99)
Glucose-Capillary: 104 mg/dL — ABNORMAL HIGH (ref 70–99)
Glucose-Capillary: 109 mg/dL — ABNORMAL HIGH (ref 70–99)
Glucose-Capillary: 86 mg/dL (ref 70–99)
Glucose-Capillary: 90 mg/dL (ref 70–99)
Glucose-Capillary: 97 mg/dL (ref 70–99)

## 2023-11-26 LAB — BASIC METABOLIC PANEL WITH GFR
Anion gap: 8 (ref 5–15)
BUN: 26 mg/dL — ABNORMAL HIGH (ref 8–23)
CO2: 33 mmol/L — ABNORMAL HIGH (ref 22–32)
Calcium: 8.2 mg/dL — ABNORMAL LOW (ref 8.9–10.3)
Chloride: 99 mmol/L (ref 98–111)
Creatinine, Ser: 1.29 mg/dL — ABNORMAL HIGH (ref 0.61–1.24)
GFR, Estimated: 60 mL/min — ABNORMAL LOW (ref >60)
Glucose, Bld: 90 mg/dL (ref 70–99)
Potassium: 3.3 mmol/L — ABNORMAL LOW (ref 3.5–5.1)
Sodium: 141 mmol/L (ref 135–145)

## 2023-11-26 LAB — CBC
HCT: 29.8 % — ABNORMAL LOW (ref 39.0–52.0)
Hemoglobin: 9.6 g/dL — ABNORMAL LOW (ref 13.0–17.0)
MCH: 31.9 pg (ref 26.0–34.0)
MCHC: 32.2 g/dL (ref 30.0–36.0)
MCV: 99 fL (ref 80.0–100.0)
Platelets: 229 K/uL (ref 150–400)
RBC: 3.01 MIL/uL — ABNORMAL LOW (ref 4.22–5.81)
RDW: 13.8 % (ref 11.5–15.5)
WBC: 9.7 K/uL (ref 4.0–10.5)
nRBC: 0.2 % (ref 0.0–0.2)

## 2023-11-26 MED ORDER — IOHEXOL 9 MG/ML PO SOLN: 500.0000 mL | ORAL | Status: DC

## 2023-11-26 MED ORDER — IOHEXOL 9 MG/ML PO SOLN
500.0000 mL | ORAL | Status: AC
Start: 2023-11-26 — End: 2023-11-26
  Administered 2023-11-26: 500 mL via ORAL

## 2023-11-26 MED ORDER — ACETAMINOPHEN 10 MG/ML IV SOLN
1000.0000 mg | Freq: Once | INTRAVENOUS | Status: AC
  Administered 2023-11-26: 1000 mg via INTRAVENOUS
  Filled 2023-11-26: qty 100

## 2023-11-26 MED ORDER — IOHEXOL 300 MG/ML  SOLN
100.0000 mL | Freq: Once | INTRAMUSCULAR | Status: AC | PRN
  Administered 2023-11-26: 100 mL via INTRAVENOUS

## 2023-11-26 MED ORDER — SODIUM CHLORIDE (PF) 0.9 % IJ SOLN
INTRAMUSCULAR | Status: AC
  Filled 2023-11-26: qty 50

## 2023-11-26 MED ORDER — TRAMADOL HCL 50 MG PO TABS
25.0000 mg | ORAL_TABLET | Freq: Two times a day (BID) | ORAL | Status: DC | PRN
  Administered 2023-11-26 – 2023-11-28 (×3): 50 mg via ORAL
  Filled 2023-11-26 (×3): qty 1

## 2023-11-26 MED ORDER — PANTOPRAZOLE SODIUM 40 MG IV SOLR
40.0000 mg | INTRAVENOUS | Status: DC
  Administered 2023-11-26 – 2023-11-28 (×3): 40 mg via INTRAVENOUS
  Filled 2023-11-26 (×3): qty 10

## 2023-11-26 MED ORDER — IOHEXOL 9 MG/ML PO SOLN
ORAL | Status: AC
  Filled 2023-11-26: qty 500

## 2023-11-26 MED ORDER — POTASSIUM CHLORIDE CRYS ER 20 MEQ PO TBCR
40.0000 meq | EXTENDED_RELEASE_TABLET | Freq: Once | ORAL | Status: AC
  Administered 2023-11-26: 40 meq via ORAL
  Filled 2023-11-26: qty 2

## 2023-11-26 NOTE — Progress Notes (Signed)
 PROGRESS NOTE    Theodore Stevenson  FMW:[id number] DOB: 16-Mar-1955 DOA: 11/19/2023 PCP: Patient, No Pcp Per   Brief Narrative:  Patient is a 68 year old male with HTN, spinal stenosis, chronic back pain presented to ED with severe abdominal pain with concern over perforated gastric ulcer and abscess on imaging status post diagnostic laparoscope with drainage of abscess and omental Arlyss patch of ulcer on 11/21/2023.  Patient continues to improve daily, noted ongoing flatus and bowel movements over the past 24 hours, NG tube remains to suction with moderate output, diet being advanced per general surgery once NG output improves.  Assessment & Plan:   Principal Problem:   Perforated prepyloric ulcer s/p lap omental Arlyss patch/washout 11/21/2023 Active Problems:   Hypertension   AKI (acute kidney injury)   Hyperkalemia   Hyperglycemia   UTI (urinary tract infection)   PUD (peptic ulcer disease)   Perforated abdominal viscus   Acidosis, lactic    Acute metabolic encephalopathy, improving Polypharmacy unlikely Metabolic alkalosis, iatrogenic Hypercarbia ruled out - Patient notably altered on 11/12 -slowly improving over the past 48 hours - CT head - 11/26/2023 for any acute changes/etiology for confusion - Continues to improve slowly, continue to work daily with therapy - Narcotics previously decreased, no utilization now in over 24 hours, unlikely to be etiology for prior mental status changes - Initially thought to be possible hypercarbia in the setting of narcotics, CO2 on ABG within normal limits(although after BiPAP had been placed) but appears to be maintaining within normal limits with ongoing mental status changes  Micro pneumoperitoneum, perforated gastric ulcer with abscesses  -status post diagnostic laparoscopy, drainage of abscesses, omental Arlyss patch of ulcer per general surgery, Dr. Sheldon 11/21/2023.  - NG tube previously removed 11/13, tolerating p.o. more  appropriately over the past 24 hours - Abdomen remains moderately distended with ongoing bowel sounds - Continue prophylactic antibiotics per general surgery x 5 days  -continue cefepime, Flagyl  - [facility] recommending outpatient follow-up after discharge, no intervention required at this time inpatient  AKI with hyperkalemia, stable Nongapped acidosis, resolved, currently alkalotic as above Concurrent lactic acidosis, resolved - Renal function approaching baseline, continue to follow clinically - Urine output improving, transition to LR until p.o. status improves - Continue fluids while NG tube remains to suction and patient remains n.p.o. - Hyperkalemia resolved-mild hypokalemia ongoing as below   Subsequent hypokalemia Hyperkalemia, resolved -now borderline hypokalemia - Status post Lokelma previously. - Potassium remains low, supplement cautiously given prior hyperkalemia   Hyperglycemia without diagnosis of DM - Hemoglobin A1c noted at 5.3.   UTI ruled out - Urinalysis concerning for UTI. - Urine cultures with <10,000 colonies  Obesity class II - Body mass index is 30.45 kg/m.  DVT prophylaxis: enoxaparin (LOVENOX) injection 40 mg Start: 11/22/23 1000 SCDs Start: 11/19/23 2349 Code Status:   Code Status: Full Code Family Communication: At bedside  Status is: Inpatient  Dispo: The patient is from: Home              Anticipated d/c is to: Home              Anticipated d/c date is: 72+ hours              Patient currently not medically stable for discharge  Consultants:  General surgery  Procedures:  Diagnostic laparoscopy with drainage of abscess and omental Arlyss patch of ulcer on 11/21/2023  Antimicrobials:  Cefepime, Flagyl   Subjective: No acute issues or events overnight, mental status  appears to be improving slowly, not yet back to baseline but able to communicate more appropriately today, tolerating p.o. quite well without dysphagia or choking.  Appetite  slowly returning.  Family updated at bedside, CT head as above unremarkable.  Objective: Vitals:   11/25/23 0610 11/25/23 1248 11/25/23 1957 11/26/23 0637  BP: (!) 163/70 (!) 164/67 (!) 148/69 (!) 143/67  Pulse: 86 74 66 66  Resp: 20 18 16 18   Temp: 98.1 F (36.7 C) 98.5 F (36.9 C) 98.2 F (36.8 C) 97.7 F (36.5 C)  TempSrc: Oral Oral Oral Oral  SpO2: 99% 95% 96% 96%  Weight:      Height:        Intake/Output Summary (Last 24 hours) at 11/26/2023 0802 Last data filed at 11/26/2023 9350 Gross per 24 hour  Intake 1045.49 ml  Output 2935 ml  Net -1889.51 ml   Filed Weights   11/19/23 1614  Weight: 83 kg    Examination:  General: Comfortably resting in bed, remains somnolent arousable but minimally interactive, able to follow commands but confused with complex conversation or actions HEENT:  Normocephalic atraumatic.  Sclerae nonicteric, noninjected.  Extraocular movements intact bilaterally.  NG tube draining clear bilious fluid Neck:  Without mass or deformity.  Trachea is midline. Lungs:  Clear to auscultate bilaterally without rhonchi, wheeze, or rales. Heart:  Regular rate and rhythm.  Without murmurs, rubs, or gallops. Abdomen: Distended somewhat, diffusely tender; drain without noted purulence or bleeding - without guarding or rebound. Extremities: Without pitting edema Skin:  Warm and dry, no erythema.  Data Reviewed: I have personally reviewed following labs and imaging studies  CBC: Recent Labs  Lab 11/21/23 0429 11/22/23 0425 11/23/23 0449 11/24/23 1003 11/26/23 0415  WBC 9.1 7.7 6.1 6.7 9.7  HGB 10.6* 9.1* 9.0* 10.2* 9.6*  HCT 35.0* 29.2* 28.4* 32.2* 29.8*  MCV 107.4* 102.1* 101.1* 100.6* 99.0  PLT 221 174 153 166 229   Basic Metabolic Panel: Recent Labs  Lab 11/22/23 0425 11/23/23 0449 11/24/23 0417 11/24/23 2125 11/25/23 0425 11/25/23 1650 11/26/23 0415  NA 140 141 141 140 141 140 141  K 3.7 3.2* 3.1* 4.2 4.0 3.5 3.3*  CL 109 104 99 98  99 99 99  CO2 21* 28 34* 33* 35* 33* 33*  GLUCOSE 59* 175* 126* 106* 81 100* 90  BUN 53* 32* 26* 23 23 25* 26*  CREATININE 1.53* 1.07 0.96 1.08 1.17 1.29* 1.29*  CALCIUM 7.8* 7.6* 7.7* 8.0* 8.1* 8.3* 8.2*  MG 2.0 2.0  --   --   --   --   --   PHOS 2.7 2.1*  --   --   --   --   --    GFR: Estimated Creatinine Clearance: 52.8 mL/min (A) (by C-G formula based on SCr of 1.29 mg/dL (H)).  Liver Function Tests: Recent Labs  Lab 11/20/23 0256 11/21/23 0429 11/22/23 0425 11/23/23 0449 11/25/23 1650  AST 25 40 38 29 141*  ALT 46* 51* 44 35 105*  ALKPHOS 45 56 68 87 386*  BILITOT 0.3 0.3 0.3 0.3 0.5  PROT 5.1* 5.1* 5.2* 4.8* 5.2*  ALBUMIN 2.9* 2.4* 2.7* 2.3* 2.4*   Recent Labs  Lab 11/19/23 1619 11/22/23 0425  LIPASE 32 11   CBG: Recent Labs  Lab 11/25/23 1133 11/25/23 1640 11/25/23 1953 11/26/23 0007 11/26/23 0414  GLUCAP 87 119* 96 109* 90   Sepsis Labs: Recent Labs  Lab 11/19/23 1940 11/19/23 2201 11/20/23 0255 11/21/23 0804  LATICACIDVEN 3.1* 2.5* 2.3* 1.4    Recent Results (from the past 240 hours)  Urine Culture     Status: Abnormal   Collection Time: 11/19/23  6:00 PM   Specimen: Urine, Clean Catch  Result Value Ref Range Status   Specimen Description   Final    URINE, CLEAN CATCH Performed at [facility], [address] [place], [place] [address]    Special Requests   Final    NONE Performed at [facility], [address] [place], [place] [address]    Culture (A)  Final    <10,000 COLONIES/mL INSIGNIFICANT GROWTH Performed at [facility], [address] [place], [place] [address]    Report Status 11/20/2023 FINAL  Final  Blood culture (routine x 2)     Status: None   Collection Time: 11/19/23  7:35 PM   Specimen: BLOOD  Result Value Ref Range Status   Specimen Description   Final    BLOOD RIGHT ANTECUBITAL Performed at [facility], [address] [place], [place] [address]    Special Requests    Final    BOTTLES DRAWN AEROBIC AND ANAEROBIC Blood Culture adequate volume Performed at [facility], [address] [place], [place] [address]    Culture   Final    NO GROWTH 5 DAYS Performed at [facility], [address], [place], [place] [address]    Report Status 11/24/2023 FINAL  Final  Blood culture (routine x 2)     Status: None   Collection Time: 11/19/23  7:45 PM   Specimen: BLOOD  Result Value Ref Range Status   Specimen Description   Final    BLOOD LEFT ANTECUBITAL Performed at [facility], [address] [place], [place] [address]    Special Requests   Final    BOTTLES DRAWN AEROBIC AND ANAEROBIC Blood Culture adequate volume Performed at [facility], [address] [place], [place] [address]    Culture   Final    NO GROWTH 5 DAYS Performed at [facility], [address] [place], [place] [address]    Report Status 11/24/2023 FINAL  Final    Radiology Studies: DG UGI W SINGLE CM (SOL OR THIN BA) Result Date: 11/24/2023 CLINICAL DATA:  Postop from [facility] patch repair of perforated gastric ulcer. Evaluate for postop leak. EXAM: WATER SOLUBLE UPPER GI SERIES WITH KUB TECHNIQUE: Single-column upper GI series was performed using 200 mL water soluble Omnipaque  300 contrast. Radiation Exposure Index (as provided by the fluoroscopic device): 65.2 mGy Kerma COMPARISON:  None Available. FINDINGS: Scout radiograph shows a nasogastric tube, with tip in the gastric antrum. Right abdominal surgical drain also seen. Bowel gas pattern is normal. Single contrast upper GI series was performed during administration water-soluble contrast through the patient's existing nasogastric tube. There is no evidence of contrast leak or extravasation from the stomach or duodenum. Wall irregularity and luminal narrowing is seen involving the distal gastric antrum and duodenum bulb, most likely due to postop edema at site of ulcer repair. No evidence of  gastric outlet or duodenum obstruction. IMPRESSION: No evidence of postop contrast leak or obstruction. Luminal narrowing and wall irregularity of the distal gastric antrum and duodenum bulb, most likely due to postop edema. Electronically Signed   By: Norleen DELENA Kil M.D.   On: 11/24/2023 11:45    Scheduled Meds:  Chlorhexidine  Gluconate  Cloth  6 each Topical Daily   enoxaparin (LOVENOX) injection  40 mg Subcutaneous Q24H   insulin aspart  0-6 Units Subcutaneous Q4H   methocarbamol  (ROBAXIN ) injection  500 mg Intravenous Q8H   pantoprazole (PROTONIX) IV  40 mg Intravenous Q12H   sodium chloride  flush  3 mL Intravenous Q12H   Continuous Infusions:  ceFEPime (MAXIPIME) IV 2 g (11/26/23 0632)   metronidazole  500 mg (11/25/23 2104)     LOS: 7 days   Time spent:  Elsie JAYSON Montclair, DO [facility]  If 7PM-7AM, please contact night-coverage [email or web address]  11/26/2023, 8:02 AM

## 2023-11-26 NOTE — Progress Notes (Signed)
 Progress Note  5 Days Post-Op  Subjective: Patient denies pain. No nausea nor vomiting. Having flatulence. Tolerating solids without any reported issues.  ROS  All negative with the exception of above.  Objective: Vital signs in last 24 hours: Temp:  [97.7 F (36.5 C)-98.5 F (36.9 C)] 97.7 F (36.5 C) (11/14 0637) Pulse Rate:  [66-74] 66 (11/14 0637) Resp:  [16-18] 18 (11/14 0637) BP: (143-164)/(67-69) 143/67 (11/14 0637) SpO2:  [95 %-96 %] 96 % (11/14 0637) Last BM Date : 11/26/23  Intake/Output from previous day: 11/13 0701 - 11/14 0700 In: 1045.5 [P.O.:390; IV Piggyback:655.5] Out: 2935 [Urine:2900; Drains:35] Intake/Output this shift: No intake/output data recorded.  PE: General: Pleasant male who is laying in bed in NAD. HEENT: Head is normocephalic, atraumatic. NGT in place Heart: HR normal during encounter.  Lungs: Respiratory effort nonlabored. Abd: No significant distention present. NT. Incisions C/D/I. No rebound tenderness or guarding.  JP remains thin serous effluent - 35 cc / 24 hr Skin: Warm and dry. Psych: A&Ox3 with an appropriate affect.    Lab Results:  Recent Labs    11/24/23 1003 11/26/23 0415  WBC 6.7 9.7  HGB 10.2* 9.6*  HCT 32.2* 29.8*  PLT 166 229   BMET Recent Labs    11/25/23 1650 11/26/23 0415  NA 140 141  K 3.5 3.3*  CL 99 99  CO2 33* 33*  GLUCOSE 100* 90  BUN 25* 26*  CREATININE 1.29* 1.29*  CALCIUM 8.3* 8.2*   PT/INR No results for input(s): LABPROT, INR in the last 72 hours. CMP     Component Value Date/Time   NA 141 11/26/2023 0415   K 3.3 (L) 11/26/2023 0415   CL 99 11/26/2023 0415   CO2 33 (H) 11/26/2023 0415   GLUCOSE 90 11/26/2023 0415   BUN 26 (H) 11/26/2023 0415   CREATININE 1.29 (H) 11/26/2023 0415   CALCIUM 8.2 (L) 11/26/2023 0415   PROT 5.2 (L) 11/25/2023 1650   ALBUMIN 2.4 (L) 11/25/2023 1650   AST 141 (H) 11/25/2023 1650   ALT 105 (H) 11/25/2023 1650   ALKPHOS 386 (H) 11/25/2023 1650    BILITOT 0.5 11/25/2023 1650   GFRNONAA 60 (L) 11/26/2023 0415   GFRAA >60 07/27/2019 0528   Lipase     Component Value Date/Time   LIPASE 11 11/22/2023 0425       Studies/Results: No results found.   Anti-infectives: Anti-infectives (From admission, onward)    Start     Dose/Rate Route Frequency Ordered Stop   11/23/23 1400  ceFEPIme (MAXIPIME) 2 g in sodium chloride  0.9 % 100 mL IVPB        2 g 200 mL/hr over 30 Minutes Intravenous Every 8 hours 11/23/23 1021 11/27/23 0559   11/22/23 0300  ceFEPIme (MAXIPIME) 2 g in sodium chloride  0.9 % 100 mL IVPB  Status:  Discontinued        2 g 200 mL/hr over 30 Minutes Intravenous Every 24 hours 11/21/23 1323 11/23/23 1021   11/21/23 2000  metroNIDAZOLE  (FLAGYL ) IVPB 500 mg        500 mg 100 mL/hr over 60 Minutes Intravenous Every 12 hours 11/21/23 1323 11/26/23 1959   11/21/23 0300  ceFEPIme (MAXIPIME) 2 g in sodium chloride  0.9 % 100 mL IVPB  Status:  Discontinued        2 g 200 mL/hr over 30 Minutes Intravenous Every 24 hours 11/20/23 0525 11/21/23 1323   11/20/23 0800  metroNIDAZOLE  (FLAGYL ) IVPB 500 mg  Status:  Discontinued        500 mg 100 mL/hr over 60 Minutes Intravenous Every 12 hours 11/19/23 2351 11/21/23 1323   11/20/23 0100  ceFEPIme (MAXIPIME) 2 g in sodium chloride  0.9 % 100 mL IVPB  Status:  Discontinued        2 g 200 mL/hr over 30 Minutes Intravenous Every 12 hours 11/19/23 2359 11/20/23 0525   11/20/23 0045  vancomycin (VANCOREADY) IVPB 500 mg/100 mL        500 mg 100 mL/hr over 60 Minutes Intravenous  Once 11/19/23 2353 11/20/23 0144   11/19/23 1930  vancomycin (VANCOCIN) IVPB 1000 mg/200 mL premix       Placed in Followed by Linked Group   1,000 mg 200 mL/hr over 60 Minutes Intravenous  Once 11/19/23 1922 11/19/23 2223   11/19/23 1930  vancomycin (VANCOCIN) 500 mg in sodium chloride  0.9 % 100 mL IVPB  Status:  Discontinued       Placed in Followed by Linked Group   500 mg 100 mL/hr over 60 Minutes  Intravenous  Once 11/19/23 1922 11/19/23 2353   11/19/23 1930  metroNIDAZOLE  (FLAGYL ) IVPB 500 mg        500 mg 100 mL/hr over 60 Minutes Intravenous  Once 11/19/23 1923 11/19/23 2223   11/19/23 1815  cefTRIAXone (ROCEPHIN) 2 g in sodium chloride  0.9 % 100 mL IVPB        2 g 200 mL/hr over 30 Minutes Intravenous  Once 11/19/23 1807 11/19/23 1929        Assessment/Plan POD 4, s/p dx lap with drainage of abscesses and omental graham patch of ulcer, by Dr. Sheldon 11/9 -Continue NPO (hold oral meds for now) and NGT -UGI negative. NG out 11/12 and CLD started. Afebrile, normal wbc. Reassurring abdominal exam. Drain thin serous  -No pain during encounter. Pain control, multi-modal pain control -JP drain serous. Continue for now, likely remove prior to d/c - Carb modified diet -mobilize and pulm toilet   FEN - Per TRH otherwise VTE - Lovenox ID - Maxipime/Flagyl  x 5 days post op    LOS: 7 days   I reviewed specialist notes, hospitalist notes, nursing notes, last 24 h vitals and pain scores, last 48 h intake and output, last 24 h labs and trends, and last 24 h imaging results.   Lonni CHRISTELLA Pizza, MD  [facility] Surgery 11/26/2023, 11:07 AM Please see Amion for pager number during day hours 7:00am-4:30pm

## 2023-11-26 NOTE — Progress Notes (Signed)
 This RN spoke with pt's family at bedside about CT results from earlier in day from MD. Pt's family are concerned about pt's mentation and pain and lack of mobility that they express has not been addressed since this admission. Family wants to have neuro consulted to assess pt and wants MD to contact D. Malcolm, pts current neurology dr. Kandy family expressed that they are relieved that the scans are unremarkable but now want pt to have focused care on the surgery he was supposed to get prior to this admission. This RN will pass on to [facility] MD to address when back to see pt.

## 2023-11-27 DIAGNOSIS — R739 Hyperglycemia, unspecified: Secondary | ICD-10-CM | POA: Diagnosis not present

## 2023-11-27 DIAGNOSIS — N179 Acute kidney failure, unspecified: Secondary | ICD-10-CM | POA: Diagnosis not present

## 2023-11-27 DIAGNOSIS — K251 Acute gastric ulcer with perforation: Secondary | ICD-10-CM | POA: Diagnosis not present

## 2023-11-27 DIAGNOSIS — E872 Acidosis, unspecified: Secondary | ICD-10-CM | POA: Diagnosis not present

## 2023-11-27 LAB — GLUCOSE, CAPILLARY
Glucose-Capillary: 102 mg/dL — ABNORMAL HIGH (ref 70–99)
Glucose-Capillary: 126 mg/dL — ABNORMAL HIGH (ref 70–99)
Glucose-Capillary: 227 mg/dL — ABNORMAL HIGH (ref 70–99)
Glucose-Capillary: 80 mg/dL (ref 70–99)
Glucose-Capillary: 90 mg/dL (ref 70–99)
Glucose-Capillary: 92 mg/dL (ref 70–99)

## 2023-11-27 LAB — H. PYLORI ANTIGEN, STOOL: H. Pylori Stool Ag, Eia: NEGATIVE

## 2023-11-27 MED ORDER — METHYLPREDNISOLONE SODIUM SUCC 125 MG IJ SOLR
125.0000 mg | Freq: Once | INTRAMUSCULAR | Status: AC
  Administered 2023-11-27: 125 mg via INTRAVENOUS
  Filled 2023-11-27: qty 2

## 2023-11-27 MED ORDER — PREDNISONE 50 MG PO TABS
50.0000 mg | ORAL_TABLET | Freq: Every day | ORAL | Status: DC
  Administered 2023-11-28 – 2023-11-29 (×2): 50 mg via ORAL
  Filled 2023-11-27 (×2): qty 1

## 2023-11-27 NOTE — Progress Notes (Signed)
 Physical Therapy Treatment Patient Details Name: Theodore Stevenson MRN: [id number] DOB: [birth year] Today's Date: 11/27/2023   History of Present Illness Patient is a 68 year old male who presented to the ED with c/o severe abdominal pain.  Dx with perforated prepyloric ulcer with abscesses and AKI.  Patient underwent lap procedure with drainage and Graham patch & washout on 11/21/23.  PMHx includes HTN, hepatitis, Hx of smoking, OA, spinal stenosis, left low back pain with sciatica, bilateral THA    PT Comments  Family agreeable to therapy session. Pt agreeable with encouragement from family. Min-Mod A +2 for safety for mobility. He follows 1 step commands inconsistently. He remains impaired cognitively. He appears restless-possibly due to pain. He participated fairly well. Assisted pt into recliner to sit up a bit/change positioning. Family in room at end of session. Discussed d/c plan-plan is for home (family declines placement)-agreeable to HHPT f/u.    If plan is discharge home, recommend the following: A lot of help with bathing/dressing/bathroom;Help with stairs or ramp for entrance;Assistance with cooking/housework;Assist for transportation;A lot of help with walking and/or transfers   Can travel by private vehicle        Equipment Recommendations  Rolling walker (2 wheels)    Recommendations for Other Services       Precautions / Restrictions Precautions Precautions: Fall Recall of Precautions/Restrictions: Impaired Precaution/Restrictions Comments: JP drain; awaiting neck surgery with neurosurgery per family Restrictions Weight Bearing Restrictions Per Provider Order: No     Mobility  Bed Mobility Overal bed mobility: Needs Assistance Bed Mobility: Rolling, Sidelying to Sit Rolling: Min assist Sidelying to sit: Mod assist;+2 physical assistance;+2 safety/equipment       General bed mobility comments: increased time, pt able to scoot to EOB with increased time. Requires  multimodal cuing to prevent sliding too far forward- family reports pt sits at end of recilner at home to assist with finding comfortable spot due to L LE pain.    Transfers Overall transfer level: Needs assistance Equipment used: Rolling walker (2 wheels) Transfers: Sit to/from Stand, Bed to chair/wheelchair/BSC Sit to Stand: Min assist, +2 physical assistance, +2 safety/equipment   Step pivot transfers: Min assist, +2 safety/equipment       General transfer comment: Initially Min A+2 for power up to stand from EOB. Cues for safety, technique, hand placement. Assist to steady and manage RW.    Ambulation/Gait               General Gait Details: NT on today-still lethargic, not following commands consistently   Stairs             Wheelchair Mobility     Tilt Bed    Modified Rankin (Stroke Patients Only)       Balance Overall balance assessment: Needs assistance Sitting-balance support: Bilateral upper extremity supported, Feet supported Sitting balance-Leahy Scale: Fair     Standing balance support: Bilateral upper extremity supported, Reliant on assistive device for balance, During functional activity Standing balance-Leahy Scale: Poor                              Communication Communication Communication: Impaired Factors Affecting Communication: Reduced clarity of speech  Cognition Arousal: Lethargic Behavior During Therapy: Impulsive, Restless   PT - Cognitive impairments: Orientation, Problem solving, Safety/Judgement, Memory                       PT - Cognition Comments:  Difficult to assess orientation and cognition due to lethargy during session and pt with limited verbal responses to therapist/family during session. Pt impulsive and not very vocal, but attempting to self initiate movement throughout session. +2 provided for safety. Following commands: Impaired Following commands impaired: Follows one step commands  inconsistently    Cueing Cueing Techniques: Verbal cues, Gestural cues, Tactile cues, Visual cues  Exercises      General Comments        Pertinent Vitals/Pain Pain Assessment Pain Assessment: Faces Faces Pain Scale: Hurts even more Pain Location: pt unable to state (back, LE per family) Pain Descriptors / Indicators: Grimacing, Guarding Pain Intervention(s): Limited activity within patient's tolerance, Monitored during session, Repositioned    Home Living                          Prior Function            PT Goals (current goals can now be found in the care plan section) Progress towards PT goals: Progressing toward goals    Frequency    Min 3X/week      PT Plan      Co-evaluation              AM-PAC PT 6 Clicks Mobility   Outcome Measure  Help needed turning from your back to your side while in a flat bed without using bedrails?: A Lot Help needed moving from lying on your back to sitting on the side of a flat bed without using bedrails?: A Lot Help needed moving to and from a bed to a chair (including a wheelchair)?: A Lot Help needed standing up from a chair using your arms (e.g., wheelchair or bedside chair)?: A Lot Help needed to walk in hospital room?: A Lot Help needed climbing 3-5 steps with a railing? : Total 6 Click Score: 11    End of Session Equipment Utilized During Treatment: Gait belt Activity Tolerance: Patient tolerated treatment well;Patient limited by pain;Patient limited by lethargy Patient left: in chair;with call bell/phone within reach;with family/visitor present   PT Visit Diagnosis: Other abnormalities of gait and mobility (R26.89);Muscle weakness (generalized) (M62.81);Pain;Difficulty in walking, not elsewhere classified (R26.2) Pain - Right/Left: Left Pain - part of body: Leg (back)     Time: 8483-8465 PT Time Calculation (min) (ACUTE ONLY): 18 min  Charges:    $Therapeutic Activity: 8-22 mins PT General  Charges $$ ACUTE PT VISIT: 1 Visit                        Dannial SQUIBB, PT Acute Rehabilitation  Office: [phone]

## 2023-11-27 NOTE — Progress Notes (Signed)
   11/27/23 0007  BiPAP/CPAP/SIPAP  Reason BIPAP/CPAP not in use Non-compliant (patient unable to tolerate per RN)

## 2023-11-27 NOTE — Progress Notes (Signed)
 Physical Therapy Treatment Patient Details Name: Theodore Stevenson MRN: [id number] DOB: 03-Dec-1955 Today's Date: 11/27/2023   History of Present Illness Patient is a 68 year old male who presented to the ED with c/o severe abdominal pain.  Dx with perforated prepyloric ulcer with abscesses and AKI.  Patient underwent lap procedure with drainage and Graham patch & washout on 11/21/23.  PMHx includes HTN, hepatitis, Hx of smoking, OA, spinal stenosis, left low back pain with sciatica, bilateral THA    PT Comments  NT requested assistance with standing pt for hygiene and for back to bed. Min A +2 for safety for standing, pivot transfer with RW. Mod A +2 for bed mobility. He required multimodal 1 step cueing. Assisted pt back to bed. Family in room at end of session.     If plan is discharge home, recommend the following: A lot of help with bathing/dressing/bathroom;Help with stairs or ramp for entrance;Assistance with cooking/housework;Assist for transportation;A lot of help with walking and/or transfers   Can travel by private vehicle        Equipment Recommendations  Rolling walker (2 wheels)    Recommendations for Other Services       Precautions / Restrictions Precautions Precautions: Fall Recall of Precautions/Restrictions: Impaired Precaution/Restrictions Comments: JP drain; awaiting neck surgery with neurosurgery per family Restrictions Weight Bearing Restrictions Per Provider Order: No     Mobility  Bed Mobility Overal bed mobility: Needs Assistance Bed Mobility: Sit to Sidelying Rolling: Min assist      Sit to sidelying: Mod assist, +2 for safety/equipment, +2 for physical assistance General bed mobility comments: increased time. Assist for trunk and bil LEs.    Transfers Overall transfer level: Needs assistance Equipment used: Rolling walker (2 wheels) Transfers: Sit to/from Stand Sit to Stand: Min assist, +2 safety/equipment   Step pivot transfers: Min assist,  +2 safety/equipment       General transfer comment: A to power up to stand from recliner. Cues for safety, technique, hand placement. Assist to steady and manage RW.    Ambulation/Gait               General Gait Details: NT on today-still lethargic, not following commands consistently, having pain   Stairs             Wheelchair Mobility     Tilt Bed    Modified Rankin (Stroke Patients Only)       Balance Overall balance assessment: Needs assistance Sitting-balance support: Bilateral upper extremity supported, Feet supported Sitting balance-Leahy Scale: Fair     Standing balance support: Bilateral upper extremity supported, Reliant on assistive device for balance, During functional activity Standing balance-Leahy Scale: Poor                              Communication Communication Communication: Impaired Factors Affecting Communication: Reduced clarity of speech  Cognition Arousal: Lethargic Behavior During Therapy: Impulsive, Restless   PT - Cognitive impairments: Orientation, Problem solving, Safety/Judgement, Memory                       PT - Cognition Comments: Difficult to assess orientation and cognition due to lethargy during session and pt with limited verbal responses to therapist/family during session. Pt impulsive and not very vocal, but attempting to self initiate movement throughout session. +2 provided for safety. Following commands: Impaired Following commands impaired: Follows one step commands inconsistently    Cueing Cueing  Techniques: Verbal cues, Gestural cues, Tactile cues, Visual cues  Exercises      General Comments        Pertinent Vitals/Pain Pain Assessment Pain Assessment: Faces Faces Pain Scale: Hurts even more Pain Location: pt unable to state (back, LE per family) Pain Descriptors / Indicators: Grimacing, Guarding, Moaning Pain Intervention(s): Limited activity within patient's tolerance,  Monitored during session, Repositioned    Home Living                          Prior Function            PT Goals (current goals can now be found in the care plan section) Progress towards PT goals: Progressing toward goals    Frequency    Min 3X/week      PT Plan      Co-evaluation              AM-PAC PT 6 Clicks Mobility   Outcome Measure  Help needed turning from your back to your side while in a flat bed without using bedrails?: A Lot Help needed moving from lying on your back to sitting on the side of a flat bed without using bedrails?: A Lot Help needed moving to and from a bed to a chair (including a wheelchair)?: A Lot Help needed standing up from a chair using your arms (e.g., wheelchair or bedside chair)?: A Lot Help needed to walk in hospital room?: A Lot Help needed climbing 3-5 steps with a railing? : Total 6 Click Score: 11    End of Session Equipment Utilized During Treatment: Gait belt Activity Tolerance: Patient tolerated treatment well;Patient limited by pain;Patient limited by lethargy Patient left: in bed;with call bell/phone within reach;with bed alarm set;with family/visitor present   PT Visit Diagnosis: Other abnormalities of gait and mobility (R26.89);Muscle weakness (generalized) (M62.81);Pain;Difficulty in walking, not elsewhere classified (R26.2) Pain - Right/Left: Left Pain - part of body: Leg (back)     Time: 8646-8597 PT Time Calculation (min) (ACUTE ONLY): 9 min  Charges:    $Therapeutic Activity: 8-22 mins PT General Charges $$ ACUTE PT VISIT: 1 Visit                        Dannial SQUIBB, PT Acute Rehabilitation  Office: [phone]

## 2023-11-27 NOTE — Progress Notes (Signed)
 New order received---pt already on caseload. Looks like the previous order had been discontinued and now a new order re-entered. Will continue to follow patient.  Donny BECKER OT Acute Rehabilitation Services Office [phone]

## 2023-11-27 NOTE — Progress Notes (Addendum)
 PROGRESS NOTE    Theodore Stevenson  FMW:[id number] DOB: [birth year] DOA: 11/19/2023 PCP: Patient, No Pcp Per   Brief Narrative:  Patient is a 68 year old male with HTN, spinal stenosis, chronic back pain presented to ED with severe abdominal pain with concern over perforated gastric ulcer and abscess on imaging status post diagnostic laparoscope with drainage of abscess and omental Arlyss patch of ulcer on 11/21/2023.  Patient continues to improve daily, noted ongoing flatus and bowel movements over the past 24 hours, NG tube remains to suction with moderate output, diet being advanced per general surgery once NG output improves.  Assessment & Plan:   Principal Problem:   Perforated prepyloric ulcer s/p lap omental Arlyss patch/washout 11/21/2023 Active Problems:   Hypertension   AKI (acute kidney injury)   Hyperkalemia   Hyperglycemia   UTI (urinary tract infection)   PUD (peptic ulcer disease)   Perforated abdominal viscus   Acidosis, lactic    Acute metabolic encephalopathy, improving Polypharmacy unlikely Metabolic alkalosis, iatrogenic Hypercarbia ruled out - Patient notably altered on 11/12 -slowly improving over the past few days - CT head - 11/26/2023 negative for any acute changes/findings - no clear etiology for confusion - Continues to improve slowly -avoid CNS depressants as possible - Narcotics previously decreased, no utilization now in over 24 hours, unlikely to be etiology for prior mental status changes - Initially thought to be possible hypercarbia in the setting of narcotics, but ruled out after cessation of narcotics and normal CO2 on ABG - Family concerned in regards to complex pain syndrome causing mental status changes given known spinal stenosis -have reached out to neurosurgery for recommendations and regards to repeat imaging or potentially steroids - Discussed with Dr. Malcolm okay for short steroid trial follow-up for symptoms  Micro pneumoperitoneum,  perforated gastric ulcer with abscesses  -status post diagnostic laparoscopy, drainage of abscesses, omental Arlyss patch of ulcer per general surgery, Dr. Sheldon 11/21/2023.  - NG tube previously removed 11/13, tolerating p.o. more appropriately over the past 24 hours - Abdomen remains moderately distended with ongoing bowel sounds - Continue prophylactic antibiotics per general surgery x 5 days  -continue cefepime, Flagyl  - [facility] recommending outpatient follow-up after discharge, no intervention required at this time inpatient  AKI with hyperkalemia, stable Nongapped acidosis, resolved, currently alkalotic as above Concurrent lactic acidosis, resolved - Renal function approaching baseline, continue to follow clinically - Urine output improving, transition to LR until p.o. status improves - Continue fluids while NG tube remains to suction and patient remains n.p.o. - Hyperkalemia resolved-mild hypokalemia ongoing as below   Subsequent hypokalemia Hyperkalemia, resolved -now borderline hypokalemia - Status post Lokelma previously. - Potassium remains low, supplement cautiously given prior hyperkalemia   Hyperglycemia without diagnosis of DM - Hemoglobin A1c noted at 5.3.   Spinal stenosis, cervical, chronic  Ambulatory dysfunction, acute on chronic -Patient plan for evaluation/procedure with Dr. Malcolm neurosurgery in December - Previously on steroid taper in October with minimal improvement in symptoms.  Patient remains in substantial pain currently but attempting to avoid narcotics due to mental status as above, may benefit from additional steroid taper and/or steroid injection pending further discussion with neurosurgical team.  UTI ruled out - Urinalysis concerning for UTI. - Urine cultures with <10,000 colonies  Obesity class II - Body mass index is 30.45 kg/m.  DVT prophylaxis: enoxaparin (LOVENOX) injection 40 mg Start: 11/22/23 1000 SCDs Start: 11/19/23 2349 Code Status:    Code Status: Full Code Family Communication: At bedside  Status is: Inpatient  Dispo: The patient is from: Home              Anticipated d/c is to: Home              Anticipated d/c date is: 72+ hours              Patient currently not medically stable for discharge  Consultants:  General surgery  Procedures:  Diagnostic laparoscopy with drainage of abscess and omental Arlyss patch of ulcer on 11/21/2023  Antimicrobials:  Cefepime, Flagyl   Subjective: No acute issues or events overnight, mental status appears to be improving slowly, not yet back to baseline but able to communicate more appropriately today, tolerating p.o. quite well without dysphagia or choking.  Appetite slowly returning.  Family updated at bedside, CT head as above unremarkable.  Objective: Vitals:   11/26/23 1421 11/26/23 2027 11/27/23 0410 11/27/23 0500  BP: (!) 170/60 (!) 150/89 (!) 170/63 (!) 152/70  Pulse: 65 65 74   Resp: 16  18   Temp: 98.5 F (36.9 C) 97.8 F (36.6 C) 98.5 F (36.9 C)   TempSrc: Oral Axillary Oral   SpO2: 99% 100% 97%   Weight:      Height:        Intake/Output Summary (Last 24 hours) at 11/27/2023 0758 Last data filed at 11/27/2023 0015 Gross per 24 hour  Intake 387.13 ml  Output 576 ml  Net -188.87 ml   Filed Weights   11/19/23 1614  Weight: 83 kg    Examination:  General: Comfortably resting in bed, remains somnolent arousable but minimally interactive, able to follow commands but confused with complex conversation or actions HEENT:  Normocephalic atraumatic.  Sclerae nonicteric, noninjected.  Extraocular movements intact bilaterally.  NG tube draining clear bilious fluid Neck:  Without mass or deformity.  Trachea is midline. Lungs:  Clear to auscultate bilaterally without rhonchi, wheeze, or rales. Heart:  Regular rate and rhythm.  Without murmurs, rubs, or gallops. Abdomen: Distended somewhat, diffusely tender; drain without noted purulence or bleeding - without  guarding or rebound. Extremities: Without pitting edema Skin:  Warm and dry, no erythema.  Data Reviewed: I have personally reviewed following labs and imaging studies  CBC: Recent Labs  Lab 11/21/23 0429 11/22/23 0425 11/23/23 0449 11/24/23 1003 11/26/23 0415  WBC 9.1 7.7 6.1 6.7 9.7  HGB 10.6* 9.1* 9.0* 10.2* 9.6*  HCT 35.0* 29.2* 28.4* 32.2* 29.8*  MCV 107.4* 102.1* 101.1* 100.6* 99.0  PLT 221 174 153 166 229   Basic Metabolic Panel: Recent Labs  Lab 11/22/23 0425 11/23/23 0449 11/24/23 0417 11/24/23 2125 11/25/23 0425 11/25/23 1650 11/26/23 0415  NA 140 141 141 140 141 140 141  K 3.7 3.2* 3.1* 4.2 4.0 3.5 3.3*  CL 109 104 99 98 99 99 99  CO2 21* 28 34* 33* 35* 33* 33*  GLUCOSE 59* 175* 126* 106* 81 100* 90  BUN 53* 32* 26* 23 23 25* 26*  CREATININE 1.53* 1.07 0.96 1.08 1.17 1.29* 1.29*  CALCIUM 7.8* 7.6* 7.7* 8.0* 8.1* 8.3* 8.2*  MG 2.0 2.0  --   --   --   --   --   PHOS 2.7 2.1*  --   --   --   --   --    GFR: Estimated Creatinine Clearance: 52.8 mL/min (A) (by C-G formula based on SCr of 1.29 mg/dL (H)).  Liver Function Tests: Recent Labs  Lab 11/21/23 0429 11/22/23 0425 11/23/23 0449 11/25/23  1650  AST 40 38 29 141*  ALT 51* 44 35 105*  ALKPHOS 56 68 87 386*  BILITOT 0.3 0.3 0.3 0.5  PROT 5.1* 5.2* 4.8* 5.2*  ALBUMIN 2.4* 2.7* 2.3* 2.4*   Recent Labs  Lab 11/22/23 0425  LIPASE 11   CBG: Recent Labs  Lab 11/26/23 1608 11/26/23 2019 11/27/23 0012 11/27/23 0408 11/27/23 0724  GLUCAP 104* 86 92 90 80   Sepsis Labs: Recent Labs  Lab 11/21/23 0804  LATICACIDVEN 1.4    Recent Results (from the past 240 hours)  Urine Culture     Status: Abnormal   Collection Time: 11/19/23  6:00 PM   Specimen: Urine, Clean Catch  Result Value Ref Range Status   Specimen Description   Final    URINE, CLEAN CATCH Performed at [facility], [address] [place], [place] [address]    Special Requests   Final    NONE Performed at [facility], [address] [place], [place] [address]    Culture (A)  Final    <10,000 COLONIES/mL INSIGNIFICANT GROWTH Performed at [facility], [address] [place], [place] [address]    Report Status 11/20/2023 FINAL  Final  Blood culture (routine x 2)     Status: None   Collection Time: 11/19/23  7:35 PM   Specimen: BLOOD  Result Value Ref Range Status   Specimen Description   Final    BLOOD RIGHT ANTECUBITAL Performed at [facility], [address] [place], [place] [address]    Special Requests   Final    BOTTLES DRAWN AEROBIC AND ANAEROBIC Blood Culture adequate volume Performed at [facility], [address] [place], [place] [address]    Culture   Final    NO GROWTH 5 DAYS Performed at [facility], [address], [place], [place] [address]    Report Status 11/24/2023 FINAL  Final  Blood culture (routine x 2)     Status: None   Collection Time: 11/19/23  7:45 PM   Specimen: BLOOD  Result Value Ref Range Status   Specimen Description   Final    BLOOD LEFT ANTECUBITAL Performed at [facility], [address] [place], [place] [address]    Special Requests   Final    BOTTLES DRAWN AEROBIC AND ANAEROBIC Blood Culture adequate volume Performed at [facility], [address] [place], [place] [address]    Culture   Final    NO GROWTH 5 DAYS Performed at [facility], [address], [place], [place] [address]    Report Status 11/24/2023 FINAL  Final    Radiology Studies: CT ABDOMEN PELVIS W CONTRAST Result Date: 11/26/2023 CLINICAL DATA:  Status post repair perforated gastric ulcer on 11/21/2023. Confusion postoperatively. EXAM: CT ABDOMEN AND PELVIS WITH CONTRAST TECHNIQUE: Multidetector CT imaging of the abdomen and pelvis was performed using the standard protocol following bolus administration of intravenous contrast. RADIATION DOSE REDUCTION: This exam was performed according to the  departmental dose-optimization program which includes automated exposure control, adjustment of the mA and/or kV according to patient size and/or use of iterative reconstruction technique. CONTRAST:  OMNIPAQUE  IOHEXOL  300 MG/ML  SOLN COMPARISON:  None Available. FINDINGS: Lower chest: Small bilateral pleural effusions, right greater than left. Associated right basilar atelectasis. Hepatobiliary: Stable hepatic steatosis. Unremarkable gallbladder. No biliary ductal dilatation. Pancreas: Unremarkable. No pancreatic ductal  dilatation or surrounding inflammatory changes. Spleen: No splenic injury or perisplenic hematoma. Adrenals/Urinary Tract: Adrenal glands are unremarkable. Kidneys are normal, without renal calculi, focal lesion, or hydronephrosis. Bladder is unremarkable. Stomach/Bowel: No bowel obstruction or significant ileus. There is some fluid in air in the stomach. Small amount of free intraperitoneal air remains in the anterior peritoneal cavity, likely related to recent surgery. Surgical drain terminates adjacent to the liver and just superior to the pyloric region. Vascular/Lymphatic: Atherosclerosis of the abdominal aorta without aneurysm. No lymphadenopathy identified. Reproductive: Prostate is unremarkable. Other: Scattered small amount of free fluid in the peritoneal cavity, primarily in the lower abdomen and pelvis. No focal marginated abscess identified. Bladder contains a Foley catheter. Musculoskeletal: Degenerative disc disease of the lower lumbar spine. Bilateral hip arthroplasty. IMPRESSION: 1. Small amount of free intraperitoneal air remains in the anterior peritoneal cavity, likely related to recent surgery. 2. Scattered small amount of free fluid in the peritoneal cavity, primarily in the lower abdomen and pelvis. No focal marginated abscess identified. 3. Small bilateral pleural effusions, right greater than left. Associated right basilar atelectasis. 4. Stable hepatic steatosis. 5.  Aortic atherosclerosis. Electronically Signed   By: Marcey Moan M.D.   On: 11/26/2023 15:53   CT HEAD WO CONTRAST ( ) Result Date: 11/26/2023 EXAM: CT HEAD WITHOUT CONTRAST 11/26/2023 01:41:03 PM TECHNIQUE: CT of the head was performed without the administration of intravenous contrast. Automated exposure control, iterative reconstruction, and/or weight based adjustment of the mA/kV was utilized to reduce the radiation dose to as low as reasonably achievable. COMPARISON: None available. CLINICAL HISTORY: 68 year old male. Headache, increasing frequency or severity. FINDINGS: BRAIN AND VENTRICLES: Normal brain volume for age. Mild for age mostly periventricular white matter hypodensity. Otherwise normal gray white differentiation. Normal basilar cisterns. No acute hemorrhage. No evidence of acute infarct. No hydrocephalus. No extra-axial collection. No mass effect or midline shift. Calcified atherosclerosis at the skull base. No suspicious intracranial vascular hyperdensity. ORBITS: No acute abnormality. SINUSES: Mild to moderate bilateral paranasal sinus mucosal thickening, mild bubbly opacity associated. No sinus fluid levels. Tympanic cavities and mastoids appear clear. SOFT TISSUES AND SKULL: Mild left posterior convexity scalp soft tissue scarring. No skull fracture. IMPRESSION: 1. No acute intracranial abnormality. Mild for age cerebral white matter changes most commonly due to small vessel disease. 2. Mild to moderate bilateral paranasal sinus inflammation. Electronically signed by: Helayne Hurst MD 11/26/2023 02:07 PM EST RP Workstation: [id number]    Scheduled Meds:  Chlorhexidine  Gluconate Cloth  6 each Topical Daily   enoxaparin (LOVENOX) injection  40 mg Subcutaneous Q24H   insulin aspart  0-6 Units Subcutaneous Q4H   methocarbamol  (ROBAXIN ) injection  500 mg Intravenous Q8H   pantoprazole (PROTONIX) IV  40 mg Intravenous Q24H   sodium chloride  flush  3 mL Intravenous Q12H   Continuous  Infusions:     LOS: 8 days   Time spent:  Theodore JAYSON [place], DO [facility]  If 7PM-7AM, please contact night-coverage [email or web address]  11/27/2023, 7:58 AM

## 2023-11-27 NOTE — Progress Notes (Signed)
 6 Days Post-Op   Subjective/Chief Complaint: Mental status - able to converse with his wife Back pain No nausea or vomiting BM yesterday CT scan no complications in abdomen/ pelvis Tolerating diet   Objective: Vital signs in last 24 hours: Temp:  [97.8 F (36.6 C)-98.5 F (36.9 C)] 98.5 F (36.9 C) (11/15 0410) Pulse Rate:  [65-74] 74 (11/15 0410) Resp:  [16-18] 18 (11/15 0410) BP: (150-170)/(60-89) 152/70 (11/15 0500) SpO2:  [97 %-100 %] 97 % (11/15 0410) Last BM Date : 11/26/23  Intake/Output from previous day: 11/14 0701 - 11/15 0700 In: 387.1 [P.O.:120; IV Piggyback:267.1] Out: 576 [Urine:556; Drains:20] Intake/Output this shift: No intake/output data recorded.  WDWN in NAD Intermittently conversant Abd - soft, non-tender Incisions c/d/I JP drain - thin serous output  Lab Results:  Recent Labs    11/24/23 1003 11/26/23 0415  WBC 6.7 9.7  HGB 10.2* 9.6*  HCT 32.2* 29.8*  PLT 166 229   BMET Recent Labs    11/25/23 1650 11/26/23 0415  NA 140 141  K 3.5 3.3*  CL 99 99  CO2 33* 33*  GLUCOSE 100* 90  BUN 25* 26*  CREATININE 1.29* 1.29*  CALCIUM 8.3* 8.2*   PT/INR No results for input(s): LABPROT, INR in the last 72 hours. ABG Recent Labs    11/24/23 1956 11/25/23 0209  PHART 7.61* 7.57*  HCO3 46.2* 41.2*    Studies/Results: CT ABDOMEN PELVIS W CONTRAST Result Date: 11/26/2023 CLINICAL DATA:  Status post repair perforated gastric ulcer on 11/21/2023. Confusion postoperatively. EXAM: CT ABDOMEN AND PELVIS WITH CONTRAST TECHNIQUE: Multidetector CT imaging of the abdomen and pelvis was performed using the standard protocol following bolus administration of intravenous contrast. RADIATION DOSE REDUCTION: This exam was performed according to the departmental dose-optimization program which includes automated exposure control, adjustment of the mA and/or kV according to patient size and/or use of iterative reconstruction technique. CONTRAST:   OMNIPAQUE  IOHEXOL  300 MG/ML  SOLN COMPARISON:  None Available. FINDINGS: Lower chest: Small bilateral pleural effusions, right greater than left. Associated right basilar atelectasis. Hepatobiliary: Stable hepatic steatosis. Unremarkable gallbladder. No biliary ductal dilatation. Pancreas: Unremarkable. No pancreatic ductal dilatation or surrounding inflammatory changes. Spleen: No splenic injury or perisplenic hematoma. Adrenals/Urinary Tract: Adrenal glands are unremarkable. Kidneys are normal, without renal calculi, focal lesion, or hydronephrosis. Bladder is unremarkable. Stomach/Bowel: No bowel obstruction or significant ileus. There is some fluid in air in the stomach. Small amount of free intraperitoneal air remains in the anterior peritoneal cavity, likely related to recent surgery. Surgical drain terminates adjacent to the liver and just superior to the pyloric region. Vascular/Lymphatic: Atherosclerosis of the abdominal aorta without aneurysm. No lymphadenopathy identified. Reproductive: Prostate is unremarkable. Other: Scattered small amount of free fluid in the peritoneal cavity, primarily in the lower abdomen and pelvis. No focal marginated abscess identified. Bladder contains a Foley catheter. Musculoskeletal: Degenerative disc disease of the lower lumbar spine. Bilateral hip arthroplasty. IMPRESSION: 1. Small amount of free intraperitoneal air remains in the anterior peritoneal cavity, likely related to recent surgery. 2. Scattered small amount of free fluid in the peritoneal cavity, primarily in the lower abdomen and pelvis. No focal marginated abscess identified. 3. Small bilateral pleural effusions, right greater than left. Associated right basilar atelectasis. 4. Stable hepatic steatosis. 5. Aortic atherosclerosis. Electronically Signed   By: Marcey Moan M.D.   On: 11/26/2023 15:53   CT HEAD WO CONTRAST ( ) Result Date: 11/26/2023 EXAM: CT HEAD WITHOUT CONTRAST 11/26/2023 01:41:03 PM  TECHNIQUE: CT of  the head was performed without the administration of intravenous contrast. Automated exposure control, iterative reconstruction, and/or weight based adjustment of the mA/kV was utilized to reduce the radiation dose to as low as reasonably achievable. COMPARISON: None available. CLINICAL HISTORY: 68 year old male. Headache, increasing frequency or severity. FINDINGS: BRAIN AND VENTRICLES: Normal brain volume for age. Mild for age mostly periventricular white matter hypodensity. Otherwise normal gray white differentiation. Normal basilar cisterns. No acute hemorrhage. No evidence of acute infarct. No hydrocephalus. No extra-axial collection. No mass effect or midline shift. Calcified atherosclerosis at the skull base. No suspicious intracranial vascular hyperdensity. ORBITS: No acute abnormality. SINUSES: Mild to moderate bilateral paranasal sinus mucosal thickening, mild bubbly opacity associated. No sinus fluid levels. Tympanic cavities and mastoids appear clear. SOFT TISSUES AND SKULL: Mild left posterior convexity scalp soft tissue scarring. No skull fracture. IMPRESSION: 1. No acute intracranial abnormality. Mild for age cerebral white matter changes most commonly due to small vessel disease. 2. Mild to moderate bilateral paranasal sinus inflammation. Electronically signed by: Helayne Hurst MD 11/26/2023 02:07 PM EST RP Workstation: [id number]    Anti-infectives: Anti-infectives (From admission, onward)    Start     Dose/Rate Route Frequency Ordered Stop   11/23/23 1400  ceFEPIme (MAXIPIME) 2 g in sodium chloride  0.9 % 100 mL IVPB        2 g 200 mL/hr over 30 Minutes Intravenous Every 8 hours 11/23/23 1021 11/26/23 2335   11/22/23 0300  ceFEPIme (MAXIPIME) 2 g in sodium chloride  0.9 % 100 mL IVPB  Status:  Discontinued        2 g 200 mL/hr over 30 Minutes Intravenous Every 24 hours 11/21/23 1323 11/23/23 1021   11/21/23 2000  metroNIDAZOLE  (FLAGYL ) IVPB 500 mg        500 mg 100 mL/hr  over 60 Minutes Intravenous Every 12 hours 11/21/23 1323 11/26/23 1656   11/21/23 0300  ceFEPIme (MAXIPIME) 2 g in sodium chloride  0.9 % 100 mL IVPB  Status:  Discontinued        2 g 200 mL/hr over 30 Minutes Intravenous Every 24 hours 11/20/23 0525 11/21/23 1323   11/20/23 0800  metroNIDAZOLE  (FLAGYL ) IVPB 500 mg  Status:  Discontinued        500 mg 100 mL/hr over 60 Minutes Intravenous Every 12 hours 11/19/23 2351 11/21/23 1323   11/20/23 0100  ceFEPIme (MAXIPIME) 2 g in sodium chloride  0.9 % 100 mL IVPB  Status:  Discontinued        2 g 200 mL/hr over 30 Minutes Intravenous Every 12 hours 11/19/23 2359 11/20/23 0525   11/20/23 0045  vancomycin (VANCOREADY) IVPB 500 mg/100 mL        500 mg 100 mL/hr over 60 Minutes Intravenous  Once 11/19/23 2353 11/20/23 0144   11/19/23 1930  vancomycin (VANCOCIN) IVPB 1000 mg/200 mL premix       Placed in Followed by Linked Group   1,000 mg 200 mL/hr over 60 Minutes Intravenous  Once 11/19/23 1922 11/19/23 2223   11/19/23 1930  vancomycin (VANCOCIN) 500 mg in sodium chloride  0.9 % 100 mL IVPB  Status:  Discontinued       Placed in Followed by Linked Group   500 mg 100 mL/hr over 60 Minutes Intravenous  Once 11/19/23 1922 11/19/23 2353   11/19/23 1930  metroNIDAZOLE  (FLAGYL ) IVPB 500 mg        500 mg 100 mL/hr over 60 Minutes Intravenous  Once 11/19/23 1923 11/19/23 2223   11/19/23 1815  cefTRIAXone (ROCEPHIN) 2 g in sodium chloride  0.9 % 100 mL IVPB        2 g 200 mL/hr over 30 Minutes Intravenous  Once 11/19/23 1807 11/19/23 1929       Assessment/Plan: s/p dx lap with drainage of abscesses and omental graham patch of ulcer, by Dr. Sheldon 11/9 -UGI negative. NG out 11/12 and CLD started. Afebrile, normal wbc. Reassuring abdominal exam. Drain thin serous   -No pain during encounter. Pain control, multi-modal pain control -JP drain serous. Continue for now, likely remove prior to d/c - Carb modified diet -mobilize and pulm toilet   FEN -  Per [facility] otherwise VTE - Lovenox ID - Maxipime/Flagyl  x 5 days post op  LOS: 8 days    Theodore Stevenson 11/27/2023

## 2023-11-28 DIAGNOSIS — E872 Acidosis, unspecified: Secondary | ICD-10-CM | POA: Diagnosis not present

## 2023-11-28 DIAGNOSIS — K251 Acute gastric ulcer with perforation: Secondary | ICD-10-CM | POA: Diagnosis not present

## 2023-11-28 DIAGNOSIS — N179 Acute kidney failure, unspecified: Secondary | ICD-10-CM | POA: Diagnosis not present

## 2023-11-28 DIAGNOSIS — R739 Hyperglycemia, unspecified: Secondary | ICD-10-CM | POA: Diagnosis not present

## 2023-11-28 LAB — GLUCOSE, CAPILLARY
Glucose-Capillary: 139 mg/dL — ABNORMAL HIGH (ref 70–99)
Glucose-Capillary: 149 mg/dL — ABNORMAL HIGH (ref 70–99)
Glucose-Capillary: 155 mg/dL — ABNORMAL HIGH (ref 70–99)
Glucose-Capillary: 162 mg/dL — ABNORMAL HIGH (ref 70–99)
Glucose-Capillary: 174 mg/dL — ABNORMAL HIGH (ref 70–99)
Glucose-Capillary: 195 mg/dL — ABNORMAL HIGH (ref 70–99)
Glucose-Capillary: 212 mg/dL — ABNORMAL HIGH (ref 70–99)

## 2023-11-28 LAB — BASIC METABOLIC PANEL WITH GFR
Anion gap: 12 (ref 5–15)
BUN: 16 mg/dL (ref 8–23)
CO2: 25 mmol/L (ref 22–32)
Calcium: 8.8 mg/dL — ABNORMAL LOW (ref 8.9–10.3)
Chloride: 104 mmol/L (ref 98–111)
Creatinine, Ser: 0.88 mg/dL (ref 0.61–1.24)
GFR, Estimated: >60 mL/min (ref >60)
Glucose, Bld: 158 mg/dL — ABNORMAL HIGH (ref 70–99)
Potassium: 4 mmol/L (ref 3.5–5.1)
Sodium: 141 mmol/L (ref 135–145)

## 2023-11-28 MED ORDER — LISINOPRIL 10 MG PO TABS
10.0000 mg | ORAL_TABLET | Freq: Every day | ORAL | Status: DC
  Administered 2023-11-28 – 2023-11-29 (×2): 10 mg via ORAL
  Filled 2023-11-28 (×2): qty 1

## 2023-11-28 MED ORDER — HYDROCHLOROTHIAZIDE 25 MG PO TABS
25.0000 mg | ORAL_TABLET | Freq: Every day | ORAL | Status: DC
  Administered 2023-11-28 – 2023-11-29 (×2): 25 mg via ORAL
  Filled 2023-11-28 (×2): qty 1

## 2023-11-28 MED ORDER — HYDRALAZINE HCL 20 MG/ML IJ SOLN
10.0000 mg | Freq: Once | INTRAMUSCULAR | Status: AC
  Administered 2023-11-28: 10 mg via INTRAVENOUS
  Filled 2023-11-28: qty 1

## 2023-11-28 NOTE — Progress Notes (Signed)
 PROGRESS NOTE    Theodore Stevenson  FMW:[id number] DOB: 12-04-1955 DOA: 11/19/2023 PCP: Patient, No Pcp Per   Brief Narrative:  Patient is a 68 year old male with HTN, spinal stenosis, chronic back pain presented to ED with severe abdominal pain with concern over perforated gastric ulcer and abscess on imaging status post diagnostic laparoscope with drainage of abscess and omental Arlyss patch of ulcer on 11/21/2023.  Patient continues to improve daily, noted ongoing flatus and bowel movements over the past 24 hours, NG tube remains to suction with moderate output, diet being advanced per general surgery once NG output improves.  Assessment & Plan:   Principal Problem:   Perforated prepyloric ulcer s/p lap omental Arlyss patch/washout 11/21/2023 Active Problems:   Hypertension   AKI (acute kidney injury)   Hyperkalemia   Hyperglycemia   UTI (urinary tract infection)   PUD (peptic ulcer disease)   Perforated abdominal viscus   Acidosis, lactic    Acute metabolic encephalopathy, resolving Polypharmacy unlikely Metabolic alkalosis, iatrogenic Hypercarbia ruled out - Patient notably altered on 11/12 -slowly improving over the past few days - CT head - 11/26/2023 negative for any acute changes/findings - no clear etiology for confusion - Continues to improve - avoid CNS depressants as possible - Narcotics previously decreased, no utilization now in over 24 hours, unlikely to be etiology for prior mental status changes - Initially thought to be possible hypercarbia in the setting of narcotics, but ruled out after cessation of narcotics and normal CO2 on ABG - Discussed with Dr. Malcolm okay for short steroid trial - patient's symptoms improving markedly over the past 24h - he is at this point medically stable for planned anterior cervical decompression with Dr. Malcolm on 11/30/2023.  Will attempt to transfer patient over to [facility] to accelerate any necessary preop workup, labs,  and evaluation  Micro pneumoperitoneum, perforated gastric ulcer with abscesses  -status post diagnostic laparoscopy, drainage of abscesses, omental Arlyss patch of ulcer per general surgery, Dr. Sheldon 11/21/2023.  - NG tube previously removed 11/13, tolerating p.o. more appropriately over the past 24 hours - Abdomen remains somewhat distended with ongoing bowel sounds, flatus and bowel movements. - JP drain per general surgery - can remove prior to DC but continue for now - Completed antibiotic course - [facility] recommending outpatient follow-up after discharge, no intervention required at this time inpatient  AKI with hyperkalemia, resolved Nongapped acidosis, resolved Lactic acidosis, resolved - Renal function approaching baseline, continue to follow clinically - Urine output improving, holding IV fluids as PO intake improves - Initially required lokelma at intake - now WNL   Hypertension, POA - poorly controlled - likely secondary to prolonged NPO status and steroids - Resume home Lisinopril and hydrochlorothiazide - follow clinicaly  Hyperglycemia without diagnosis of DM - Hemoglobin A1c noted at 5.3.   Spinal stenosis, cervical, chronic  Ambulatory dysfunction, acute on chronic -Tentative plan for evaluation/treatment with anterior cervical decompression 11/30/23 - medically stable from my standpoint for procedure. Having just tolerated a much more invasive procedure as above I think he will do quite will with Dr Louis. - Restart steroids given profound pain/symptoms - improving drastically over past 24h.  UTI ruled out - Urinalysis concerning for UTI. - Urine cultures with <10,000 colonies  Obesity class II - Body mass index is 30.45 kg/m.  DVT prophylaxis: enoxaparin (LOVENOX) injection 40 mg Start: 11/22/23 1000 SCDs Start: 11/19/23 2349 Code Status:   Code Status: Full Code Family Communication: At bedside  Status is: Inpatient  Dispo: The patient is from: Home               Anticipated d/c is to: Home              Anticipated d/c date is: 72+ hours              Patient currently not medically stable for discharge  Consultants:  General surgery  Procedures:  Diagnostic laparoscopy with drainage of abscess and omental Arlyss patch of ulcer on 11/21/2023  Antimicrobials:  Cefepime, Flagyl   Subjective: No acute issues or events overnight, mental status appears to be improving slowly, not yet back to baseline but able to communicate more appropriately today, tolerating p.o. quite well without dysphagia or choking.  Appetite slowly returning.  Family updated at bedside, CT head as above unremarkable.  Objective: Vitals:   11/28/23 0428 11/28/23 0436 11/28/23 0603 11/28/23 0732  BP: (!) 197/79 (!) 194/70 (!) 173/79 (!) 173/67  Pulse: 66 65 64 71  Resp:   20   Temp: 98.6 F (37 C)     TempSrc: Axillary     SpO2: 96%  98%   Weight:      Height:        Intake/Output Summary (Last 24 hours) at 11/28/2023 0802 Last data filed at 11/28/2023 0700 Gross per 24 hour  Intake 600 ml  Output 2500 ml  Net -1900 ml   Filed Weights   11/19/23 1614  Weight: 83 kg    Examination:  General: Comfortably resting in bed, remains somnolent arousable but minimally interactive, able to follow commands but confused with complex conversation or actions HEENT:  Normocephalic atraumatic.  Sclerae nonicteric, noninjected.  Extraocular movements intact bilaterally.  NG tube draining clear bilious fluid Neck:  Without mass or deformity.  Trachea is midline. Lungs:  Clear to auscultate bilaterally without rhonchi, wheeze, or rales. Heart:  Regular rate and rhythm.  Without murmurs, rubs, or gallops. Abdomen: Distended somewhat, diffusely tender; drain without noted purulence or bleeding - without guarding or rebound. Extremities: Without pitting edema Skin:  Warm and dry, no erythema.  Data Reviewed: I have personally reviewed following labs and imaging  studies  CBC: Recent Labs  Lab 11/22/23 0425 11/23/23 0449 11/24/23 1003 11/26/23 0415  WBC 7.7 6.1 6.7 9.7  HGB 9.1* 9.0* 10.2* 9.6*  HCT 29.2* 28.4* 32.2* 29.8*  MCV 102.1* 101.1* 100.6* 99.0  PLT 174 153 166 229   Basic Metabolic Panel: Recent Labs  Lab 11/22/23 0425 11/23/23 0449 11/24/23 0417 11/24/23 2125 11/25/23 0425 11/25/23 1650 11/26/23 0415 11/28/23 0440  NA 140 141   < > 140 141 140 141 141  K 3.7 3.2*   < > 4.2 4.0 3.5 3.3* 4.0  CL 109 104   < > 98 99 99 99 104  CO2 21* 28   < > 33* 35* 33* 33* 25  GLUCOSE 59* 175*   < > 106* 81 100* 90 158*  BUN 53* 32*   < > 23 23 25* 26* 16  CREATININE 1.53* 1.07   < > 1.08 1.17 1.29* 1.29* 0.88  CALCIUM 7.8* 7.6*   < > 8.0* 8.1* 8.3* 8.2* 8.8*  MG 2.0 2.0  --   --   --   --   --   --   PHOS 2.7 2.1*  --   --   --   --   --   --    < > = values  in this interval not displayed.   GFR: Estimated Creatinine Clearance: 77.4 mL/min (by C-G formula based on SCr of 0.88 mg/dL).  Liver Function Tests: Recent Labs  Lab 11/22/23 0425 11/23/23 0449 11/25/23 1650  AST 38 29 141*  ALT 44 35 105*  ALKPHOS 68 87 386*  BILITOT 0.3 0.3 0.5  PROT 5.2* 4.8* 5.2*  ALBUMIN 2.7* 2.3* 2.4*   Recent Labs  Lab 11/22/23 0425  LIPASE 11   CBG: Recent Labs  Lab 11/27/23 1605 11/27/23 2044 11/28/23 0000 11/28/23 0415 11/28/23 0730  GLUCAP 102* 227* 195* 139* 149*   Sepsis Labs: Recent Labs  Lab 11/21/23 0804  LATICACIDVEN 1.4    Recent Results (from the past 240 hours)  Urine Culture     Status: Abnormal   Collection Time: 11/19/23  6:00 PM   Specimen: Urine, Clean Catch  Result Value Ref Range Status   Specimen Description   Final    URINE, CLEAN CATCH Performed at [facility], [address] [place], [place] [address]    Special Requests   Final    NONE Performed at [facility], [address] [place], [place] [address]    Culture (A)  Final    <10,000 COLONIES/mL INSIGNIFICANT  GROWTH Performed at [facility], [address], [place], [place] [address]    Report Status 11/20/2023 FINAL  Final  Blood culture (routine x 2)     Status: None   Collection Time: 11/19/23  7:35 PM   Specimen: BLOOD  Result Value Ref Range Status   Specimen Description   Final    BLOOD RIGHT ANTECUBITAL Performed at [facility], [address] [place], [place] [address]    Special Requests   Final    BOTTLES DRAWN AEROBIC AND ANAEROBIC Blood Culture adequate volume Performed at [facility], [address] [place], [place] [address]    Culture   Final    NO GROWTH 5 DAYS Performed at [facility], [address] [place], [place] [address]    Report Status 11/24/2023 FINAL  Final  Blood culture (routine x 2)     Status: None   Collection Time: 11/19/23  7:45 PM   Specimen: BLOOD  Result Value Ref Range Status   Specimen Description   Final    BLOOD LEFT ANTECUBITAL Performed at [facility], [address] [place], [place] [address]    Special Requests   Final    BOTTLES DRAWN AEROBIC AND ANAEROBIC Blood Culture adequate volume Performed at [facility], [address] [place], [place] [address]    Culture   Final    NO GROWTH 5 DAYS Performed at [facility], [address] [place], [place] [address]    Report Status 11/24/2023 FINAL  Final    Radiology Studies: CT ABDOMEN PELVIS W CONTRAST Result Date: 11/26/2023 CLINICAL DATA:  Status post repair perforated gastric ulcer on 11/21/2023. Confusion postoperatively. EXAM: CT ABDOMEN AND PELVIS WITH CONTRAST TECHNIQUE: Multidetector CT imaging of the abdomen and pelvis was performed using the standard protocol following bolus administration of intravenous contrast. RADIATION DOSE REDUCTION: This exam was performed according to the departmental dose-optimization program which includes automated exposure control, adjustment of the mA and/or kV according to  patient size and/or use of iterative reconstruction technique. CONTRAST:  OMNIPAQUE  IOHEXOL  300 MG/ML  SOLN COMPARISON:  None Available. FINDINGS:  Lower chest: Small bilateral pleural effusions, right greater than left. Associated right basilar atelectasis. Hepatobiliary: Stable hepatic steatosis. Unremarkable gallbladder. No biliary ductal dilatation. Pancreas: Unremarkable. No pancreatic ductal dilatation or surrounding inflammatory changes. Spleen: No splenic injury or perisplenic hematoma. Adrenals/Urinary Tract: Adrenal glands are unremarkable. Kidneys are normal, without renal calculi, focal lesion, or hydronephrosis. Bladder is unremarkable. Stomach/Bowel: No bowel obstruction or significant ileus. There is some fluid in air in the stomach. Small amount of free intraperitoneal air remains in the anterior peritoneal cavity, likely related to recent surgery. Surgical drain terminates adjacent to the liver and just superior to the pyloric region. Vascular/Lymphatic: Atherosclerosis of the abdominal aorta without aneurysm. No lymphadenopathy identified. Reproductive: Prostate is unremarkable. Other: Scattered small amount of free fluid in the peritoneal cavity, primarily in the lower abdomen and pelvis. No focal marginated abscess identified. Bladder contains a Foley catheter. Musculoskeletal: Degenerative disc disease of the lower lumbar spine. Bilateral hip arthroplasty. IMPRESSION: 1. Small amount of free intraperitoneal air remains in the anterior peritoneal cavity, likely related to recent surgery. 2. Scattered small amount of free fluid in the peritoneal cavity, primarily in the lower abdomen and pelvis. No focal marginated abscess identified. 3. Small bilateral pleural effusions, right greater than left. Associated right basilar atelectasis. 4. Stable hepatic steatosis. 5. Aortic atherosclerosis. Electronically Signed   By: Marcey Moan M.D.   On: 11/26/2023 15:53   CT HEAD WO CONTRAST  ( ) Result Date: 11/26/2023 EXAM: CT HEAD WITHOUT CONTRAST 11/26/2023 01:41:03 PM TECHNIQUE: CT of the head was performed without the administration of intravenous contrast. Automated exposure control, iterative reconstruction, and/or weight based adjustment of the mA/kV was utilized to reduce the radiation dose to as low as reasonably achievable. COMPARISON: None available. CLINICAL HISTORY: 68 year old male. Headache, increasing frequency or severity. FINDINGS: BRAIN AND VENTRICLES: Normal brain volume for age. Mild for age mostly periventricular white matter hypodensity. Otherwise normal gray white differentiation. Normal basilar cisterns. No acute hemorrhage. No evidence of acute infarct. No hydrocephalus. No extra-axial collection. No mass effect or midline shift. Calcified atherosclerosis at the skull base. No suspicious intracranial vascular hyperdensity. ORBITS: No acute abnormality. SINUSES: Mild to moderate bilateral paranasal sinus mucosal thickening, mild bubbly opacity associated. No sinus fluid levels. Tympanic cavities and mastoids appear clear. SOFT TISSUES AND SKULL: Mild left posterior convexity scalp soft tissue scarring. No skull fracture. IMPRESSION: 1. No acute intracranial abnormality. Mild for age cerebral white matter changes most commonly due to small vessel disease. 2. Mild to moderate bilateral paranasal sinus inflammation. Electronically signed by: Helayne Hurst MD 11/26/2023 02:07 PM EST RP Workstation: [id number]    Scheduled Meds:  Chlorhexidine  Gluconate Cloth  6 each Topical Daily   enoxaparin (LOVENOX) injection  40 mg Subcutaneous Q24H   insulin aspart  0-6 Units Subcutaneous Q4H   methocarbamol  (ROBAXIN ) injection  500 mg Intravenous Q8H   pantoprazole (PROTONIX) IV  40 mg Intravenous Q24H   predniSONE  50 mg Oral Q breakfast   sodium chloride  flush  3 mL Intravenous Q12H   Continuous Infusions:     LOS: 9 days   Time spent:  Elsie JAYSON Montclair,  DO [facility]  If 7PM-7AM, please contact night-coverage [email or web address]  11/28/2023, 8:02 AM

## 2023-11-28 NOTE — Progress Notes (Signed)
 7 Days Post-Op   Subjective/Chief Complaint: Mental status - improving No nausea or vomiting, having bowel function Tolerating diet   Objective: Vital signs in last 24 hours: Temp:  [98 F (36.7 C)-98.6 F (37 C)] 98.6 F (37 C) (11/16 0428) Pulse Rate:  [62-71] 71 (11/16 0732) Resp:  [18-20] 20 (11/16 0603) BP: (153-197)/(67-79) 173/67 (11/16 0732) SpO2:  [96 %-98 %] 98 % (11/16 0603) Last BM Date : 11/27/23  Intake/Output from previous day: 11/15 0701 - 11/16 0700 In: 600 [P.O.:600] Out: 2500 [Urine:2450; Drains:50] Intake/Output this shift: Total I/O In: 120 [P.O.:120] Out: -   WDWN in NAD Intermittently conversant Abd - soft, non-tender Incisions c/d/I JP drain - thin serous output  Lab Results:  Recent Labs    11/26/23 0415  WBC 9.7  HGB 9.6*  HCT 29.8*  PLT 229   BMET Recent Labs    11/26/23 0415 11/28/23 0440  NA 141 141  K 3.3* 4.0  CL 99 104  CO2 33* 25  GLUCOSE 90 158*  BUN 26* 16  CREATININE 1.29* 0.88  CALCIUM 8.2* 8.8*   PT/INR No results for input(s): LABPROT, INR in the last 72 hours. ABG No results for input(s): PHART, HCO3 in the last 72 hours.  Invalid input(s): PCO2, PO2   Studies/Results: CT ABDOMEN PELVIS W CONTRAST Result Date: 11/26/2023 CLINICAL DATA:  Status post repair perforated gastric ulcer on 11/21/2023. Confusion postoperatively. EXAM: CT ABDOMEN AND PELVIS WITH CONTRAST TECHNIQUE: Multidetector CT imaging of the abdomen and pelvis was performed using the standard protocol following bolus administration of intravenous contrast. RADIATION DOSE REDUCTION: This exam was performed according to the departmental dose-optimization program which includes automated exposure control, adjustment of the mA and/or kV according to patient size and/or use of iterative reconstruction technique. CONTRAST:  OMNIPAQUE  IOHEXOL  300 MG/ML  SOLN COMPARISON:  None Available. FINDINGS: Lower chest: Small bilateral pleural  effusions, right greater than left. Associated right basilar atelectasis. Hepatobiliary: Stable hepatic steatosis. Unremarkable gallbladder. No biliary ductal dilatation. Pancreas: Unremarkable. No pancreatic ductal dilatation or surrounding inflammatory changes. Spleen: No splenic injury or perisplenic hematoma. Adrenals/Urinary Tract: Adrenal glands are unremarkable. Kidneys are normal, without renal calculi, focal lesion, or hydronephrosis. Bladder is unremarkable. Stomach/Bowel: No bowel obstruction or significant ileus. There is some fluid in air in the stomach. Small amount of free intraperitoneal air remains in the anterior peritoneal cavity, likely related to recent surgery. Surgical drain terminates adjacent to the liver and just superior to the pyloric region. Vascular/Lymphatic: Atherosclerosis of the abdominal aorta without aneurysm. No lymphadenopathy identified. Reproductive: Prostate is unremarkable. Other: Scattered small amount of free fluid in the peritoneal cavity, primarily in the lower abdomen and pelvis. No focal marginated abscess identified. Bladder contains a Foley catheter. Musculoskeletal: Degenerative disc disease of the lower lumbar spine. Bilateral hip arthroplasty. IMPRESSION: 1. Small amount of free intraperitoneal air remains in the anterior peritoneal cavity, likely related to recent surgery. 2. Scattered small amount of free fluid in the peritoneal cavity, primarily in the lower abdomen and pelvis. No focal marginated abscess identified. 3. Small bilateral pleural effusions, right greater than left. Associated right basilar atelectasis. 4. Stable hepatic steatosis. 5. Aortic atherosclerosis. Electronically Signed   By: Marcey Moan M.D.   On: 11/26/2023 15:53   CT HEAD WO CONTRAST ( ) Result Date: 11/26/2023 EXAM: CT HEAD WITHOUT CONTRAST 11/26/2023 01:41:03 PM TECHNIQUE: CT of the head was performed without the administration of intravenous contrast. Automated exposure  control, iterative reconstruction, and/or weight based adjustment  of the mA/kV was utilized to reduce the radiation dose to as low as reasonably achievable. COMPARISON: None available. CLINICAL HISTORY: 68 year old male. Headache, increasing frequency or severity. FINDINGS: BRAIN AND VENTRICLES: Normal brain volume for age. Mild for age mostly periventricular white matter hypodensity. Otherwise normal gray white differentiation. Normal basilar cisterns. No acute hemorrhage. No evidence of acute infarct. No hydrocephalus. No extra-axial collection. No mass effect or midline shift. Calcified atherosclerosis at the skull base. No suspicious intracranial vascular hyperdensity. ORBITS: No acute abnormality. SINUSES: Mild to moderate bilateral paranasal sinus mucosal thickening, mild bubbly opacity associated. No sinus fluid levels. Tympanic cavities and mastoids appear clear. SOFT TISSUES AND SKULL: Mild left posterior convexity scalp soft tissue scarring. No skull fracture. IMPRESSION: 1. No acute intracranial abnormality. Mild for age cerebral white matter changes most commonly due to small vessel disease. 2. Mild to moderate bilateral paranasal sinus inflammation. Electronically signed by: Helayne Hurst MD 11/26/2023 02:07 PM EST RP Workstation: [id number]    Anti-infectives: Anti-infectives (From admission, onward)    Start     Dose/Rate Route Frequency Ordered Stop   11/23/23 1400  ceFEPIme (MAXIPIME) 2 g in sodium chloride  0.9 % 100 mL IVPB        2 g 200 mL/hr over 30 Minutes Intravenous Every 8 hours 11/23/23 1021 11/26/23 2335   11/22/23 0300  ceFEPIme (MAXIPIME) 2 g in sodium chloride  0.9 % 100 mL IVPB  Status:  Discontinued        2 g 200 mL/hr over 30 Minutes Intravenous Every 24 hours 11/21/23 1323 11/23/23 1021   11/21/23 2000  metroNIDAZOLE  (FLAGYL ) IVPB 500 mg        500 mg 100 mL/hr over 60 Minutes Intravenous Every 12 hours 11/21/23 1323 11/26/23 1656   11/21/23 0300  ceFEPIme (MAXIPIME) 2  g in sodium chloride  0.9 % 100 mL IVPB  Status:  Discontinued        2 g 200 mL/hr over 30 Minutes Intravenous Every 24 hours 11/20/23 0525 11/21/23 1323   11/20/23 0800  metroNIDAZOLE  (FLAGYL ) IVPB 500 mg  Status:  Discontinued        500 mg 100 mL/hr over 60 Minutes Intravenous Every 12 hours 11/19/23 2351 11/21/23 1323   11/20/23 0100  ceFEPIme (MAXIPIME) 2 g in sodium chloride  0.9 % 100 mL IVPB  Status:  Discontinued        2 g 200 mL/hr over 30 Minutes Intravenous Every 12 hours 11/19/23 2359 11/20/23 0525   11/20/23 0045  vancomycin (VANCOREADY) IVPB 500 mg/100 mL        500 mg 100 mL/hr over 60 Minutes Intravenous  Once 11/19/23 2353 11/20/23 0144   11/19/23 1930  vancomycin (VANCOCIN) IVPB 1000 mg/200 mL premix       Placed in Followed by Linked Group   1,000 mg 200 mL/hr over 60 Minutes Intravenous  Once 11/19/23 1922 11/19/23 2223   11/19/23 1930  vancomycin (VANCOCIN) 500 mg in sodium chloride  0.9 % 100 mL IVPB  Status:  Discontinued       Placed in Followed by Linked Group   500 mg 100 mL/hr over 60 Minutes Intravenous  Once 11/19/23 1922 11/19/23 2353   11/19/23 1930  metroNIDAZOLE  (FLAGYL ) IVPB 500 mg        500 mg 100 mL/hr over 60 Minutes Intravenous  Once 11/19/23 1923 11/19/23 2223   11/19/23 1815  cefTRIAXone (ROCEPHIN) 2 g in sodium chloride  0.9 % 100 mL IVPB  2 g 200 mL/hr over 30 Minutes Intravenous  Once 11/19/23 1807 11/19/23 1929       Assessment/Plan: s/p dx lap with drainage of abscesses and omental graham patch of ulcer, by Dr. Sheldon 11/9 -UGI negative. NG out 11/12 and CLD started. Afebrile, normal wbc. Reassuring abdominal exam. Drain thin serous   -No pain during encounter. Pain control, multi-modal pain control -JP drain serous. Continue for now, can remove prior to d/c - Carb modified diet -mobilize and pulm toilet   FEN - Per TRH otherwise VTE - Lovenox ID - Maxipime/Flagyl  x 5 days post op  Ok for d/c from surgical standpoint  when medically stable Can d/c JP prior to going home   LOS: 9 days    Theodore Stevenson 11/28/2023

## 2023-11-29 ENCOUNTER — Other Ambulatory Visit (HOSPITAL_COMMUNITY): Payer: Self-pay

## 2023-11-29 ENCOUNTER — Encounter (HOSPITAL_COMMUNITY): Payer: Self-pay | Admitting: Neurosurgery

## 2023-11-29 ENCOUNTER — Other Ambulatory Visit: Payer: Self-pay

## 2023-11-29 ENCOUNTER — Ambulatory Visit: Admitting: Physician Assistant

## 2023-11-29 DIAGNOSIS — N179 Acute kidney failure, unspecified: Secondary | ICD-10-CM | POA: Diagnosis not present

## 2023-11-29 DIAGNOSIS — E872 Acidosis, unspecified: Secondary | ICD-10-CM | POA: Diagnosis not present

## 2023-11-29 DIAGNOSIS — R198 Other specified symptoms and signs involving the digestive system and abdomen: Secondary | ICD-10-CM | POA: Diagnosis not present

## 2023-11-29 DIAGNOSIS — M4802 Spinal stenosis, cervical region: Secondary | ICD-10-CM

## 2023-11-29 DIAGNOSIS — K251 Acute gastric ulcer with perforation: Secondary | ICD-10-CM | POA: Diagnosis not present

## 2023-11-29 LAB — GLUCOSE, CAPILLARY
Glucose-Capillary: 139 mg/dL — ABNORMAL HIGH (ref 70–99)
Glucose-Capillary: 117 mg/dL — ABNORMAL HIGH (ref 70–99)
Glucose-Capillary: 136 mg/dL — ABNORMAL HIGH (ref 70–99)
Glucose-Capillary: 217 mg/dL — ABNORMAL HIGH (ref 70–99)

## 2023-11-29 MED ORDER — ACETAMINOPHEN 325 MG PO TABS: 650.0000 mg | ORAL_TABLET | Freq: Four times a day (QID) | ORAL | Status: AC | PRN

## 2023-11-29 MED ORDER — TRAMADOL HCL 50 MG PO TABS
25.0000 mg | ORAL_TABLET | Freq: Two times a day (BID) | ORAL | 0 refills | Status: AC | PRN
  Filled 2023-11-29: qty 15, 8d supply, fill #0

## 2023-11-29 MED ORDER — PANTOPRAZOLE SODIUM 40 MG PO TBEC
40.0000 mg | DELAYED_RELEASE_TABLET | Freq: Every day | ORAL | Status: DC
  Administered 2023-11-29: 40 mg via ORAL
  Filled 2023-11-29: qty 1

## 2023-11-29 MED ORDER — LISINOPRIL 10 MG PO TABS
10.0000 mg | ORAL_TABLET | Freq: Every day | ORAL | 0 refills | Status: AC
Start: 2023-11-30 — End: ?
  Filled 2023-11-29: qty 30, 30d supply, fill #0

## 2023-11-29 MED ORDER — PANTOPRAZOLE SODIUM 40 MG PO TBEC
40.0000 mg | DELAYED_RELEASE_TABLET | Freq: Two times a day (BID) | ORAL | 2 refills | Status: AC
  Filled 2023-11-29: qty 60, 30d supply, fill #0

## 2023-11-29 MED ORDER — HYDROCHLOROTHIAZIDE 25 MG PO TABS
25.0000 mg | ORAL_TABLET | Freq: Every day | ORAL | 0 refills | Status: AC
Start: 2023-11-30 — End: ?
  Filled 2023-11-29: qty 30, 30d supply, fill #0

## 2023-11-29 NOTE — Plan of Care (Signed)
  Problem: Health Behavior/Discharge Planning: Goal: Ability to manage health-related needs will improve Outcome: Not Progressing   Problem: Clinical Measurements: Goal: Ability to maintain clinical measurements within normal limits will improve Outcome: Progressing Goal: Will remain free from infection Outcome: Progressing Goal: Diagnostic test results will improve Outcome: Progressing Goal: Respiratory complications will improve Outcome: Progressing Goal: Cardiovascular complication will be avoided Outcome: Progressing   Problem: Nutrition: Goal: Adequate nutrition will be maintained Outcome: Progressing   Problem: Coping: Goal: Level of anxiety will decrease Outcome: Progressing   Problem: Elimination: Goal: Will not experience complications related to bowel motility Outcome: Progressing Goal: Will not experience complications related to urinary retention Outcome: Progressing   Problem: Skin Integrity: Goal: Risk for impaired skin integrity will decrease Outcome: Progressing   Problem: Coping: Goal: Ability to adjust to condition or change in health will improve Outcome: Progressing   Problem: Fluid Volume: Goal: Ability to maintain a balanced intake and output will improve Outcome: Progressing   Problem: Health Behavior/Discharge Planning: Goal: Ability to identify and utilize available resources and services will improve Outcome: Progressing Goal: Ability to manage health-related needs will improve Outcome: Progressing   Problem: Metabolic: Goal: Ability to maintain appropriate glucose levels will improve Outcome: Progressing   Problem: Nutritional: Goal: Maintenance of adequate nutrition will improve Outcome: Progressing Goal: Progress toward achieving an optimal weight will improve Outcome: Progressing   Problem: Skin Integrity: Goal: Risk for impaired skin integrity will decrease Outcome: Progressing

## 2023-11-29 NOTE — Progress Notes (Signed)
 Discharge medications delivered to patient at the bedside in a secure bag.

## 2023-11-29 NOTE — Progress Notes (Signed)
 Progress Note  8 Days Post-Op  Subjective: Patient feeling well overall. Patient denies pain. Tolerating diet without nausea and vomiting. Had BM yesterday. Having flatulence.   ROS  All negative with the exception of above.  Objective: Vital signs in last 24 hours: Temp:  [97.9 F (36.6 C)-98.3 F (36.8 C)] 97.9 F (36.6 C) (11/17 0414) Pulse Rate:  [63-72] 66 (11/17 1120) Resp:  [16-20] 16 (11/17 0414) BP: (153-194)/(72-94) 153/73 (11/17 1120) SpO2:  [96 %-99 %] 96 % (11/17 0414) Last BM Date : 11/27/23  Intake/Output from previous day: 11/16 0701 - 11/17 0700 In: 423 [P.O.:420; I.V.:3] Out: 1700 [Urine:1700] Intake/Output this shift: Total I/O In: 243 [P.O.:240; I.V.:3] Out: 100 [Urine:100]  PE: General: Pleasant male who is sitting in chair at bedside in NAD. HEENT: Head is normocephalic, atraumatic. Heart: HR normal during encounter. Lungs: Respiratory effort nonlabored. Abd: Soft with distention. Drain of right abdomen with minimal clear drainage in bulb.  MS: Able to move all extremities.  Skin: Warm and dry.  Psych: A&Ox3 with an appropriate affect.    Lab Results:  No results for input(s): WBC, HGB, HCT, PLT in the last 72 hours. BMET Recent Labs    11/28/23 0440  NA 141  K 4.0  CL 104  CO2 25  GLUCOSE 158*  BUN 16  CREATININE 0.88  CALCIUM 8.8*   PT/INR No results for input(s): LABPROT, INR in the last 72 hours. CMP     Component Value Date/Time   NA 141 11/28/2023 0440   K 4.0 11/28/2023 0440   CL 104 11/28/2023 0440   CO2 25 11/28/2023 0440   GLUCOSE 158 (H) 11/28/2023 0440   BUN 16 11/28/2023 0440   CREATININE 0.88 11/28/2023 0440   CALCIUM 8.8 (L) 11/28/2023 0440   PROT 5.2 (L) 11/25/2023 1650   ALBUMIN 2.4 (L) 11/25/2023 1650   AST 141 (H) 11/25/2023 1650   ALT 105 (H) 11/25/2023 1650   ALKPHOS 386 (H) 11/25/2023 1650   BILITOT 0.5 11/25/2023 1650   GFRNONAA >60 11/28/2023 0440   GFRAA >60 07/27/2019 0528    Lipase     Component Value Date/Time   LIPASE 11 11/22/2023 0425       Studies/Results: No results found.  Anti-infectives: Anti-infectives (From admission, onward)    Start     Dose/Rate Route Frequency Ordered Stop   11/23/23 1400  ceFEPIme (MAXIPIME) 2 g in sodium chloride  0.9 % 100 mL IVPB        2 g 200 mL/hr over 30 Minutes Intravenous Every 8 hours 11/23/23 1021 11/26/23 2335   11/22/23 0300  ceFEPIme (MAXIPIME) 2 g in sodium chloride  0.9 % 100 mL IVPB  Status:  Discontinued        2 g 200 mL/hr over 30 Minutes Intravenous Every 24 hours 11/21/23 1323 11/23/23 1021   11/21/23 2000  metroNIDAZOLE  (FLAGYL ) IVPB 500 mg        500 mg 100 mL/hr over 60 Minutes Intravenous Every 12 hours 11/21/23 1323 11/26/23 1656   11/21/23 0300  ceFEPIme (MAXIPIME) 2 g in sodium chloride  0.9 % 100 mL IVPB  Status:  Discontinued        2 g 200 mL/hr over 30 Minutes Intravenous Every 24 hours 11/20/23 0525 11/21/23 1323   11/20/23 0800  metroNIDAZOLE  (FLAGYL ) IVPB 500 mg  Status:  Discontinued        500 mg 100 mL/hr over 60 Minutes Intravenous Every 12 hours 11/19/23 2351 11/21/23 1323  11/20/23 0100  ceFEPIme (MAXIPIME) 2 g in sodium chloride  0.9 % 100 mL IVPB  Status:  Discontinued        2 g 200 mL/hr over 30 Minutes Intravenous Every 12 hours 11/19/23 2359 11/20/23 0525   11/20/23 0045  vancomycin (VANCOREADY) IVPB 500 mg/100 mL        500 mg 100 mL/hr over 60 Minutes Intravenous  Once 11/19/23 2353 11/20/23 0144   11/19/23 1930  vancomycin (VANCOCIN) IVPB 1000 mg/200 mL premix       Placed in Followed by Linked Group   1,000 mg 200 mL/hr over 60 Minutes Intravenous  Once 11/19/23 1922 11/19/23 2223   11/19/23 1930  vancomycin (VANCOCIN) 500 mg in sodium chloride  0.9 % 100 mL IVPB  Status:  Discontinued       Placed in Followed by Linked Group   500 mg 100 mL/hr over 60 Minutes Intravenous  Once 11/19/23 1922 11/19/23 2353   11/19/23 1930  metroNIDAZOLE  (FLAGYL ) IVPB 500  mg        500 mg 100 mL/hr over 60 Minutes Intravenous  Once 11/19/23 1923 11/19/23 2223   11/19/23 1815  cefTRIAXone (ROCEPHIN) 2 g in sodium chloride  0.9 % 100 mL IVPB        2 g 200 mL/hr over 30 Minutes Intravenous  Once 11/19/23 1807 11/19/23 1929        Assessment/Plan S/p dx lap with drainage of abscesses and omental graham patch of ulcer, by Dr. Sheldon 11/9 -Afebrile. -UGI negative. NG out 11/12 and CLD started. Tolerating soft diet now without concerns.  -Reassuring abdominal exam. Drain thin serous. Will discontinue drain. -Denies pain and having bowel function.  -Continue to mobilize as tolerated. -Continue PPI at discharge. Recommend PPI for at least 3 months.  -Will arrange patient follow up with Dr. Sheldon. Stable from surgical standpoint. Please call for further questions or concerns.    FEN - Soft; Per TRH otherwise VTE - Lovenox ID - Maxipime/Flagyl  x 5 days post op   LOS: 10 days   I reviewed specialist notes, nursing notes, last 24 h vitals and pain scores, last 48 h intake and output, last 24 h labs and trends, and last 24 h imaging results.   Marjorie Carlyon Favre, [facility] Surgery 11/29/2023, 12:06 PM Please see Amion for pager number during day hours 7:00am-4:30pm

## 2023-11-29 NOTE — Discharge Summary (Signed)
 Physician Discharge Summary  Theodore Stevenson FMW:[id number] DOB: Sep 11, 1955 DOA: 11/19/2023  PCP: Patient, No Pcp Per  Admit date: 11/19/2023 Discharge date: 11/29/2023  Admitted From: Home Disposition: Home  Recommendations for Outpatient Follow-up:  Follow up with PCP in 1-2 weeks Follow-up with Dr. Malcolm neurosurgery tomorrow as scheduled Follow-up with general surgery as scheduled for postoperative care  Home Health: Home health PT Equipment/Devices: None  Discharge Condition: Stable CODE STATUS: Full Diet recommendation: As tolerated  Brief/Interim Summary: Patient is a 68 year old male with HTN, spinal stenosis, chronic back pain presented to ED with severe abdominal pain with concern over perforated gastric ulcer and abscess on imaging status post diagnostic laparoscope with drainage of abscess and omental Arlyss patch of ulcer on 11/21/2023.  Patient continues to improve daily, noted ongoing flatus and bowel movements over the past 24 hours, NG tube remains to suction with moderate output, diet being advanced per general surgery once NG output improves.  Patient admitted as above with intractable abdominal pain found to have perforated gastric ulcer with abscess on imaging status post laparoscopic evaluation drainage of abscess with omental patch.  Unfortunately postoperatively patient's mental status continued to worsen in the setting of intractable pain due to spinal stenosis and polypharmacy.  Patient's narcotics were weaned off, patient initiated on steroids per discussion with neurosurgery and drastically improved over the past 72 hours.  He is now back to baseline if not improved from prior and otherwise stable and agreeable for discharge home, general surgery has advance patient's diet without complication, abdominal drain has been removed, Foley has been removed without any complications and he is otherwise stable and agreeable for discharge home.  Of note patient has follow-up  tomorrow on the 18th for cervical decompression, he remains medically stable for this procedure from my standpoint given his procedure is minimally invasive compared to his prior exploratory laparoscopy and abscess drainage which she tolerated quite well earlier this hospital stay.  Outpatient follow-up with PCP and general surgery and neurosurgery as scheduled.  Discharge Diagnoses:  Principal Problem:   Perforated prepyloric ulcer s/p lap omental Arlyss patch/washout 11/21/2023 Active Problems:   Hypertension   AKI (acute kidney injury)   Hyperkalemia   Hyperglycemia   UTI (urinary tract infection)   PUD (peptic ulcer disease)   Perforated abdominal viscus   Acidosis, lactic   Acute metabolic encephalopathy, resolving Polypharmacy unlikely Metabolic alkalosis, iatrogenic Hypercarbia ruled out - Patient notably altered on 11/12 -now back to baseline - CT head - 11/26/2023 negative for any acute changes/findings - no clear etiology for confusion - Limit IV narcotic use in the future if possible   Micro pneumoperitoneum, perforated gastric ulcer with abscesses  -status post diagnostic laparoscopy, drainage of abscesses, omental Graham patch of ulcer per general surgery, Dr. Sheldon 11/21/2023.  - NG tube previously removed 11/13, tolerating p.o. now at baseline - Abdomen remains somewhat distended with ongoing bowel sounds, flatus and bowel movements. - JP drain removed per general surgery -completed antibiotic course - [facility] recommending outpatient follow-up after discharge, no intervention required at this time inpatient   AKI with hyperkalemia, resolved Nongapped acidosis, resolved Lactic acidosis, resolved - Renal function approaching baseline, continue to follow clinically - Urine output improving, holding IV fluids as PO intake improves - Initially required lokelma at intake - now WNL   Hypertension, POA - poorly controlled - likely secondary to prolonged NPO status and  steroids - Resume home Lisinopril and hydrochlorothiazide - follow clinicaly   Hyperglycemia without diagnosis  of DM - Hemoglobin A1c noted at 5.3.   Spinal stenosis, cervical, chronic  Ambulatory dysfunction, acute on chronic - Follow-up 11/18 with Dr Louis as scheduled. - Steroids held at discharge, defer to neurosurgical team for ongoing need for steroids in the perioperative setting.   UTI ruled out - Urinalysis concerning for UTI. - Urine cultures with <10,000 colonies   Obesity class II - Body mass index is 30.45 kg/m. Discharge Instructions  Discharge Instructions     Call MD for:  difficulty breathing, headache or visual disturbances   Complete by: As directed    Call MD for:  extreme fatigue   Complete by: As directed    Call MD for:  hives   Complete by: As directed    Call MD for:  persistant dizziness or light-headedness   Complete by: As directed    Call MD for:  persistant nausea and vomiting   Complete by: As directed    Call MD for:  redness, tenderness, or signs of infection (pain, swelling, redness, odor or green/yellow discharge around incision site)   Complete by: As directed    Call MD for:  severe uncontrolled pain   Complete by: As directed    Call MD for:  temperature >100.4   Complete by: As directed    Diet - low sodium heart healthy   Complete by: As directed    Discharge wound care:   Complete by: As directed    Cleanse L lateral buttock/hip, B elbow and L knee wounds with vashe, do not rinse and allow to air dry. Apply Xeroform gauze Soila [phone]) to wound beds daily and secure with silicone foam.  Soak dressing with normal saline if adhered to wound bed for atraumatic removal   Increase activity slowly   Complete by: As directed       Allergies as of 11/29/2023       Reactions   Codeine Nausea And Vomiting   Nsaids Other (See Comments)   H/o ulcers & poss perf = NO NSAIDs   Penicillins Other (See Comments)   Pt was young unsure  exactly Unknown        Medication List     STOP taking these medications    lisinopril-hydrochlorothiazide 20-25 MG tablet Commonly known as: ZESTORETIC       TAKE these medications    acetaminophen  325 MG tablet Commonly known as: TYLENOL  Take 2 tablets (650 mg total) by mouth every 6 (six) hours as needed for mild pain (pain score 1-3) or moderate pain (pain score 4-6).   hydrochlorothiazide 25 MG tablet Commonly known as: HYDRODIURIL Take 1 tablet (25 mg total) by mouth daily. Start taking on: November 30, 2023   lisinopril 10 MG tablet Commonly known as: ZESTRIL Take 1 tablet (10 mg total) by mouth daily. Start taking on: November 30, 2023   pantoprazole 40 MG tablet Commonly known as: PROTONIX Take 1 tablet (40 mg total) by mouth 2 (two) times daily.   traMADol 50 MG tablet Commonly known as: ULTRAM Take 0.5-1 tablets (25-50 mg total) by mouth every 12 (twelve) hours as needed for severe pain (pain score 7-10). What changed:  how much to take when to take this reasons to take this               Discharge Care Instructions  (From admission, onward)           Start     Ordered   11/29/23 0000  Discharge wound  care:       Comments: Cleanse L lateral buttock/hip, B elbow and L knee wounds with vashe, do not rinse and allow to air dry. Apply Xeroform gauze ([place] [phone]) to wound beds daily and secure with silicone foam.  Soak dressing with normal saline if adhered to wound bed for atraumatic removal   11/29/23 1535            Contact information for follow-up providers     Sheldon Standing, MD. Schedule an appointment as soon as possible for a visit on 12/20/2023.   Specialties: General Surgery, Colon and Rectal Surgery Why: 4:00pm, Arrive 30 minutes prior to your appointment time, Please bring your insurance card and photo ID Contact information: [address] [place] [address] [phone]              Contact  information for after-discharge care     Home Medical Care     [facility]) .   Service: Home Health Services Contact information: [address] [place]  [address] [phone]                    Allergies  Allergen Reactions   Codeine Nausea And Vomiting   Nsaids Other (See Comments)    H/o ulcers & poss perf = NO NSAIDs   Penicillins Other (See Comments)    Pt was young unsure exactly  Unknown    Consultations: General Surgery, GI  Procedures/Studies: CT ABDOMEN PELVIS W CONTRAST Result Date: 11/26/2023 CLINICAL DATA:  Status post repair perforated gastric ulcer on 11/21/2023. Confusion postoperatively. EXAM: CT ABDOMEN AND PELVIS WITH CONTRAST TECHNIQUE: Multidetector CT imaging of the abdomen and pelvis was performed using the standard protocol following bolus administration of intravenous contrast. RADIATION DOSE REDUCTION: This exam was performed according to the departmental dose-optimization program which includes automated exposure control, adjustment of the mA and/or kV according to patient size and/or use of iterative reconstruction technique. CONTRAST:  OMNIPAQUE  IOHEXOL  300 MG/ML  SOLN COMPARISON:  None Available. FINDINGS: Lower chest: Small bilateral pleural effusions, right greater than left. Associated right basilar atelectasis. Hepatobiliary: Stable hepatic steatosis. Unremarkable gallbladder. No biliary ductal dilatation. Pancreas: Unremarkable. No pancreatic ductal dilatation or surrounding inflammatory changes. Spleen: No splenic injury or perisplenic hematoma. Adrenals/Urinary Tract: Adrenal glands are unremarkable. Kidneys are normal, without renal calculi, focal lesion, or hydronephrosis. Bladder is unremarkable. Stomach/Bowel: No bowel obstruction or significant ileus. There is some fluid in air in the stomach. Small amount of free intraperitoneal air remains in the anterior peritoneal  cavity, likely related to recent surgery. Surgical drain terminates adjacent to the liver and just superior to the pyloric region. Vascular/Lymphatic: Atherosclerosis of the abdominal aorta without aneurysm. No lymphadenopathy identified. Reproductive: Prostate is unremarkable. Other: Scattered small amount of free fluid in the peritoneal cavity, primarily in the lower abdomen and pelvis. No focal marginated abscess identified. Bladder contains a Foley catheter. Musculoskeletal: Degenerative disc disease of the lower lumbar spine. Bilateral hip arthroplasty. IMPRESSION: 1. Small amount of free intraperitoneal air remains in the anterior peritoneal cavity, likely related to recent surgery. 2. Scattered small amount of free fluid in the peritoneal cavity, primarily in the lower abdomen and pelvis. No focal marginated abscess identified. 3. Small bilateral pleural effusions, right greater than left. Associated right basilar atelectasis. 4. Stable hepatic steatosis. 5. Aortic atherosclerosis. Electronically Signed   By: Marcey Moan M.D.   On: 11/26/2023 15:53   CT  HEAD WO CONTRAST ( ) Result Date: 11/26/2023 EXAM: CT HEAD WITHOUT CONTRAST 11/26/2023 01:41:03 PM TECHNIQUE: CT of the head was performed without the administration of intravenous contrast. Automated exposure control, iterative reconstruction, and/or weight based adjustment of the mA/kV was utilized to reduce the radiation dose to as low as reasonably achievable. COMPARISON: None available. CLINICAL HISTORY: 68 year old male. Headache, increasing frequency or severity. FINDINGS: BRAIN AND VENTRICLES: Normal brain volume for age. Mild for age mostly periventricular white matter hypodensity. Otherwise normal gray white differentiation. Normal basilar cisterns. No acute hemorrhage. No evidence of acute infarct. No hydrocephalus. No extra-axial collection. No mass effect or midline shift. Calcified atherosclerosis at the skull base. No suspicious  intracranial vascular hyperdensity. ORBITS: No acute abnormality. SINUSES: Mild to moderate bilateral paranasal sinus mucosal thickening, mild bubbly opacity associated. No sinus fluid levels. Tympanic cavities and mastoids appear clear. SOFT TISSUES AND SKULL: Mild left posterior convexity scalp soft tissue scarring. No skull fracture. IMPRESSION: 1. No acute intracranial abnormality. Mild for age cerebral white matter changes most commonly due to small vessel disease. 2. Mild to moderate bilateral paranasal sinus inflammation. Electronically signed by: Helayne Hurst MD 11/26/2023 02:07 PM EST RP Workstation: [id number]   DG UGI W SINGLE CM (SOL OR THIN BA) Result Date: 11/24/2023 CLINICAL DATA:  Postop from [facility] patch repair of perforated gastric ulcer. Evaluate for postop leak. EXAM: WATER SOLUBLE UPPER GI SERIES WITH KUB TECHNIQUE: Single-column upper GI series was performed using 200 mL water soluble Omnipaque  300 contrast. Radiation Exposure Index (as provided by the fluoroscopic device): 65.2 mGy Kerma COMPARISON:  None Available. FINDINGS: Scout radiograph shows a nasogastric tube, with tip in the gastric antrum. Right abdominal surgical drain also seen. Bowel gas pattern is normal. Single contrast upper GI series was performed during administration water-soluble contrast through the patient's existing nasogastric tube. There is no evidence of contrast leak or extravasation from the stomach or duodenum. Wall irregularity and luminal narrowing is seen involving the distal gastric antrum and duodenum bulb, most likely due to postop edema at site of ulcer repair. No evidence of gastric outlet or duodenum obstruction. IMPRESSION: No evidence of postop contrast leak or obstruction. Luminal narrowing and wall irregularity of the distal gastric antrum and duodenum bulb, most likely due to postop edema. Electronically Signed   By: Norleen DELENA Kil M.D.   On: 11/24/2023 11:45   DG Abd Portable 1 View Result Date:  11/19/2023 EXAM: 1 VIEW XRAY OF THE ABDOMEN 11/19/2023 08:37:00 PM COMPARISON: None available. CLINICAL HISTORY: NGT placement. FINDINGS: LINES, TUBES AND DEVICES: A gastric catheter is noted in the distal stomach. BOWEL: No free air is seen. BONES: No acute osseous abnormality. IMPRESSION: 1. Gastric catheter in the distal stomach. 2. No free intraperitoneal air. Electronically signed by: Oneil Devonshire MD 11/19/2023 08:41 PM EST RP Workstation: [id number]   CT ABDOMEN PELVIS WO CONTRAST Result Date: 11/19/2023 EXAM: CT ABDOMEN AND PELVIS WITHOUT CONTRAST 11/19/2023 06:08:57 PM TECHNIQUE: CT of the abdomen and pelvis was performed without the administration of intravenous contrast. Multiplanar reformatted images are provided for review. Automated exposure control, iterative reconstruction, and/or weight-based adjustment of the mA/kV was utilized to reduce the radiation dose to as low as reasonably achievable. COMPARISON: CT 07/24/2019 CLINICAL HISTORY: Abdominal pain, acute, nonlocalized. FINDINGS: LOWER CHEST: No acute abnormality. LIVER: Small collection of gas adjacent to the falciform ligament of the liver on image 30/2. Small amount of gas along the capsule of the liver on image 26. GALLBLADDER AND BILE DUCTS: Gallbladder is unremarkable.  No biliary ductal dilatation. SPLEEN: No acute abnormality. PANCREAS: No acute abnormality. ADRENAL GLANDS: No acute abnormality. KIDNEYS, URETERS AND BLADDER: No stones in the kidneys or ureters. No hydronephrosis. No perinephric or periureteral stranding. Bladder poorly evaluated due to streak artifact from the bilateral hip prosthetics. GI AND BOWEL: Stomach demonstrates no acute abnormality. There is no bowel obstruction. There is a small collection of gas positioned between the first portion of the duodenum and the left hepatic lobe on image 30 of series 2. This is seen on coronal image 70 of series 8. There are multiple diverticula of the sigmoid colon but no evidence  of acute inflammation or perforation. PERITONEUM AND RETROPERITONEUM: There is a small amount of intraperitoneal free air collecting predominantly in the upper abdomen along the ventral peritoneum. For example, small collection of gas adjacent to the falciform ligament of the liver on image 30/2. Small amount of gas along the capsule of the liver on image 26. There is a small collection of gas positioned between the first portion of the duodenum and the left hepatic lobe on image 30 of series 2. This is seen on coronal image 70 of series 8. There is mild inflammatory stranding in the right upper quadrant. A small amount of fluid along the right anterior pararenal fascia. There is trace intraperitoneal free fluid in the pelvis on the right on image 69/2. VASCULATURE: Aorta is normal in caliber. LYMPH NODES: No lymphadenopathy. REPRODUCTIVE ORGANS: No acute abnormality. BONES AND SOFT TISSUES: No acute osseous abnormality. No focal soft tissue abnormality. Small amount of intraperitoneal free air predominantly in the upper abdomen with mild inflammatory stranding in the right upper quadrant. Findings are consistent with perforation of the hollow viscus. Most likely source would be proximal duodenum related peptic ulcer disease. Secondary source although less likely would be perforated sigmoid diverticulum. Free fluid in the pelvis. Recommend emergent surgical consultation. Findings conveyed to ordering physician Ruthe, MD at time of interpretation. IMPRESSION: 1. Small amount of intraperitoneal free air predominantly in the upper abdomen with mild inflammatory stranding in the right upper quadrant, consistent with hollow viscus perforation. Most likely source is proximal duodenum (peptic ulcer disease). A perforated sigmoid diverticulum is less likely. 2. Trace intraperitoneal free fluid in the pelvis. 3. Recommend emergent surgical consultation. 4. Findings conveyed to ordering physician Ruthe, MD at time of  interpretation. Electronically signed by: Norleen Boxer MD 11/19/2023 07:02 PM EST RP Workstation: [id number]     Subjective: No acute issues or events overnight denies nausea vomiting diarrhea constipation headache fever chills or chest pain   Discharge Exam: Vitals:   11/29/23 1120 11/29/23 1309  BP: (!) 153/73 (!) 176/80  Pulse: 66 64  Resp:  18  Temp:  97.7 F (36.5 C)  SpO2:  99%   Vitals:   11/29/23 0414 11/29/23 0939 11/29/23 1120 11/29/23 1309  BP: (!) 175/75 (!) 181/72 (!) 153/73 (!) 176/80  Pulse: 72 63 66 64  Resp: 16   18  Temp: 97.9 F (36.6 C)   97.7 F (36.5 C)  TempSrc:    Oral  SpO2: 96%   99%  Weight:      Height:        General: Pt is alert, awake, not in acute distress Cardiovascular: RRR, S1/S2 +, no rubs, no gallops Respiratory: CTA bilaterally, no wheezing, no rhonchi Abdominal: Soft, nontender moderately distended, nontympanic Extremities: no edema, no cyanosis    The results of significant diagnostics from this hospitalization (including imaging, microbiology, ancillary and  laboratory) are listed below for reference.     Microbiology: Recent Results (from the past 240 hours)  Urine Culture     Status: Abnormal   Collection Time: 11/19/23  6:00 PM   Specimen: Urine, Clean Catch  Result Value Ref Range Status   Specimen Description   Final    URINE, CLEAN CATCH Performed at [facility], [address] [place], [place] [address]    Special Requests   Final    NONE Performed at [facility], [address] [place], [place] [address]    Culture (A)  Final    <10,000 COLONIES/mL INSIGNIFICANT GROWTH Performed at [facility], [address], [place], [place] [address]    Report Status 11/20/2023 FINAL  Final  Blood culture (routine x 2)     Status: None   Collection Time: 11/19/23  7:35 PM   Specimen: BLOOD  Result Value Ref Range Status   Specimen Description   Final    BLOOD RIGHT  ANTECUBITAL Performed at [facility], [address] [place], [place] [address]    Special Requests   Final    BOTTLES DRAWN AEROBIC AND ANAEROBIC Blood Culture adequate volume Performed at [facility], [address] [place], [place] [address]    Culture   Final    NO GROWTH 5 DAYS Performed at [facility], [address] [place], [place] [address]    Report Status 11/24/2023 FINAL  Final  Blood culture (routine x 2)     Status: None   Collection Time: 11/19/23  7:45 PM   Specimen: BLOOD  Result Value Ref Range Status   Specimen Description   Final    BLOOD LEFT ANTECUBITAL Performed at [facility], [address] [place], [place] [address]    Special Requests   Final    BOTTLES DRAWN AEROBIC AND ANAEROBIC Blood Culture adequate volume Performed at [facility], [address] [place], [place] [address]    Culture   Final    NO GROWTH 5 DAYS Performed at [facility], [address], [place], [place] [address]    Report Status 11/24/2023 FINAL  Final     Labs: BNP (last 3 results) No results for input(s): BNP in the last 8760 hours. Basic Metabolic Panel: Recent Labs  Lab 11/23/23 0449 11/24/23 0417 11/24/23 2125 11/25/23 0425 11/25/23 1650 11/26/23 0415 11/28/23 0440  NA 141   < > 140 141 140 141 141  K 3.2*   < > 4.2 4.0 3.5 3.3* 4.0  CL 104   < > 98 99 99 99 104  CO2 28   < > 33* 35* 33* 33* 25  GLUCOSE 175*   < > 106* 81 100* 90 158*  BUN 32*   < > 23 23 25* 26* 16  CREATININE 1.07   < > 1.08 1.17 1.29* 1.29* 0.88  CALCIUM 7.6*   < > 8.0* 8.1* 8.3* 8.2* 8.8*  MG 2.0  --   --   --   --   --   --   PHOS 2.1*  --   --   --   --   --   --    < > = values in this interval not displayed.   Liver Function Tests: Recent Labs  Lab 11/23/23 0449 11/25/23 1650  AST 29 141*  ALT 35  105*  ALKPHOS 87 386*  BILITOT 0.3 0.5  PROT 4.8* 5.2*  ALBUMIN 2.3* 2.4*   No results for input(s): LIPASE,  AMYLASE in the last 168 hours. Recent Labs  Lab 11/25/23 1650  AMMONIA 17   CBC: Recent Labs  Lab 11/23/23 0449 11/24/23 1003 11/26/23 0415  WBC 6.1 6.7 9.7  HGB 9.0* 10.2* 9.6*  HCT 28.4* 32.2* 29.8*  MCV 101.1* 100.6* 99.0  PLT 153 166 229   Cardiac Enzymes: No results for input(s): CKTOTAL, CKMB, CKMBINDEX, TROPONINI in the last 168 hours. BNP: Invalid input(s): POCBNP CBG: Recent Labs  Lab 11/28/23 2030 11/28/23 2354 11/29/23 0412 11/29/23 0807 11/29/23 1149  GLUCAP 212* 155* 139* 117* 136*   D-Dimer No results for input(s): DDIMER in the last 72 hours. Hgb A1c No results for input(s): HGBA1C in the last 72 hours. Lipid Profile No results for input(s): CHOL, HDL, LDLCALC, TRIG, CHOLHDL, LDLDIRECT in the last 72 hours. Thyroid function studies No results for input(s): TSH, T4TOTAL, T3FREE, THYROIDAB in the last 72 hours.  Invalid input(s): FREET3 Anemia work up No results for input(s): VITAMINB12, FOLATE, FERRITIN, TIBC, IRON, RETICCTPCT in the last 72 hours. Urinalysis    Component Value Date/Time   COLORURINE YELLOW 11/19/2023 1616   APPEARANCEUR CLOUDY (A) 11/19/2023 1616   LABSPEC 1.025 11/19/2023 1616   PHURINE 5.0 11/19/2023 1616   GLUCOSEU NEGATIVE 11/19/2023 1616   HGBUR NEGATIVE 11/19/2023 1616   BILIRUBINUR SMALL (A) 11/19/2023 1616   KETONESUR 15 (A) 11/19/2023 1616   PROTEINUR 30 (A) 11/19/2023 1616   UROBILINOGEN 0.2 06/24/2011 1047   NITRITE POSITIVE (A) 11/19/2023 1616   LEUKOCYTESUR TRACE (A) 11/19/2023 1616   Sepsis Labs Recent Labs  Lab 11/23/23 0449 11/24/23 1003 11/26/23 0415  WBC 6.1 6.7 9.7   Microbiology Recent Results (from the past 240 hours)  Urine Culture     Status: Abnormal   Collection Time: 11/19/23  6:00 PM   Specimen: Urine, Clean Catch  Result Value Ref Range Status   Specimen Description   Final    URINE, CLEAN CATCH Performed at [facility],  [address] [place], [place] [address]    Special Requests   Final    NONE Performed at [facility], [address] [place], [place] [address]    Culture (A)  Final    <10,000 COLONIES/mL INSIGNIFICANT GROWTH Performed at [facility], [address], [place], [place] [address]    Report Status 11/20/2023 FINAL  Final  Blood culture (routine x 2)     Status: None   Collection Time: 11/19/23  7:35 PM   Specimen: BLOOD  Result Value Ref Range Status   Specimen Description   Final    BLOOD RIGHT ANTECUBITAL Performed at [facility], [address] [place], [place] [address]    Special Requests   Final    BOTTLES DRAWN AEROBIC AND ANAEROBIC Blood Culture adequate volume Performed at [facility], [address] [place], [place] [address]    Culture   Final    NO GROWTH 5 DAYS Performed at [facility], [address] [place], [place] [address]    Report Status 11/24/2023 FINAL  Final  Blood culture (routine x 2)     Status: None   Collection Time: 11/19/23  7:45 PM   Specimen: BLOOD  Result Value Ref Range Status   Specimen Description   Final  BLOOD LEFT ANTECUBITAL Performed at [facility], [address] [place], [place] [address]    Special Requests   Final    BOTTLES DRAWN AEROBIC AND ANAEROBIC Blood Culture adequate volume Performed at [facility], [address] [place], [place] [address]    Culture   Final    NO GROWTH 5 DAYS Performed at [facility], [address] [place], [place] [address]    Report Status 11/24/2023 FINAL  Final     Time coordinating discharge: Over 30 minutes  SIGNED:   Elsie JAYSON Montclair, DO [facility] 11/29/2023, 3:35 PM Pager   If 7PM-7AM, please contact night-coverage [email or web address]

## 2023-11-29 NOTE — Progress Notes (Incomplete)
 Called patient to confirm his knowledge of surgery and arrival time tomorrow.  Left voicemail with callback number

## 2023-11-29 NOTE — Anesthesia Preprocedure Evaluation (Signed)
 Anesthesia Evaluation  Patient identified by MRN, date of birth, ID band Patient awake    Reviewed: Allergy & Precautions, NPO status , Patient's Chart, lab work & pertinent test results  Airway Mallampati: III  TM Distance: >3 FB Neck ROM: Limited    Dental  (+) Dental Advisory Given, Poor Dentition   Pulmonary former smoker   Pulmonary exam normal breath sounds clear to auscultation       Cardiovascular hypertension, Pt. on medications Normal cardiovascular exam Rhythm:Regular Rate:Normal     Neuro/Psych Stenosis of cervical spine with myelopathy  Neuromuscular disease  negative psych ROS   GI/Hepatic Neg liver ROS, PUD,GERD  Medicated,,  Endo/Other  obesity  Renal/GU negative Renal ROS     Musculoskeletal  (+) Arthritis ,    Abdominal   Peds  Hematology  (+) Blood dyscrasia, anemia   Anesthesia Other Findings   Reproductive/Obstetrics                              Anesthesia Physical Anesthesia Plan  ASA: 2  Anesthesia Plan: General   Post-op Pain Management: Tylenol  PO (pre-op)*   Induction: Intravenous  PONV Risk Score and Plan: 2 and Dexamethasone and Ondansetron   Airway Management Planned: Oral ETT and Video Laryngoscope Planned  Additional Equipment:   Intra-op Plan:   Post-operative Plan: Extubation in OR  Informed Consent: I have reviewed the patients History and Physical, chart, labs and discussed the procedure including the risks, benefits and alternatives for the proposed anesthesia with the patient or authorized representative who has indicated his/her understanding and acceptance.     Dental advisory given  Plan Discussed with: CRNA  Anesthesia Plan Comments:          Anesthesia Quick Evaluation

## 2023-11-29 NOTE — Progress Notes (Signed)
 Occupational Therapy Treatment Patient Details Name: Theodore Stevenson MRN: [id number] DOB: 09/18/1955 Today's Date: 11/29/2023   History of present illness Patient is a 68 year old male who presented to the ED with c/o severe abdominal pain.  Dx with perforated prepyloric ulcer with abscesses and AKI.  Patient underwent lap procedure with drainage and Graham patch & washout on 11/21/23.  PMHx includes HTN, hepatitis, Hx of smoking, OA, spinal stenosis, left low back pain with sciatica, bilateral THA   OT comments  Pt eager to go home. Demonstrated bed mobility and ambulation with RW at a CGA level. Pt is impulsive with decreased safety awareness, family endorses impaired memory. Wife assists with LB dressing as needed at baseline. Pt and family educated in cervical precautions in preparation for impending surgery. Continue to recommending HHOT.       If plan is discharge home, recommend the following:  A lot of help with bathing/dressing/bathroom;Assistance with cooking/housework;Assist for transportation;Help with stairs or ramp for entrance;A little help with walking and/or transfers;Direct supervision/assist for medications management;Direct supervision/assist for financial management   Equipment Recommendations       Recommendations for Other Services      Precautions / Restrictions Precautions Precautions: Fall Recall of Precautions/Restrictions: Impaired Precaution/Restrictions Comments: neck surgery scheduled for 11/30/23 Restrictions Weight Bearing Restrictions Per Provider Order: No       Mobility Bed Mobility Overal bed mobility: Needs Assistance Bed Mobility: Rolling, Sidelying to Sit, Sit to Sidelying Rolling: Contact guard assist Sidelying to sit: Contact guard assist     Sit to sidelying: Contact guard assist      Transfers Overall transfer level: Needs assistance Equipment used: Rolling walker (2 wheels) Transfers: Sit to/from Stand Sit to Stand: Contact  guard assist           General transfer comment: cues for hand placement     Balance Overall balance assessment: Needs assistance   Sitting balance-Leahy Scale: Good     Standing balance support: Bilateral upper extremity supported, Reliant on assistive device for balance, During functional activity Standing balance-Leahy Scale: Poor Standing balance comment: reliant on RW                           ADL either performed or assessed with clinical judgement   ADL Overall ADL's : Needs assistance/impaired     Grooming: Set up;Sitting           Upper Body Dressing : Set up;Sitting     Lower Body Dressing Details (indicate cue type and reason): wife helps at baseline Toilet Transfer: Contact guard assist;Ambulation;Rolling walker (2 wheels)   Toileting- Clothing Manipulation and Hygiene: Contact guard assist;Sit to/from stand       Functional mobility during ADLs: Set up;Rolling walker (2 wheels)      Extremity/Trunk Assessment              Vision       Perception     Praxis     Communication Communication Communication: No apparent difficulties   Cognition Arousal: Alert Behavior During Therapy: Impulsive Cognition: Cognition impaired     Awareness: Intellectual awareness impaired, Online awareness impaired Memory impairment (select all impairments): Short-term memory                       Following commands: Intact Following commands impaired: Follows one step commands with increased time      Cueing   Cueing Techniques: Verbal cues, Gestural  cues  Exercises      Shoulder Instructions       General Comments      Pertinent Vitals/ Pain       Pain Assessment Pain Assessment: No/denies pain  Home Living                                          Prior Functioning/Environment              Frequency  Min 2X/week        Progress Toward Goals  OT Goals(current goals can now be found in  the care plan section)  Progress towards OT goals: Progressing toward goals  Acute Rehab OT Goals OT Goal Formulation: With patient/family Time For Goal Achievement: 12/07/23 Potential to Achieve Goals: Good  Plan      Co-evaluation                 AM-PAC OT 6 Clicks Daily Activity     Outcome Measure   Help from another person eating meals?: None Help from another person taking care of personal grooming?: A Little Help from another person toileting, which includes using toliet, bedpan, or urinal?: A Little Help from another person bathing (including washing, rinsing, drying)?: A Lot Help from another person to put on and taking off regular upper body clothing?: A Little Help from another person to put on and taking off regular lower body clothing?: A Lot 6 Click Score: 17    End of Session Equipment Utilized During Treatment: Rolling walker (2 wheels);Gait belt  OT Visit Diagnosis: Unsteadiness on feet (R26.81);Other symptoms and signs involving cognitive function   Activity Tolerance Patient tolerated treatment well   Patient Left in bed;with call bell/phone within reach;with family/visitor present;with nursing/sitter in room   Nurse Communication Mobility status        Time: 8456-8395 OT Time Calculation (min): 21 min  Charges: OT General Charges $OT Visit: 1 Visit OT Treatments $Therapeutic Activity: 8-22 mins  Theodore Stevenson, OTR/L Acute Rehabilitation Services Office: [phone]   Theodore Stevenson 11/29/2023, 4:10 PM

## 2023-11-30 ENCOUNTER — Ambulatory Visit (HOSPITAL_COMMUNITY): Payer: Self-pay | Admitting: Anesthesiology

## 2023-11-30 ENCOUNTER — Encounter (HOSPITAL_COMMUNITY): Payer: Self-pay | Admitting: Neurosurgery

## 2023-11-30 ENCOUNTER — Ambulatory Visit (HOSPITAL_COMMUNITY)
Admission: RE | Disposition: A | Discharge status: Home / Self Care | Payer: Self-pay | Source: Home / Self Care | Attending: Neurosurgery

## 2023-11-30 ENCOUNTER — Observation Stay (HOSPITAL_COMMUNITY)
Admission: RE | Admit: 2023-11-30 | Discharge: 2023-11-30 | Disposition: A | Discharge status: Home / Self Care | Attending: Neurosurgery | Admitting: Neurosurgery

## 2023-11-30 ENCOUNTER — Ambulatory Visit (HOSPITAL_COMMUNITY)

## 2023-11-30 DIAGNOSIS — Z87891 Personal history of nicotine dependence: Secondary | ICD-10-CM | POA: Insufficient documentation

## 2023-11-30 DIAGNOSIS — I10 Essential (primary) hypertension: Secondary | ICD-10-CM | POA: Diagnosis not present

## 2023-11-30 DIAGNOSIS — M4722 Other spondylosis with radiculopathy, cervical region: Secondary | ICD-10-CM | POA: Insufficient documentation

## 2023-11-30 DIAGNOSIS — M4802 Spinal stenosis, cervical region: Secondary | ICD-10-CM | POA: Diagnosis not present

## 2023-11-30 DIAGNOSIS — Z96642 Presence of left artificial hip joint: Secondary | ICD-10-CM | POA: Diagnosis not present

## 2023-11-30 DIAGNOSIS — M4712 Other spondylosis with myelopathy, cervical region: Secondary | ICD-10-CM | POA: Diagnosis not present

## 2023-11-30 DIAGNOSIS — M5001 Cervical disc disorder with myelopathy,  high cervical region: Secondary | ICD-10-CM | POA: Diagnosis present

## 2023-11-30 HISTORY — PX: ANTERIOR CERVICAL DECOMP/DISCECTOMY FUSION: SHX1161

## 2023-11-30 LAB — SURGICAL PCR SCREEN
MRSA, PCR: NEGATIVE
Staphylococcus aureus: NEGATIVE

## 2023-11-30 SURGERY — ANTERIOR CERVICAL DECOMPRESSION/DISCECTOMY FUSION 1 LEVEL
Anesthesia: General | Site: Spine Cervical

## 2023-11-30 MED ORDER — ONDANSETRON HCL 4 MG/2ML IJ SOLN: 4.0000 mg | Freq: Once | INTRAMUSCULAR | Status: DC | PRN

## 2023-11-30 MED ORDER — ONDANSETRON HCL 4 MG PO TABS: 4.0000 mg | ORAL_TABLET | Freq: Four times a day (QID) | ORAL | Status: DC | PRN

## 2023-11-30 MED ORDER — CYCLOBENZAPRINE HCL 10 MG PO TABS: 10.0000 mg | ORAL_TABLET | Freq: Three times a day (TID) | ORAL | 0 refills | Status: AC | PRN

## 2023-11-30 MED ORDER — LIDOCAINE 2% (20 MG/ML) 5 ML SYRINGE
INTRAMUSCULAR | Status: AC
Start: 2023-11-30 — End: 2023-11-30
  Filled 2023-11-30: qty 5

## 2023-11-30 MED ORDER — THROMBIN 5000 UNITS EX KIT
PACK | CUTANEOUS | Status: AC
  Filled 2023-11-30: qty 2

## 2023-11-30 MED ORDER — MENTHOL 3 MG MT LOZG: 1.0000 | LOZENGE | OROMUCOSAL | Status: DC | PRN

## 2023-11-30 MED ORDER — ONDANSETRON HCL 4 MG/2ML IJ SOLN
INTRAMUSCULAR | Status: AC
  Filled 2023-11-30: qty 2

## 2023-11-30 MED ORDER — CEFAZOLIN SODIUM-DEXTROSE 2-4 GM/100ML-% IV SOLN
2.0000 g | INTRAVENOUS | Status: AC
  Administered 2023-11-30: 2 g via INTRAVENOUS
  Filled 2023-11-30: qty 100

## 2023-11-30 MED ORDER — HYDROCHLOROTHIAZIDE 25 MG PO TABS
25.0000 mg | ORAL_TABLET | Freq: Every day | ORAL | Status: DC
  Administered 2023-11-30: 25 mg via ORAL
  Filled 2023-11-30: qty 1

## 2023-11-30 MED ORDER — LISINOPRIL 10 MG PO TABS
10.0000 mg | ORAL_TABLET | Freq: Every day | ORAL | Status: DC
  Administered 2023-11-30: 10 mg via ORAL
  Filled 2023-11-30: qty 1

## 2023-11-30 MED ORDER — GLYCOPYRROLATE PF 0.2 MG/ML IJ SOSY
PREFILLED_SYRINGE | INTRAMUSCULAR | Status: DC | PRN
Start: 2023-11-30 — End: 2023-11-30
  Administered 2023-11-30: .1 mg via INTRAVENOUS

## 2023-11-30 MED ORDER — FENTANYL CITRATE (PF) 100 MCG/2ML IJ SOLN
INTRAMUSCULAR | Status: AC
  Filled 2023-11-30: qty 2

## 2023-11-30 MED ORDER — PROPOFOL 10 MG/ML IV BOLUS
INTRAVENOUS | Status: DC | PRN
  Administered 2023-11-30: 100 mg via INTRAVENOUS
  Administered 2023-11-30: 125 ug/kg/min via INTRAVENOUS

## 2023-11-30 MED ORDER — SODIUM CHLORIDE 0.9% FLUSH
3.0000 mL | Freq: Two times a day (BID) | INTRAVENOUS | Status: DC
Start: 2023-11-30 — End: 2023-11-30
  Administered 2023-11-30: 3 mL via INTRAVENOUS

## 2023-11-30 MED ORDER — LACTATED RINGERS IV SOLN: INTRAVENOUS | Status: DC

## 2023-11-30 MED ORDER — ACETAMINOPHEN 325 MG PO TABS: 650.0000 mg | ORAL_TABLET | ORAL | Status: DC | PRN

## 2023-11-30 MED ORDER — SODIUM CHLORIDE 0.9 % IV SOLN: INTRAVENOUS | Status: DC | PRN

## 2023-11-30 MED ORDER — 0.9 % SODIUM CHLORIDE (POUR BTL) OPTIME
TOPICAL | Status: DC | PRN
  Administered 2023-11-30: 1000 mL

## 2023-11-30 MED ORDER — PROPOFOL 10 MG/ML IV BOLUS
INTRAVENOUS | Status: AC
Start: 2023-11-30 — End: 2023-11-30
  Filled 2023-11-30: qty 20

## 2023-11-30 MED ORDER — PHENYLEPHRINE HCL-NACL 20-0.9 MG/250ML-% IV SOLN
INTRAVENOUS | Status: DC | PRN
  Administered 2023-11-30: 20 ug/min via INTRAVENOUS

## 2023-11-30 MED ORDER — LIDOCAINE 2% (20 MG/ML) 5 ML SYRINGE
INTRAMUSCULAR | Status: DC | PRN
  Administered 2023-11-30: 80 mg via INTRAVENOUS

## 2023-11-30 MED ORDER — CYCLOBENZAPRINE HCL 10 MG PO TABS: 10.0000 mg | ORAL_TABLET | Freq: Three times a day (TID) | ORAL | Status: DC | PRN

## 2023-11-30 MED ORDER — PHENOL 1.4 % MT LIQD
1.0000 | OROMUCOSAL | Status: DC | PRN
Start: 2023-11-30 — End: 2023-11-30

## 2023-11-30 MED ORDER — DEXAMETHASONE SOD PHOSPHATE PF 10 MG/ML IJ SOLN
INTRAMUSCULAR | Status: DC | PRN
  Administered 2023-11-30: 10 mg via INTRAVENOUS

## 2023-11-30 MED ORDER — FENTANYL CITRATE (PF) 250 MCG/5ML IJ SOLN
INTRAMUSCULAR | Status: DC | PRN
  Administered 2023-11-30 (×2): 50 ug via INTRAVENOUS

## 2023-11-30 MED ORDER — HYDROCODONE-ACETAMINOPHEN 10-325 MG PO TABS: 1.0000 | ORAL_TABLET | ORAL | Status: DC | PRN

## 2023-11-30 MED ORDER — CHLORHEXIDINE GLUCONATE CLOTH 2 % EX PADS: 6.0000 | MEDICATED_PAD | Freq: Once | CUTANEOUS | Status: DC

## 2023-11-30 MED ORDER — CHLORHEXIDINE GLUCONATE CLOTH 2 % EX PADS
6.0000 | MEDICATED_PAD | Freq: Once | CUTANEOUS | Status: AC
  Administered 2023-11-30: 6 via TOPICAL

## 2023-11-30 MED ORDER — ROCURONIUM BROMIDE 10 MG/ML (PF) SYRINGE
PREFILLED_SYRINGE | INTRAVENOUS | Status: DC | PRN
Start: 2023-11-30 — End: 2023-11-30
  Administered 2023-11-30: 50 mg via INTRAVENOUS
  Administered 2023-11-30: 10 mg via INTRAVENOUS

## 2023-11-30 MED ORDER — THROMBIN (RECOMBINANT) 5000 UNITS EX SOLR
CUTANEOUS | Status: DC | PRN
  Administered 2023-11-30: 1

## 2023-11-30 MED ORDER — PROPOFOL 10 MG/ML IV BOLUS
INTRAVENOUS | Status: AC
  Filled 2023-11-30: qty 20

## 2023-11-30 MED ORDER — SODIUM CHLORIDE 0.9% FLUSH: 3.0000 mL | INTRAVENOUS | Status: DC | PRN

## 2023-11-30 MED ORDER — ACETAMINOPHEN 500 MG PO TABS
1000.0000 mg | ORAL_TABLET | Freq: Once | ORAL | Status: DC
  Filled 2023-11-30: qty 2

## 2023-11-30 MED ORDER — ACETAMINOPHEN 650 MG RE SUPP: 650.0000 mg | RECTAL | Status: DC | PRN

## 2023-11-30 MED ORDER — HYDROCODONE-ACETAMINOPHEN 5-325 MG PO TABS: 1.0000 | ORAL_TABLET | ORAL | 0 refills | Status: AC | PRN

## 2023-11-30 MED ORDER — CEFAZOLIN SODIUM-DEXTROSE 1-4 GM/50ML-% IV SOLN
1.0000 g | Freq: Three times a day (TID) | INTRAVENOUS | Status: DC
  Administered 2023-11-30: 1 g via INTRAVENOUS
  Filled 2023-11-30: qty 50

## 2023-11-30 MED ORDER — ORAL CARE MOUTH RINSE: 15.0000 mL | Freq: Once | OROMUCOSAL | Status: AC

## 2023-11-30 MED ORDER — ONDANSETRON HCL 4 MG/2ML IJ SOLN
INTRAMUSCULAR | Status: DC | PRN
  Administered 2023-11-30: 4 mg via INTRAVENOUS

## 2023-11-30 MED ORDER — ONDANSETRON HCL 4 MG/2ML IJ SOLN: 4.0000 mg | Freq: Four times a day (QID) | INTRAMUSCULAR | Status: DC | PRN

## 2023-11-30 MED ORDER — CHLORHEXIDINE GLUCONATE 0.12 % MT SOLN
15.0000 mL | Freq: Once | OROMUCOSAL | Status: AC
  Administered 2023-11-30: 15 mL via OROMUCOSAL
  Filled 2023-11-30: qty 15

## 2023-11-30 MED ORDER — FENTANYL CITRATE (PF) 100 MCG/2ML IJ SOLN
25.0000 ug | INTRAMUSCULAR | Status: DC | PRN
  Administered 2023-11-30 (×2): 25 ug via INTRAVENOUS

## 2023-11-30 MED ORDER — SODIUM CHLORIDE 0.9 % IV SOLN
250.0000 mL | INTRAVENOUS | Status: DC
  Administered 2023-11-30: 250 mL via INTRAVENOUS

## 2023-11-30 MED ORDER — ROCURONIUM BROMIDE 10 MG/ML (PF) SYRINGE
PREFILLED_SYRINGE | INTRAVENOUS | Status: AC
  Filled 2023-11-30: qty 10

## 2023-11-30 MED ORDER — PANTOPRAZOLE SODIUM 40 MG PO TBEC: 40.0000 mg | DELAYED_RELEASE_TABLET | Freq: Two times a day (BID) | ORAL | Status: DC

## 2023-11-30 MED ORDER — HYDROCODONE-ACETAMINOPHEN 5-325 MG PO TABS: 1.0000 | ORAL_TABLET | ORAL | Status: DC | PRN

## 2023-11-30 MED ORDER — SUGAMMADEX SODIUM 200 MG/2ML IV SOLN
INTRAVENOUS | Status: DC | PRN
  Administered 2023-11-30: 200 mg via INTRAVENOUS

## 2023-11-30 SURGICAL SUPPLY — 44 items
BAG COUNTER SPONGE SURGICOUNT (BAG) ×1 IMPLANT
BAND RUBBER #18 3X1/16 STRL (MISCELLANEOUS) ×2 IMPLANT
BENZOIN TINCTURE PRP APPL 2/3 (GAUZE/BANDAGES/DRESSINGS) ×1 IMPLANT
BIT DRILL 13 (BIT) IMPLANT
BUR MATCHSTICK NEURO 3.0 LAGG (BURR) ×1 IMPLANT
CANISTER SUCTION 3000ML PPV (SUCTIONS) ×1 IMPLANT
DRAPE C-ARM 42X72 X-RAY (DRAPES) ×2 IMPLANT
DRAPE LAPAROTOMY 100X72 PEDS (DRAPES) ×1 IMPLANT
DRAPE MICROSCOPE LEICA (MISCELLANEOUS) ×1 IMPLANT
DURAPREP 6ML APPLICATOR 50/CS (WOUND CARE) ×1 IMPLANT
ELECT COATED BLADE 2.86 ST (ELECTRODE) ×1 IMPLANT
ELECTRODE REM PT RTRN 9FT ADLT (ELECTROSURGICAL) ×1 IMPLANT
GAUZE 4X4 16PLY ~~LOC~~+RFID DBL (SPONGE) IMPLANT
GAUZE SPONGE 4X4 12PLY STRL (GAUZE/BANDAGES/DRESSINGS) ×1 IMPLANT
GLOVE BIO SURGEON STRL SZ 6.5 (GLOVE) ×1 IMPLANT
GLOVE BIOGEL PI IND STRL 6.5 (GLOVE) ×1 IMPLANT
GLOVE ECLIPSE 9.0 STRL (GLOVE) ×1 IMPLANT
GOWN STRL REUS W/ TWL LRG LVL3 (GOWN DISPOSABLE) IMPLANT
GOWN STRL REUS W/ TWL XL LVL3 (GOWN DISPOSABLE) IMPLANT
GOWN STRL REUS W/TWL 2XL LVL3 (GOWN DISPOSABLE) IMPLANT
HALTER HD/CHIN CERV TRACTION D (MISCELLANEOUS) ×1 IMPLANT
HEMOSTAT POWDER KIT SURGIFOAM (HEMOSTASIS) IMPLANT
KIT BASIN OR (CUSTOM PROCEDURE TRAY) ×1 IMPLANT
KIT TURNOVER KIT B (KITS) ×1 IMPLANT
NDL SPNL 20GX3.5 QUINCKE YW (NEEDLE) ×1 IMPLANT
NEEDLE SPNL 20GX3.5 QUINCKE YW (NEEDLE) ×1 IMPLANT
PACK LAMINECTOMY NEURO (CUSTOM PROCEDURE TRAY) ×1 IMPLANT
PAD ARMBOARD POSITIONER FOAM (MISCELLANEOUS) ×3 IMPLANT
PENCIL BUTTON HOLSTER BLD 10FT (ELECTRODE) ×1 IMPLANT
PLATE 23MM (Plate) IMPLANT
SCREW ST 13X4XST VA NS SPNE (Screw) IMPLANT
SOLN 0.9% NACL POUR BTL 1000ML (IV SOLUTION) ×1 IMPLANT
SOLN STERILE WATER BTL 1000 ML (IV SOLUTION) ×1 IMPLANT
SPACER SPNL 11X14X7XPEEK CVD (Cage) IMPLANT
SPONGE INTESTINAL PEANUT (DISPOSABLE) ×1 IMPLANT
SPONGE SURGIFOAM ABS GEL SZ50 (HEMOSTASIS) ×1 IMPLANT
STRIP CLOSURE SKIN 1/2X4 (GAUZE/BANDAGES/DRESSINGS) ×1 IMPLANT
SUT VIC AB 3-0 SH 8-18 (SUTURE) ×1 IMPLANT
SUT VIC AB 4-0 RB1 18 (SUTURE) ×1 IMPLANT
TAPE CLOTH 4X10 WHT NS (GAUZE/BANDAGES/DRESSINGS) ×1 IMPLANT
TAPE CLOTH SURG 4X10 WHT LF (GAUZE/BANDAGES/DRESSINGS) IMPLANT
TOWEL GREEN STERILE (TOWEL DISPOSABLE) ×1 IMPLANT
TOWEL GREEN STERILE FF (TOWEL DISPOSABLE) ×1 IMPLANT
TRAP SPECIMEN MUCUS 40CC (MISCELLANEOUS) ×1 IMPLANT

## 2023-11-30 NOTE — Discharge Summary (Signed)
 Physician Discharge Summary  Patient ID: Theodore Stevenson MRN: [id number] DOB/AGE: [birth year] 68 y.o.  Admit date: 11/30/2023 Discharge date: 11/30/2023  Admission Diagnoses:  Discharge Diagnoses:  Principal Problem:   Cervical spondylosis with myelopathy and radiculopathy   Discharged Condition: good  Hospital Course: Patient admitted to the hospital where underwent uncomplicated C3-4 anterior cervical tenectomy and fusion.  Postoperative doing very well.  Preoperative pain numbness and weakness resolved.  Standing ambulating and voiding without difficulty.  Ready for discharge home. Consults:   Significant Diagnostic Studies:   Treatments:   Discharge Exam: Blood pressure (!) 189/69, pulse 66, temperature 97.8 F (36.6 C), resp. rate 17, height 5' 5 (1.651 m), weight 83 kg, SpO2 97%. Awake and alert.  Oriented and appropriate.  Motor sensor function intact.  Wound clean and dry.  Chest and abdomen benign.  Disposition: Discharge disposition: 01-Home or Self Care        Allergies as of 11/30/2023       Reactions   Codeine Nausea And Vomiting   Nsaids Other (See Comments)   H/o ulcers & poss perf = NO NSAIDs   Penicillins Other (See Comments)   Pt was young unsure exactly Unknown        Medication List     TAKE these medications    acetaminophen  325 MG tablet Commonly known as: TYLENOL  Take 2 tablets (650 mg total) by mouth every 6 (six) hours as needed for mild pain (pain score 1-3) or moderate pain (pain score 4-6).   cyclobenzaprine  10 MG tablet Commonly known as: FLEXERIL  Take 1 tablet (10 mg total) by mouth 3 (three) times daily as needed for muscle spasms.   hydrochlorothiazide 25 MG tablet Commonly known as: HYDRODIURIL Take 1 tablet (25 mg total) by mouth daily.   HYDROcodone-acetaminophen  5-325 MG tablet Commonly known as: NORCO/VICODIN Take 1 tablet by mouth every 4 (four) hours as needed for moderate pain (pain score 4-6) ((score 4 to  6)).   lisinopril 10 MG tablet Commonly known as: ZESTRIL Take 1 tablet (10 mg total) by mouth daily.   pantoprazole 40 MG tablet Commonly known as: PROTONIX Take 1 tablet (40 mg total) by mouth 2 (two) times daily.   traMADol 50 MG tablet Commonly known as: ULTRAM Take 0.5-1 tablets (25-50 mg total) by mouth every 12 (twelve) hours as needed for severe pain (pain score 7-10).        Follow-up Information     Theodore Shove, MD Follow up.   Specialty: Neurosurgery Contact information: [address] [place] [address] [phone]                 Signed: Shove [name] 11/30/2023, 5:17 PM

## 2023-11-30 NOTE — Plan of Care (Signed)
 Problem: Education: Goal: Knowledge of General Education information will improve Description: Including pain rating scale, medication(s)/side effects and non-pharmacologic comfort measures 11/30/2023 1811 by Sherleen Flor, RN Outcome: Completed/Met 11/30/2023 1404 by Sherleen Flor, RN Outcome: Progressing   Problem: Health Behavior/Discharge Planning: Goal: Ability to manage health-related needs will improve 11/30/2023 1811 by Sherleen Flor, RN Outcome: Completed/Met 11/30/2023 1404 by Sherleen Flor, RN Outcome: Progressing   Problem: Clinical Measurements: Goal: Ability to maintain clinical measurements within normal limits will improve 11/30/2023 1811 by Sherleen Flor, RN Outcome: Completed/Met 11/30/2023 1404 by Sherleen Flor, RN Outcome: Progressing Goal: Will remain free from infection 11/30/2023 1811 by Sherleen Flor, RN Outcome: Completed/Met 11/30/2023 1404 by Sherleen Flor, RN Outcome: Progressing Goal: Diagnostic test results will improve 11/30/2023 1811 by Sherleen Flor, RN Outcome: Completed/Met 11/30/2023 1404 by Sherleen Flor, RN Outcome: Progressing Goal: Respiratory complications will improve 11/30/2023 1811 by Sherleen Flor, RN Outcome: Completed/Met 11/30/2023 1404 by Sherleen Flor, RN Outcome: Progressing Goal: Cardiovascular complication will be avoided 11/30/2023 1811 by Sherleen Flor, RN Outcome: Completed/Met 11/30/2023 1404 by Sherleen Flor, RN Outcome: Progressing   Problem: Activity: Goal: Risk for activity intolerance will decrease 11/30/2023 1811 by Sherleen Flor, RN Outcome: Completed/Met 11/30/2023 1404 by Sherleen Flor, RN Outcome: Progressing   Problem: Nutrition: Goal: Adequate nutrition will be maintained 11/30/2023 1811 by Sherleen Flor, RN Outcome: Completed/Met 11/30/2023 1404 by Sherleen Flor, RN Outcome: Progressing   Problem: Coping: Goal: Level of anxiety will  decrease 11/30/2023 1811 by Sherleen Flor, RN Outcome: Completed/Met 11/30/2023 1404 by Sherleen Flor, RN Outcome: Progressing   Problem: Elimination: Goal: Will not experience complications related to bowel motility 11/30/2023 1811 by Sherleen Flor, RN Outcome: Completed/Met 11/30/2023 1404 by Sherleen Flor, RN Outcome: Progressing Goal: Will not experience complications related to urinary retention 11/30/2023 1811 by Sherleen Flor, RN Outcome: Completed/Met 11/30/2023 1404 by Sherleen Flor, RN Outcome: Progressing   Problem: Pain Managment: Goal: General experience of comfort will improve and/or be controlled 11/30/2023 1811 by Sherleen Flor, RN Outcome: Completed/Met 11/30/2023 1404 by Sherleen Flor, RN Outcome: Progressing   Problem: Safety: Goal: Ability to remain free from injury will improve 11/30/2023 1811 by Sherleen Flor, RN Outcome: Completed/Met 11/30/2023 1404 by Sherleen Flor, RN Outcome: Progressing   Problem: Skin Integrity: Goal: Risk for impaired skin integrity will decrease 11/30/2023 1811 by Sherleen Flor, RN Outcome: Completed/Met 11/30/2023 1404 by Sherleen Flor, RN Outcome: Progressing   Problem: Education: Goal: Ability to verbalize activity precautions or restrictions will improve 11/30/2023 1811 by Sherleen Flor, RN Outcome: Completed/Met 11/30/2023 1404 by Sherleen Flor, RN Outcome: Progressing Goal: Knowledge of the prescribed therapeutic regimen will improve 11/30/2023 1811 by Sherleen Flor, RN Outcome: Completed/Met 11/30/2023 1404 by Sherleen Flor, RN Outcome: Progressing Goal: Understanding of discharge needs will improve 11/30/2023 1811 by Sherleen Flor, RN Outcome: Completed/Met 11/30/2023 1404 by Sherleen Flor, RN Outcome: Progressing   Problem: Activity: Goal: Ability to avoid complications of mobility impairment will improve 11/30/2023 1811 by Sherleen Flor,  RN Outcome: Completed/Met 11/30/2023 1404 by Sherleen Flor, RN Outcome: Progressing Goal: Ability to tolerate increased activity will improve 11/30/2023 1811 by Sherleen Flor, RN Outcome: Completed/Met 11/30/2023 1404 by Sherleen Flor, RN Outcome: Progressing Goal: Will remain free from falls 11/30/2023 1811 by Sherleen Flor, RN Outcome: Completed/Met 11/30/2023 1404 by Sherleen Flor, RN Outcome: Progressing   Problem: Bowel/Gastric: Goal: Gastrointestinal status for postoperative course will improve 11/30/2023 1811 by Sherleen Flor, RN Outcome: Completed/Met 11/30/2023 1404 by Sherleen Flor, RN Outcome: Progressing   Problem: Clinical Measurements: Goal: Ability to  maintain clinical measurements within normal limits will improve 11/30/2023 1811 by Sherleen Flor, RN Outcome: Completed/Met 11/30/2023 1404 by Sherleen Flor, RN Outcome: Progressing Goal: Postoperative complications will be avoided or minimized 11/30/2023 1811 by Sherleen Flor, RN Outcome: Completed/Met 11/30/2023 1404 by Sherleen Flor, RN Outcome: Progressing Goal: Diagnostic test results will improve 11/30/2023 1811 by Sherleen Flor, RN Outcome: Completed/Met 11/30/2023 1404 by Sherleen Flor, RN Outcome: Progressing   Problem: Pain Management: Goal: Pain level will decrease 11/30/2023 1811 by Sherleen Flor, RN Outcome: Completed/Met 11/30/2023 1404 by Sherleen Flor, RN Outcome: Progressing   Problem: Skin Integrity: Goal: Will show signs of wound healing 11/30/2023 1811 by Sherleen Flor, RN Outcome: Completed/Met 11/30/2023 1404 by Sherleen Flor, RN Outcome: Progressing   Problem: Health Behavior/Discharge Planning: Goal: Identification of resources available to assist in meeting health care needs will improve 11/30/2023 1811 by Sherleen Flor, RN Outcome: Completed/Met 11/30/2023 1404 by Sherleen Flor, RN Outcome: Progressing   Problem:  Bladder/Genitourinary: Goal: Urinary functional status for postoperative course will improve 11/30/2023 1811 by Sherleen Flor, RN Outcome: Completed/Met 11/30/2023 1404 by Sherleen Flor, RN Outcome: Progressing

## 2023-11-30 NOTE — Evaluation (Signed)
 Occupational Therapy Evaluation Patient Details Name: Theodore Stevenson MRN: [id number] DOB: 1955/08/26 Today's Date: 11/30/2023   History of Present Illness   Patient is a 68 year old male who presented for scheduled ACDF.  Recent admit to [facility] for perforated prepyloric ulcer with abscesses and AKI.  Patient underwent lap procedure with drainage and Graham patch & washout on 11/21/23.  PMHx includes HTN, hepatitis, Hx of smoking, OA, spinal stenosis, left low back pain with sciatica, bilateral THA     Clinical Impressions Patient admitted for the procedure above.  PTA, with a recent hospitalization, patient was needing Min A for ADL completion and close supervision for mobility at RW level.  Currently he is moving his L arm and leg better, still struggling a little with coordination, and a little unsteady without the RW.  Patient with good understanding of precautions, able to complete LB ADL, climb 3 stairs, and mobilize in the hallway with RW.  Patient will have assist as needed at home.  No further OT needs in the acute setting, recommend follow up with MD as prescribed.       If plan is discharge home, recommend the following:   A little help with walking and/or transfers;A little help with bathing/dressing/bathroom;Assist for transportation;Assistance with cooking/housework     Functional Status Assessment   Patient has not had a recent decline in their functional status     Equipment Recommendations   None recommended by OT     Recommendations for Other Services         Precautions/Restrictions   Precautions Precautions: Fall;Cervical Precaution Booklet Issued: Yes (comment) Recall of Precautions/Restrictions: Impaired Precaution/Restrictions Comments: cues from OT and family Required Braces or Orthoses: Cervical Brace Cervical Brace: Soft collar;For comfort Restrictions Weight Bearing Restrictions Per Provider Order: No     Mobility Bed Mobility Overal bed  mobility: Needs Assistance Bed Mobility: Sidelying to Sit, Sit to Sidelying   Sidelying to sit: Min assist     Sit to sidelying: Min assist      Transfers Overall transfer level: Needs assistance Equipment used: Rolling walker (2 wheels) Transfers: Sit to/from Stand, Bed to chair/wheelchair/BSC Sit to Stand: Supervision     Step pivot transfers: Supervision            Balance Overall balance assessment: Needs assistance Sitting-balance support: Bilateral upper extremity supported, Feet supported Sitting balance-Leahy Scale: Good     Standing balance support: Reliant on assistive device for balance Standing balance-Leahy Scale: Fair                             ADL either performed or assessed with clinical judgement   ADL       Grooming: Supervision/safety;Standing           Upper Body Dressing : Set up;Sitting   Lower Body Dressing: Minimal assistance;Sit to/from stand   Toilet Transfer: Supervision/safety;Regular Toilet;Rolling walker (2 wheels);Ambulation                   Vision Patient Visual Report: No change from baseline Vision Assessment?: No apparent visual deficits     Perception Perception: Not tested       Praxis Praxis: Not tested       Pertinent Vitals/Pain Pain Assessment Pain Assessment: Faces Faces Pain Scale: Hurts a little bit Pain Location: abdomen Pain Descriptors / Indicators: Aching, Sore, Pressure Pain Intervention(s): Monitored during session     Extremity/Trunk Assessment Upper Extremity  Assessment Upper Extremity Assessment: Generalized weakness;RUE deficits/detail LUE Deficits / Details: Shoulder flexion AROM 85 deg; gross strength 4-/5 LUE Sensation: decreased light touch LUE Coordination: decreased fine motor   Lower Extremity Assessment Lower Extremity Assessment: Overall WFL for tasks assessed   Cervical / Trunk Assessment Cervical / Trunk Assessment: Kyphotic   Communication  Communication Communication: No apparent difficulties Factors Affecting Communication: Reduced clarity of speech   Cognition Arousal: Alert Behavior During Therapy: WFL for tasks assessed/performed Cognition: No apparent impairments                               Following commands: Intact Following commands impaired: Only follows one step commands consistently     Cueing  General Comments   Cueing Techniques: Verbal cues   VSS on RA   Exercises     Shoulder Instructions      Home Living Family/patient expects to be discharged to:: Private residence Living Arrangements: Spouse/significant other;Children Available Help at Discharge: Family;Available 24 hours/day Type of Home: House Home Access: Stairs to enter [employer] of Steps: 2 Entrance Stairs-Rails: None Home Layout: Multi-level;Able to live on main level with bedroom/bathroom;Full bath on main level Alternate Level Stairs-Number of Steps: Flight   Bathroom Shower/Tub: Chief Strategy Officer: Standard Bathroom Accessibility: Yes How Accessible: Accessible via walker Home Equipment: Agricultural Consultant (2 wheels);Cane - single point;BSC/3in1;Adaptive equipment Adaptive Equipment: Reacher;Sock aid Additional Comments: Patient has 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 ADLs Comments: Grossly independent; no problem with tub or toilet transfers at baseline.    OT Problem List: Decreased strength;Decreased range of motion;Decreased activity tolerance;Impaired balance (sitting and/or standing)   OT Treatment/Interventions:        OT Goals(Current goals can be found in the care plan section)   Acute Rehab  OT Goals Patient Stated Goal: Return home OT Goal Formulation: With patient Time For Goal Achievement: 12/02/23 Potential to Achieve Goals: Good   OT Frequency:       Co-evaluation              AM-PAC OT 6 Clicks Daily Activity     Outcome Measure Help from another person eating meals?: None Help from another person taking care of personal grooming?: None Help from another person toileting, which includes using toliet, bedpan, or urinal?: A Little Help from another person bathing (including washing, rinsing, drying)?: A Little Help from another person to put on and taking off regular upper body clothing?: A Little Help from another person to put on and taking off regular lower body clothing?: A Little 6 Click Score: 20   End of Session Equipment Utilized During Treatment: Rolling walker (2 wheels);Gait belt Nurse Communication: Mobility status  Activity Tolerance: Patient tolerated treatment well Patient left: in bed;with call bell/phone within reach;with family/visitor present  OT Visit Diagnosis: Unsteadiness on feet (R26.81)                Time: 8484-8454 OT Time Calculation (min): 30 min Charges:  OT General Charges $OT Visit: 1 Visit OT Evaluation $OT Eval Moderate Complexity: 1 Mod OT Treatments $Self Care/Home Management : 8-22 mins  11/30/2023  RP, OTR/L  Acute Rehabilitation Services  Office:  [phone]   Theodore Stevenson 11/30/2023, 3:54 PM

## 2023-11-30 NOTE — Brief Op Note (Signed)
 11/30/2023  9:35 AM  PATIENT:  Theodore Stevenson  68 y.o. male  PRE-OPERATIVE DIAGNOSIS:  Stenosis of cervical spine with myelopathy  POST-OPERATIVE DIAGNOSIS:  Stenosis of cervical spine with myelopathy  PROCEDURE:  Procedure(s): ANTERIOR CERVICAL DECOMPRESSION/DISCECTOMY FUSION CERVICAL THREE-FOUR (N/A)  SURGEON:  Surgeons and Role:    DEWAINE Louis Shove, MD - Primary  PHYSICIAN ASSISTANT:   ASSISTANTSBETHA Jennetta PIETY   ANESTHESIA:   general  EBL:  25 mL   BLOOD ADMINISTERED:none  DRAINS: none   LOCAL MEDICATIONS USED:  NONE  SPECIMEN:  No Specimen  DISPOSITION OF SPECIMEN:  N/A  COUNTS:  YES  TOURNIQUET:  * No tourniquets in log *  DICTATION: .Dragon Dictation  PLAN OF CARE: Admit for overnight observation  PATIENT DISPOSITION:  PACU - hemodynamically stable.   Delay start of Pharmacological VTE agent (>24hrs) due to surgical blood loss or risk of bleeding: yes

## 2023-11-30 NOTE — Progress Notes (Signed)
 Patient alert and oriented, mae's well, voiding adequate amount of urine, swallowing without difficulty, no c/o pain at time of discharge. Patient discharged home with wife. Script and discharged instructions given to patient. Patient and wife stated understanding of instructions given. Room was checked and accounted for all patient's belongings; discharge instructions concerning his medications, incision care, follow up appointment and when to call the doctor as needed were all discussed with patient by RN and he and wife expressed understanding on the instructions given.

## 2023-11-30 NOTE — Op Note (Signed)
 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  Surgeon:Idara Woodside A.Retha Bither, M.D.  Asst. Surgeon: Jennetta, NP  Assistant utilized for exposure, decompression, fusion, instrumentation and closure  Anesthesia: General  Indication: 68 year old male with bilateral upper extremity numbness paresthesias and significant left upper and left lower extremity weakness.  Workup demonstrates evidence of marked cervical spondylosis with severe stenosis with marked cord compression and cord signal change at C3-4.  Patient presents now for C3-4 anterior cervical discectomy and fusion in hopes improving his symptoms.  Operative note: After induction of anesthesia, patient positioned supine with neck slightly extended and held placeholder traction.  Patient's anterior cervical region prepped and draped sterilely.  Incision made overlying C3-4.  Dissection performed on the right.  Retractor placed.  Fluoroscopy used.  Levels confirmed.  Disc base incised.  Discectomy then performed using various instruments down to level posterior annulus.  Microscope was then brought to the field used throughout the remainder of the discectomy.  Remaining aspects of annulus and osteophytes removed using high-speed drill down to the level of the posterior logical ligament.  Posterior logical was elevated and resected in a piecemeal fashion.  Underlying thecal sac was identified.  A wide central decompression then performed undercutting the bodies of C3 and C4.  Decompression then proceeded into each neural foramina.  Wide anterior foraminotomies were then performed course the exiting C4 nerve roots bilaterally.  At this point a very thorough decompression been  achieved.  There was no evidence of injury to the thecal sac or nerve roots.  Wound was then irrigated.  Gelfoam was placed topically for hemostasis then removed..  7 mm Medtronic anatomic peek cage was packed with locally harvested autograft.  This was impacted into place and recessed slightly from the anterior cortical margins of C3 and C4.  23 mm Atlantis anterior cervical weight was then placed through the C3 and C4 levels.  This injection fluoroscopic guidance using 13 mm variable angle screws to each of both levels.  All 4 screws given the final tightening and found to be solidly within the bones.  Locking screws engaged in both levels.  Final images reveal good position of the cage and the hardware with proper level with normal alignment of spine.  Wound was inspected for hemostasis.  Wound was then closed in layers with Vicryl sutures.  Steri-Strips and sterile dressing were applied.  No apparent complications.  Patient tolerated procedure well and he returns to the recovery room postop.

## 2023-11-30 NOTE — Plan of Care (Signed)

## 2023-11-30 NOTE — Anesthesia Procedure Notes (Signed)
 Procedure Name: Intubation Date/Time: 11/30/2023 8:12 AM  Performed by: Christopher Comings, CRNAPre-anesthesia Checklist: Patient identified, Emergency Drugs available, Suction available and Patient being monitored Patient Re-evaluated:Patient Re-evaluated prior to induction Oxygen Delivery Method: Circle system utilized Preoxygenation: Pre-oxygenation with 100% oxygen Induction Type: IV induction Ventilation: Mask ventilation without difficulty Laryngoscope Size: Glidescope and 3 Grade View: Grade I Tube type: Oral Tube size: 7.5 mm Number of attempts: 1 Airway Equipment and Method: Oral airway and Rigid stylet Placement Confirmation: ETT inserted through vocal cords under direct vision, positive ETCO2 and breath sounds checked- equal and bilateral Secured at: 23 cm Tube secured with: Tape Dental Injury: Teeth and Oropharynx as per pre-operative assessment

## 2023-11-30 NOTE — Progress Notes (Signed)
 Orthopedic Tech Progress Note Patient Details:  Theodore Stevenson 1955/01/31 [id number] Placed soft cerval collar on patient. Well tolerated  Ortho Devices Type of Ortho Device: Soft collar Ortho Device/Splint Interventions: Ordered, Application, Adjustment   Post Interventions Patient Tolerated: Well Instructions Provided: Adjustment of device  [name] December 11/30/2023, 10:00 AM

## 2023-11-30 NOTE — H&P (Signed)
 Theodore Stevenson is an 68 y.o. male.   Chief Complaint: Weakness HPI: 68 year old male with left upper and left lower extremity numbness paresthesias and weakness with some right upper extremity paresthesias and numbness as well.  Workup demonstrates evidence of severe cervical spondylosis with marked cervical stenosis with cord signal changes C3-4.  Patient presents now for C3-4 anterior cervical discectomy and fusion in hopes improving his symptoms.  Past Medical History:  Diagnosis Date   Arthritis    Hepatitis    1973   Hypertension    Spinal stenosis     Past Surgical History:  Procedure Laterality Date   BACK SURGERY     1987   CARPAL TUNNEL RELEASE     right and left   HIP SURGERY  06/30/2011   total left hip   JOINT REPLACEMENT     right total hip   LAPAROSCOPY  11/21/2023   Procedure: LAPAROSCOPY, DIAGNOSTIC;  Surgeon: Sheldon Standing, MD;  Location: [facility];  Service: General;;  drainage of abscess; omental graham patch   TOTAL HIP ARTHROPLASTY  06/30/2011   Procedure: TOTAL HIP ARTHROPLASTY;  Surgeon: Maude LELON Right, MD;  Location: [facility] OR;  Service: Orthopedics;  Laterality: Left;    History reviewed. No pertinent family history. Social History:  reports that he quit smoking about 17 years ago. His smoking use included cigarettes. He started smoking about 27 years ago. He has a 10 pack-year smoking history. His smokeless tobacco use includes chew. He reports that he does not drink alcohol and does not use drugs.  Allergies:  Allergies  Allergen Reactions   Codeine Nausea And Vomiting   Nsaids Other (See Comments)    H/o ulcers & poss perf = NO NSAIDs   Penicillins Other (See Comments)    Pt was young unsure exactly  Unknown    Medications Prior to Admission  Medication Sig Dispense Refill   acetaminophen  (TYLENOL ) 325 MG tablet Take 2 tablets (650 mg total) by mouth every 6 (six) hours as needed for mild pain (pain score 1-3) or moderate pain (pain score 4-6).      hydrochlorothiazide (HYDRODIURIL) 25 MG tablet Take 1 tablet (25 mg total) by mouth daily. 30 tablet 0   lisinopril (ZESTRIL) 10 MG tablet Take 1 tablet (10 mg total) by mouth daily. 30 tablet 0   pantoprazole (PROTONIX) 40 MG tablet Take 1 tablet (40 mg total) by mouth 2 (two) times daily. 60 tablet 2   traMADol (ULTRAM) 50 MG tablet Take 0.5-1 tablets (25-50 mg total) by mouth every 12 (twelve) hours as needed for severe pain (pain score 7-10). 15 tablet 0    Results for orders placed or performed during the hospital encounter of 11/19/23 (from the past 48 hours)  Glucose, capillary     Status: Abnormal   Collection Time: 11/28/23 11:13 AM  Result Value Ref Range   Glucose-Capillary 174 (H) 70 - 99 mg/dL    Comment: Glucose reference range applies only to samples taken after fasting for at least 8 hours.  Glucose, capillary     Status: Abnormal   Collection Time: 11/28/23  4:47 PM  Result Value Ref Range   Glucose-Capillary 162 (H) 70 - 99 mg/dL    Comment: Glucose reference range applies only to samples taken after fasting for at least 8 hours.  Glucose, capillary     Status: Abnormal   Collection Time: 11/28/23  8:30 PM  Result Value Ref Range   Glucose-Capillary 212 (H) 70 - 99  mg/dL    Comment: Glucose reference range applies only to samples taken after fasting for at least 8 hours.  Glucose, capillary     Status: Abnormal   Collection Time: 11/28/23 11:54 PM  Result Value Ref Range   Glucose-Capillary 155 (H) 70 - 99 mg/dL    Comment: Glucose reference range applies only to samples taken after fasting for at least 8 hours.  Glucose, capillary     Status: Abnormal   Collection Time: 11/29/23  4:12 AM  Result Value Ref Range   Glucose-Capillary 139 (H) 70 - 99 mg/dL    Comment: Glucose reference range applies only to samples taken after fasting for at least 8 hours.  Glucose, capillary     Status: Abnormal   Collection Time: 11/29/23  8:07 AM  Result Value Ref Range    Glucose-Capillary 117 (H) 70 - 99 mg/dL    Comment: Glucose reference range applies only to samples taken after fasting for at least 8 hours.  Glucose, capillary     Status: Abnormal   Collection Time: 11/29/23 11:49 AM  Result Value Ref Range   Glucose-Capillary 136 (H) 70 - 99 mg/dL    Comment: Glucose reference range applies only to samples taken after fasting for at least 8 hours.  Glucose, capillary     Status: Abnormal   Collection Time: 11/29/23  3:52 PM  Result Value Ref Range   Glucose-Capillary 217 (H) 70 - 99 mg/dL    Comment: Glucose reference range applies only to samples taken after fasting for at least 8 hours.   No results found.  Pertinent items noted in HPI and remainder of comprehensive ROS otherwise negative.  Blood pressure (!) 152/78, pulse 70, temperature 98.5 F (36.9 C), temperature source Oral, resp. rate 18, height 5' 5 (1.651 m), weight 83 kg, SpO2 97%.  Patient is awake and alert.  He is oriented and appropriate.  Speech is fluent.  Judgment and insight are intact.  Cranial nerve function normal bilaterally motor examination reveals intact motor strength in his right upper extremity.  Left upper extremity with weakness in his left deltoid and biceps muscle groups grading out at 4-/5.  He has mild weakness of his left wrist extensors and triceps muscles graded at 4/5.  He has mild weakness in his left grip and intrinsics.  Left lower extremity strength with 4/5 strength.  Right lower extremity strength normal.  Examination head ears eyes nose and throat is unremarkable chest and abdomen are benign.  Extremities are free of major deformity. Assessment/Plan Cervical stenosis with myelopathy.  Plan C3-4 anterior cervical discectomy and fusion with interbody cage, allograft and autograft, and anterior plate instrumentation.  Risks and benefits been explained.  Patient wishes to proceed.  Victory A Damyn Weitzel 11/30/2023, 7:57 AM

## 2023-11-30 NOTE — Transfer of Care (Signed)
 Immediate Anesthesia Transfer of Care Note  Patient: Theodore Stevenson  Procedure(s) Performed: ANTERIOR CERVICAL DECOMPRESSION/DISCECTOMY FUSION CERVICAL THREE-FOUR (Spine Cervical)  Patient Location: PACU  Anesthesia Type:General  Level of Consciousness: awake, drowsy, and patient cooperative  Airway & Oxygen Therapy: Patient connected to face mask oxygen  Post-op Assessment: Report given to RN, Post -op Vital signs reviewed and stable, and Patient moving all extremities X 4  Post vital signs: Reviewed and stable  Last Vitals:  Vitals Value Taken Time  BP 188/69 11/30/23 09:43  Temp    Pulse 73 11/30/23 09:45  Resp 27 11/30/23 09:45  SpO2 95 % 11/30/23 09:45  Vitals shown include unfiled device data.  Last Pain:  Vitals:   11/30/23 0654  TempSrc:   PainSc: 0-No pain         Complications: There were no known notable events for this encounter.

## 2023-11-30 NOTE — Discharge Instructions (Addendum)
 Wound Care Keep incision covered and dry until post op day 3. You may remove the gauze dressing on post op day 3. Leave steri-strips on neck.  They will fall off by themselves. Do not put any creams, lotions, or ointments on incision. You are fine to shower. Let water run over incision and pat dry.   Activity Walk each and every day, increasing distance each day. No lifting greater than 8 lbs.  No lifting no bending no twisting no driving . 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   Diet Resume your normal diet.   Call Your Doctor If Any of These Occur Redness, drainage, or swelling at the wound.  Temperature greater than 101 degrees. Severe pain not relieved by pain medication. Incision starts to come apart.  Follow Up Appt Call [phone] if you have one or any problem.

## 2023-11-30 NOTE — Anesthesia Postprocedure Evaluation (Signed)
 Anesthesia Post Note  Patient: GAHEL SAFLEY  Procedure(s) Performed: ANTERIOR CERVICAL DECOMPRESSION/DISCECTOMY FUSION CERVICAL THREE-FOUR (Spine Cervical)     Patient location during evaluation: PACU Anesthesia Type: General Level of consciousness: awake and alert Pain management: pain level controlled Vital Signs Assessment: post-procedure vital signs reviewed and stable Respiratory status: spontaneous breathing, nonlabored ventilation and respiratory function stable Cardiovascular status: blood pressure returned to baseline and stable Postop Assessment: no apparent nausea or vomiting Anesthetic complications: no   There were no known notable events for this encounter.  Last Vitals:  Vitals:   11/30/23 0601 11/30/23 0945  BP: (!) 152/78 (!) 166/69  Pulse: 70 73  Resp: 18 (!) 22  Temp: 36.9 C 36.6 C  SpO2: 97% 95%    Last Pain:  Vitals:   11/30/23 0956  TempSrc:   PainSc: 4     LLE Motor Response: Purposeful movement;Responds to commands (11/30/23 0945) LLE Sensation: Full sensation (11/30/23 0945) RLE Motor Response: Purposeful movement;Responds to commands (11/30/23 0945) RLE Sensation: Full sensation (11/30/23 0945)      Garnette FORBES [place]illern

## 2023-12-01 ENCOUNTER — Other Ambulatory Visit (HOSPITAL_COMMUNITY): Payer: Self-pay

## 2023-12-01 ENCOUNTER — Encounter (HOSPITAL_COMMUNITY): Payer: Self-pay | Admitting: Neurosurgery

## 2023-12-01 MED FILL — Thrombin For Soln 5000 Unit: CUTANEOUS | Qty: 2 | Status: AC

## 2023-12-05 ENCOUNTER — Other Ambulatory Visit: Payer: Self-pay

## 2023-12-05 ENCOUNTER — Inpatient Hospital Stay (HOSPITAL_COMMUNITY)
Admission: EM | Admit: 2023-12-05 | Discharge: 2023-12-11 | DRG: 862 | Disposition: A | Discharge status: Home Health | Attending: Internal Medicine | Admitting: Internal Medicine

## 2023-12-05 DIAGNOSIS — K255 Chronic or unspecified gastric ulcer with perforation: Secondary | ICD-10-CM | POA: Diagnosis present

## 2023-12-05 DIAGNOSIS — M4712 Other spondylosis with myelopathy, cervical region: Secondary | ICD-10-CM | POA: Diagnosis present

## 2023-12-05 DIAGNOSIS — M4722 Other spondylosis with radiculopathy, cervical region: Secondary | ICD-10-CM | POA: Diagnosis present

## 2023-12-05 DIAGNOSIS — R509 Fever, unspecified: Secondary | ICD-10-CM

## 2023-12-05 DIAGNOSIS — K219 Gastro-esophageal reflux disease without esophagitis: Secondary | ICD-10-CM | POA: Diagnosis present

## 2023-12-05 DIAGNOSIS — Z87891 Personal history of nicotine dependence: Secondary | ICD-10-CM

## 2023-12-05 DIAGNOSIS — R4182 Altered mental status, unspecified: Principal | ICD-10-CM | POA: Diagnosis present

## 2023-12-05 DIAGNOSIS — L02211 Cutaneous abscess of abdominal wall: Secondary | ICD-10-CM | POA: Diagnosis present

## 2023-12-05 DIAGNOSIS — Z981 Arthrodesis status: Secondary | ICD-10-CM

## 2023-12-05 DIAGNOSIS — I10 Essential (primary) hypertension: Secondary | ICD-10-CM | POA: Diagnosis present

## 2023-12-05 DIAGNOSIS — Z885 Allergy status to narcotic agent status: Secondary | ICD-10-CM

## 2023-12-05 DIAGNOSIS — R1310 Dysphagia, unspecified: Secondary | ICD-10-CM | POA: Diagnosis present

## 2023-12-05 DIAGNOSIS — Z8619 Personal history of other infectious and parasitic diseases: Secondary | ICD-10-CM

## 2023-12-05 DIAGNOSIS — M4802 Spinal stenosis, cervical region: Secondary | ICD-10-CM | POA: Diagnosis present

## 2023-12-05 DIAGNOSIS — G8929 Other chronic pain: Secondary | ICD-10-CM | POA: Diagnosis present

## 2023-12-05 DIAGNOSIS — Z880 Allergy status to penicillin: Secondary | ICD-10-CM

## 2023-12-05 DIAGNOSIS — T8149XA Infection following a procedure, other surgical site, initial encounter: Secondary | ICD-10-CM | POA: Diagnosis present

## 2023-12-05 DIAGNOSIS — Z886 Allergy status to analgesic agent status: Secondary | ICD-10-CM

## 2023-12-05 DIAGNOSIS — Z79899 Other long term (current) drug therapy: Secondary | ICD-10-CM

## 2023-12-05 DIAGNOSIS — M5442 Lumbago with sciatica, left side: Secondary | ICD-10-CM | POA: Diagnosis present

## 2023-12-05 DIAGNOSIS — T8143XA Infection following a procedure, organ and space surgical site, initial encounter: Principal | ICD-10-CM | POA: Diagnosis present

## 2023-12-05 DIAGNOSIS — K651 Peritoneal abscess: Secondary | ICD-10-CM | POA: Diagnosis present

## 2023-12-05 DIAGNOSIS — Z96642 Presence of left artificial hip joint: Secondary | ICD-10-CM | POA: Diagnosis present

## 2023-12-05 DIAGNOSIS — K257 Chronic gastric ulcer without hemorrhage or perforation: Secondary | ICD-10-CM | POA: Diagnosis present

## 2023-12-05 LAB — CBC WITH DIFFERENTIAL/PLATELET

## 2023-12-05 LAB — PROTIME-INR
INR: 1.1 (ref 0.8–1.2)
Prothrombin Time: 14.8 s (ref 11.4–15.2)

## 2023-12-05 NOTE — ED Provider Notes (Signed)
 [place] EMERGENCY DEPARTMENT AT [facility] Provider Note   CSN: [id number] Arrival date & time: 12/05/23  2305     Patient presents with: Abdominal Pain   Theodore Stevenson is a 68 y.o. male.  {Add pertinent medical, surgical, social history, OB history to HPI:32947} 68 yo M with a cc of abdominal drainage.  This has been going on for a couple days.  Patient had a recent abdominal surgery for perforated gastric ulcer.  Went to the bathroom and came back to the bed and found the sheets all wet.  No vomiting, no diarrhea.     Abdominal Pain      Prior to Admission medications   Medication Sig Start Date End Date Taking? Authorizing Provider  acetaminophen  (TYLENOL ) 325 MG tablet Take 2 tablets (650 mg total) by mouth every 6 (six) hours as needed for mild pain (pain score 1-3) or moderate pain (pain score 4-6). 11/29/23   Tammy Sor, PA-C  cyclobenzaprine  (FLEXERIL ) 10 MG tablet Take 1 tablet (10 mg total) by mouth 3 (three) times daily as needed for muscle spasms. 11/30/23   Louis Shove, MD  hydrochlorothiazide  (HYDRODIURIL ) 25 MG tablet Take 1 tablet (25 mg total) by mouth daily. 11/30/23   Lue Elsie BROCKS, MD  HYDROcodone -acetaminophen  (NORCO/VICODIN) 5-325 MG tablet Take 1 tablet by mouth every 4 (four) hours as needed for moderate pain (pain score 4-6) ((score 4 to 6)). 11/30/23   Louis Shove, MD  lisinopril  (ZESTRIL ) 10 MG tablet Take 1 tablet (10 mg total) by mouth daily. 11/30/23   Lue Elsie BROCKS, MD  pantoprazole  (PROTONIX ) 40 MG tablet Take 1 tablet (40 mg total) by mouth 2 (two) times daily. 11/29/23 02/27/24  Tammy Sor, PA-C  traMADol  (ULTRAM ) 50 MG tablet Take 0.5-1 tablets (25-50 mg total) by mouth every 12 (twelve) hours as needed for severe pain (pain score 7-10). 11/29/23   Tammy Sor, PA-C    Allergies: Codeine, Nsaids, and Penicillins    Review of Systems  Gastrointestinal:  Positive for abdominal pain.    Updated Vital  Signs BP (!) 135/100 (BP Location: Right Arm)   Pulse 81   Temp 97.9 F (36.6 C) (Oral)   Resp 16   Ht 5' 5 (1.651 m)   Wt 79.4 kg   SpO2 98%   BMI 29.12 kg/m   Physical Exam Vitals and nursing note reviewed.  Constitutional:      Appearance: He is well-developed.  HENT:     Head: Normocephalic and atraumatic.  Eyes:     Pupils: Pupils are equal, round, and reactive to light.  Neck:     Vascular: No JVD.  Cardiovascular:     Rate and Rhythm: Normal rate and regular rhythm.     Heart sounds: No murmur heard.    No friction rub. No gallop.  Pulmonary:     Effort: No respiratory distress.     Breath sounds: No wheezing.  Abdominal:     General: There is no distension.     Tenderness: There is no abdominal tenderness. There is no guarding or rebound.  Musculoskeletal:        General: Normal range of motion.     Cervical back: Normal range of motion and neck supple.  Skin:    Coloration: Skin is not pale.     Findings: No rash.  Neurological:     Mental Status: He is alert and oriented to person, place, and time.  Psychiatric:  Behavior: Behavior normal.     (all labs ordered are listed, but only abnormal results are displayed) Labs Reviewed  CBC WITH DIFFERENTIAL/PLATELET - Abnormal; Notable for the following components:      Result Value   WBC 15.8 (*)    RBC 3.25 (*)    Hemoglobin 10.0 (*)    HCT 31.6 (*)    Platelets 682 (*)    All other components within normal limits  COMPREHENSIVE METABOLIC PANEL WITH GFR  LIPASE, BLOOD  AMMONIA  PROTIME-INR    EKG: None  Radiology: No results found.  {Document cardiac monitor, telemetry assessment procedure when appropriate:32947} Procedures   Medications Ordered in the ED - No data to display    {Click here for ABCD2, HEART and other calculators REFRESH Note before signing:1}                              Medical Decision Making Amount and/or Complexity of Data Reviewed Labs: ordered. Radiology:  ordered.   67 yO M with a chief complaints of drainage from an abdominal wound.  Patient recently had a laparoscopic surgery due to a perforated gastric ulcer.  He felt he had been doing reasonably well.  Eating and drinking okay.  No pain.  But notes he had a significant amount of drainage from the wound.  He had called his general surgeon yesterday because he had some redness to the area and was started on doxycycline.  Will obtain a laboratory evaluation.  CT imaging.  {Document critical care time when appropriate  Document review of labs and clinical decision tools ie CHADS2VASC2, etc  Document your independent review of radiology images and any outside records  Document your discussion with family members, caretakers and with consultants  Document social determinants of health affecting pt's care  Document your decision making why or why not admission, treatments were needed:32947:::1}   Final diagnoses:  None    ED Discharge Orders     None

## 2023-12-05 NOTE — ED Triage Notes (Addendum)
 Patient BIB EMS from home c/o Generalized abdominal pain. Patient report worsening pain and distention tonight. Patient report increase drainage to the post op site. Patient denies N/V.  Patient denies Chest pain and SOB. Hx perforated prepyloric ulcer s/p lap omental Arlyss patch/washout 11/21/2023 and C3-4 anterior cervical tenectomy and fusion 11/18.

## 2023-12-06 ENCOUNTER — Emergency Department (HOSPITAL_COMMUNITY)

## 2023-12-06 DIAGNOSIS — R1310 Dysphagia, unspecified: Secondary | ICD-10-CM | POA: Diagnosis present

## 2023-12-06 DIAGNOSIS — Z87891 Personal history of nicotine dependence: Secondary | ICD-10-CM | POA: Diagnosis not present

## 2023-12-06 DIAGNOSIS — M4722 Other spondylosis with radiculopathy, cervical region: Secondary | ICD-10-CM | POA: Diagnosis present

## 2023-12-06 DIAGNOSIS — M4712 Other spondylosis with myelopathy, cervical region: Secondary | ICD-10-CM | POA: Diagnosis present

## 2023-12-06 DIAGNOSIS — Z885 Allergy status to narcotic agent status: Secondary | ICD-10-CM | POA: Diagnosis not present

## 2023-12-06 DIAGNOSIS — M5442 Lumbago with sciatica, left side: Secondary | ICD-10-CM | POA: Diagnosis present

## 2023-12-06 DIAGNOSIS — M4802 Spinal stenosis, cervical region: Secondary | ICD-10-CM | POA: Diagnosis present

## 2023-12-06 DIAGNOSIS — K257 Chronic gastric ulcer without hemorrhage or perforation: Secondary | ICD-10-CM | POA: Diagnosis present

## 2023-12-06 DIAGNOSIS — R1084 Generalized abdominal pain: Secondary | ICD-10-CM | POA: Diagnosis present

## 2023-12-06 DIAGNOSIS — T8149XA Infection following a procedure, other surgical site, initial encounter: Secondary | ICD-10-CM | POA: Diagnosis present

## 2023-12-06 DIAGNOSIS — M79662 Pain in left lower leg: Secondary | ICD-10-CM | POA: Diagnosis not present

## 2023-12-06 DIAGNOSIS — Z79899 Other long term (current) drug therapy: Secondary | ICD-10-CM | POA: Diagnosis not present

## 2023-12-06 DIAGNOSIS — L02211 Cutaneous abscess of abdominal wall: Secondary | ICD-10-CM | POA: Diagnosis present

## 2023-12-06 DIAGNOSIS — R4182 Altered mental status, unspecified: Secondary | ICD-10-CM | POA: Diagnosis present

## 2023-12-06 DIAGNOSIS — Z981 Arthrodesis status: Secondary | ICD-10-CM | POA: Diagnosis not present

## 2023-12-06 DIAGNOSIS — I10 Essential (primary) hypertension: Secondary | ICD-10-CM | POA: Diagnosis present

## 2023-12-06 DIAGNOSIS — K651 Peritoneal abscess: Secondary | ICD-10-CM | POA: Diagnosis present

## 2023-12-06 DIAGNOSIS — Z8619 Personal history of other infectious and parasitic diseases: Secondary | ICD-10-CM | POA: Diagnosis not present

## 2023-12-06 DIAGNOSIS — Z96642 Presence of left artificial hip joint: Secondary | ICD-10-CM | POA: Diagnosis present

## 2023-12-06 DIAGNOSIS — K219 Gastro-esophageal reflux disease without esophagitis: Secondary | ICD-10-CM | POA: Diagnosis present

## 2023-12-06 DIAGNOSIS — T8143XA Infection following a procedure, organ and space surgical site, initial encounter: Secondary | ICD-10-CM | POA: Diagnosis present

## 2023-12-06 DIAGNOSIS — Z880 Allergy status to penicillin: Secondary | ICD-10-CM | POA: Diagnosis not present

## 2023-12-06 DIAGNOSIS — G8929 Other chronic pain: Secondary | ICD-10-CM | POA: Diagnosis present

## 2023-12-06 DIAGNOSIS — Z886 Allergy status to analgesic agent status: Secondary | ICD-10-CM | POA: Diagnosis not present

## 2023-12-06 LAB — CBC WITH DIFFERENTIAL/PLATELET
Basophils Relative: 0.1 % (ref 0.0–0.1)
Eosinophils Absolute: 0 % (ref 0.0–0.5)
Eosinophils Relative: 0.1 % (ref 0.0–0.5)
HCT: 31.6 % — ABNORMAL LOW (ref 39.0–52.0)
Hemoglobin: 10 g/dL — ABNORMAL LOW (ref 13.0–17.0)
Immature Granulocytes: 14 % (ref 0.0–0.1)
Lymphocytes Relative: 13 %
Lymphs Abs: 2 % (ref 0.7–4.0)
MCH: 30.8 pg (ref 26.0–34.0)
MCHC: 31.6 g/dL (ref 30.0–36.0)
MCV: 97.2 fL (ref 80.0–100.0)
Monocytes Absolute: 1 % (ref 0.1–1.0)
Monocytes Relative: 1.5 % — AB (ref 0.1–1.0)
Monocytes Relative: 10 % (ref 0.7–4.0)
Neutro Abs: 10 K/uL — AB (ref 1.7–7.7)
Neutrophils Relative %: 62 %
Platelets: 682 K/uL — ABNORMAL HIGH (ref 150–400)
RBC: 3.25 MIL/uL — ABNORMAL LOW (ref 4.22–5.81)
RDW: 13.4 % (ref 11.5–15.5)
WBC Morphology: 2.13 — AB (ref 0.00–0.07)
WBC: 15.8 K/uL — ABNORMAL HIGH (ref 4.0–10.5)
nRBC: 0.1 % (ref 0.0–0.2)

## 2023-12-06 LAB — COMPREHENSIVE METABOLIC PANEL WITH GFR
ALT: 144 U/L — ABNORMAL HIGH (ref 0–44)
AST: 66 U/L — ABNORMAL HIGH (ref 15–41)
Albumin: 2.8 g/dL — ABNORMAL LOW (ref 3.5–5.0)
Alkaline Phosphatase: 308 U/L — ABNORMAL HIGH (ref 38–126)
Anion gap: 9 (ref 5–15)
BUN: 9 mg/dL (ref 8–23)
CO2: 30 mmol/L (ref 22–32)
Calcium: 9.2 mg/dL (ref 8.9–10.3)
Chloride: 94 mmol/L — ABNORMAL LOW (ref 98–111)
Creatinine, Ser: 0.96 mg/dL (ref 0.61–1.24)
GFR, Estimated: >60 mL/min (ref >60)
Glucose, Bld: 112 mg/dL — ABNORMAL HIGH (ref 70–99)
Potassium: 3.8 mmol/L (ref 3.5–5.1)
Sodium: 133 mmol/L — ABNORMAL LOW (ref 135–145)
Total Bilirubin: 0.4 mg/dL (ref 0.0–1.2)
Total Protein: 6.3 g/dL — ABNORMAL LOW (ref 6.5–8.1)

## 2023-12-06 LAB — URINALYSIS, ROUTINE W REFLEX MICROSCOPIC
Bilirubin Urine: NEGATIVE
Glucose, UA: NEGATIVE mg/dL
Hgb urine dipstick: NEGATIVE
Ketones, ur: NEGATIVE mg/dL
Leukocytes,Ua: NEGATIVE
Nitrite: NEGATIVE
Protein, ur: NEGATIVE mg/dL
Specific Gravity, Urine: 1.024 (ref 1.005–1.030)
pH: 8 (ref 5.0–8.0)

## 2023-12-06 LAB — BLOOD GAS, VENOUS
Acid-Base Excess: 7.8 mmol/L — ABNORMAL HIGH (ref 0.0–2.0)
Bicarbonate: 33.2 mmol/L — ABNORMAL HIGH (ref 20.0–28.0)
O2 Saturation: 45.8 %
Patient temperature: 37
pCO2, Ven: 50 mmHg (ref 44–60)
pH, Ven: 7.43 (ref 7.25–7.43)
pO2, Ven: <31 mmHg — CL (ref 32–45)

## 2023-12-06 LAB — LIPASE, BLOOD: Lipase: 42 U/L (ref 11–51)

## 2023-12-06 LAB — AMMONIA: Ammonia: 23 umol/L (ref 9–35)

## 2023-12-06 LAB — I-STAT CG4 LACTIC ACID, ED
Lactic Acid, Venous: 0.9 mmol/L (ref 0.5–1.9)
Lactic Acid, Venous: 0.8 mmol/L (ref 0.5–1.9)

## 2023-12-06 MED ORDER — ACETAMINOPHEN 325 MG PO TABS
650.0000 mg | ORAL_TABLET | Freq: Four times a day (QID) | ORAL | Status: DC | PRN
Start: 2023-12-06 — End: 2023-12-09
  Administered 2023-12-06: 650 mg via ORAL
  Filled 2023-12-06: qty 2

## 2023-12-06 MED ORDER — ONDANSETRON HCL 4 MG PO TABS
4.0000 mg | ORAL_TABLET | Freq: Four times a day (QID) | ORAL | Status: DC | PRN
  Administered 2023-12-11: 4 mg via ORAL
  Filled 2023-12-06: qty 1

## 2023-12-06 MED ORDER — ACETAMINOPHEN 650 MG RE SUPP: 650.0000 mg | Freq: Four times a day (QID) | RECTAL | Status: DC | PRN

## 2023-12-06 MED ORDER — SODIUM CHLORIDE 0.9 % IV SOLN
2.0000 g | Freq: Three times a day (TID) | INTRAVENOUS | Status: DC
  Administered 2023-12-06 – 2023-12-11 (×16): 2 g via INTRAVENOUS
  Filled 2023-12-06 (×16): qty 12.5

## 2023-12-06 MED ORDER — ONDANSETRON HCL 4 MG/2ML IJ SOLN: 4.0000 mg | Freq: Four times a day (QID) | INTRAMUSCULAR | Status: DC | PRN

## 2023-12-06 MED ORDER — VANCOMYCIN HCL 1500 MG/300ML IV SOLN
1500.0000 mg | Freq: Once | INTRAVENOUS | Status: AC
  Administered 2023-12-06: 1500 mg via INTRAVENOUS
  Filled 2023-12-06: qty 300

## 2023-12-06 MED ORDER — TRAMADOL HCL 50 MG PO TABS
50.0000 mg | ORAL_TABLET | Freq: Four times a day (QID) | ORAL | Status: DC | PRN
  Administered 2023-12-07 – 2023-12-10 (×3): 50 mg via ORAL
  Filled 2023-12-06 (×3): qty 1

## 2023-12-06 MED ORDER — IOHEXOL 300 MG/ML  SOLN
100.0000 mL | Freq: Once | INTRAMUSCULAR | Status: AC | PRN
Start: 2023-12-06 — End: 2023-12-06
  Administered 2023-12-06: 100 mL via INTRAVENOUS

## 2023-12-06 MED ORDER — SUCRALFATE 1 GM/10ML PO SUSP: 1.0000 g | Freq: Three times a day (TID) | ORAL | Status: DC | PRN

## 2023-12-06 MED ORDER — LISINOPRIL 10 MG PO TABS
10.0000 mg | ORAL_TABLET | Freq: Every day | ORAL | Status: DC
  Administered 2023-12-06 – 2023-12-08 (×3): 10 mg via ORAL
  Filled 2023-12-06 (×3): qty 1

## 2023-12-06 MED ORDER — PANTOPRAZOLE SODIUM 40 MG PO TBEC
40.0000 mg | DELAYED_RELEASE_TABLET | Freq: Two times a day (BID) | ORAL | Status: DC
  Administered 2023-12-06 – 2023-12-11 (×11): 40 mg via ORAL
  Filled 2023-12-06 (×11): qty 1

## 2023-12-06 MED ORDER — METRONIDAZOLE 500 MG/100ML IV SOLN
500.0000 mg | Freq: Once | INTRAVENOUS | Status: AC
  Administered 2023-12-06: 500 mg via INTRAVENOUS
  Filled 2023-12-06: qty 100

## 2023-12-06 MED ORDER — VANCOMYCIN HCL 750 MG/150ML IV SOLN
750.0000 mg | Freq: Two times a day (BID) | INTRAVENOUS | Status: DC
  Administered 2023-12-06 – 2023-12-09 (×6): 750 mg via INTRAVENOUS
  Filled 2023-12-06 (×8): qty 150

## 2023-12-06 MED ORDER — OXYCODONE HCL 5 MG PO TABS
5.0000 mg | ORAL_TABLET | ORAL | Status: DC | PRN
  Administered 2023-12-06 – 2023-12-08 (×4): 5 mg via ORAL
  Filled 2023-12-06 (×5): qty 1

## 2023-12-06 MED ORDER — OXYCODONE HCL 5 MG PO TABS
5.0000 mg | ORAL_TABLET | ORAL | Status: DC | PRN
  Administered 2023-12-06 (×2): 5 mg via ORAL
  Filled 2023-12-06 (×2): qty 1

## 2023-12-06 MED ORDER — SODIUM CHLORIDE 0.9 % IV SOLN
2.0000 g | Freq: Once | INTRAVENOUS | Status: AC
  Administered 2023-12-06: 2 g via INTRAVENOUS
  Filled 2023-12-06: qty 12.5

## 2023-12-06 MED ORDER — HYDROCHLOROTHIAZIDE 25 MG PO TABS
25.0000 mg | ORAL_TABLET | Freq: Every day | ORAL | Status: DC
  Administered 2023-12-06 – 2023-12-11 (×6): 25 mg via ORAL
  Filled 2023-12-06 (×6): qty 1

## 2023-12-06 NOTE — Progress Notes (Signed)
 Progress Note   Patient: Theodore Stevenson FMW:[id number] DOB: June 14, 1955 DOA: 12/05/2023     0 DOS: the patient was seen and examined on 12/06/2023   Brief hospital course: 68yo with h/o HTN, gastric ulcer s/p Arlyss patch on 11/9 with JP drain, and chronic back pain due to spinal stenosis who presented on 11/23 for worsening abdominal wound drainage.  CT with postoperative changes and fluid collection 4.4 cm.  Surgery consulted, cleared for diet, appears to be improving without need for intervention.    Assessment & Plan Post-operative wound abscess Perforated prepyloric ulcer s/p lap omental Arlyss patch/washout 11/21/2023 S/p laparoscopic Arlyss patch for perforated ulcer on 11/9 Concern for worsening drainage prompted presentation Persistent but not significantly worse LFT elevation CT with collection in tract Surgery consulted Appears to be improving spontaneously Regular diet Continue antibiotics for now He did take 3 doses of doxycycline PTA but this is likely not sufficient to be considered outpatient treatment failure Possible dc to home tomorrow if he continues to progress well Hypertension Resume home Lisinopril  and hydrochlorothiazide   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 S/p uncomplicated C3-4 anterior cervical tenectomy and fusion on 11/18 He has been having L calf pain which has slowly improved post-operatively but is now worse PT/OT consulted He is having some post-operative dysphagia and odynophagia; will order Carafate  suspension      Consultants: Surgery  Procedures: None  Antibiotics: Cefepime  11/24- Vancomycin  11/24-  30 Day Unplanned Readmission Risk Score    Flowsheet Row ED to Hosp-Admission (Discharged) from 11/19/2023 in [place] LONG 4TH FLOOR PROGRESSIVE CARE AND UROLOGY  30 Day Unplanned Readmission Risk Score (%) 17.94 Filed at 11/29/2023 1600    This score is the  patient's risk of an unplanned readmission within 30 days of being discharged (0 -100%). The score is based on dignosis, age, lab data, medications, orders, and past utilization.   Low:  0-14.9   Medium: 15-21.9   High: 22-29.9   Extreme: 30 and above           Subjective: Feeling better.  Less confused, more conversant.  Able to get up and go to the bathroom and more stable than he has been.  Overall feeling better.   Objective: Vitals:   12/06/23 1025 12/06/23 1404  BP: (!) 148/74 (!) 147/58  Pulse: 70 79  Resp:  16  Temp: 97.8 F (36.6 C) 98 F (36.7 C)  SpO2: 100% 100%   No intake or output data in the 24 hours ending 12/06/23 1629 Filed Weights   12/05/23 2320 12/06/23 0629  Weight: 79.4 kg 81.9 kg    Exam:  General:  Appears calm and comfortable and is in NAD Eyes:  normal lids, iris ENT:  grossly normal hearing, lips & tongue, mmm; soft C-collar in place Cardiovascular:  RRR. No LE edema.  Respiratory:   CTA bilaterally with no wheezes/rales/rhonchi.  Normal respiratory effort. Abdomen:  soft, NT, ND Skin:  no rash or induration seen on limited exam Musculoskeletal:  L calf atrophy noted on exam Psychiatric:  grossly normal but mildly somnolent mood and affect, speech fluent and appropriate, AOx3 Neurologic:  CN 2-12 grossly intact, moves all extremities in coordinated fashion  Data Reviewed: I have reviewed the patient's lab results since admission.  Pertinent labs for today include:   Na++ 133, not clinically significant AP 308, 386 on 11/13 Albumin  2.8 AST 66/ALT 144; 131/105 on 11/13 WBC 15.8 Hgb 10 Platelets 682  Family Communication: Daughters were present  Mobility: PT/OT Consulted      Code Status: Full Code   Disposition: Status is: Inpatient Remains inpatient appropriate because: ongoing monitoring     Time spent: 50 minutes  Unresulted Labs (From admission, onward)     Start     Ordered   12/07/23 0500  CBC with  Differential/Platelet  Tomorrow morning,   R        12/06/23 1627   12/07/23 0500  Comprehensive metabolic panel with GFR  Tomorrow morning,   R        12/06/23 1627             Author: Delon Herald, MD 12/06/2023 4:29 PM  For on call review [email or web address].

## 2023-12-06 NOTE — Assessment & Plan Note (Addendum)
 S/p laparoscopic Arlyss patch for perforated ulcer on 11/9 Concern for worsening drainage prompted presentation Persistent but not significantly worse LFT elevation CT with collection in tract Surgery consulted Appears to be improving spontaneously Regular diet Continue antibiotics for now He did take 3 doses of doxycycline PTA but this is likely not sufficient to be considered outpatient treatment failure Possible dc to home tomorrow if he continues to progress well

## 2023-12-06 NOTE — Plan of Care (Signed)

## 2023-12-06 NOTE — Assessment & Plan Note (Addendum)
 S/p uncomplicated C3-4 anterior cervical tenectomy and fusion on 11/18 He has been having L calf pain which has slowly improved post-operatively but is now worse PT/OT consulted He is having some post-operative dysphagia and odynophagia; will order Carafate  suspension

## 2023-12-06 NOTE — Hospital Course (Signed)
 68yo with h/o HTN, gastric ulcer s/p Arlyss patch on 11/9 with JP drain, and chronic back pain due to spinal stenosis who presented on 11/23 for worsening abdominal wound drainage.  CT with postoperative changes and fluid collection 4.4 cm.  Surgery consulted, cleared for diet, appears to be improving without need for intervention.

## 2023-12-06 NOTE — H&P (Signed)
 History and Physical    Theodore Stevenson FMW:[id number] DOB: 02-24-1955 DOA: 12/05/2023  PCP: Patient, No Pcp Per   Chief Complaint:  wound check  HPI: Theodore Stevenson is a 68 y.o. male with medical history significant of hypertension, spinal stenosis, chronic back pain who presents emergency department with abdominal pain concern for gastric ulcer.  Patient had a Arlyss patch of ulcer placed on 11/9.  He was hospitalized and was having issues with Intermittent confusion.  General surgery placed a JP drain that time and patient completed course of antibiotics.  He was scheduled for outpatient follow-up.  At home he developed worsening drainage and abdominal wound so he presented to the ER.  On arrival he was afebrile and hemodynamically stable.  Labs were obtained which showed WBC 15.8, hemoglobin 10, platelets 682, sodium 133, AST 66, ALT 144, alkaline phosphatase 308, lipase 42, INR 1.1, pH 7.43, lactic acid 0.9, urinalysis negative for infection.  Patient underwent CT abdomen pelvis which showed postsurgical changes and a fluid collection measuring 4.4 cm.  Chest x-ray showed no acute findings.  CT head showed no acute findings.  On examination he was having active drainage from pustular wound in his abdomen.  He was admitted for further evaluation was started on broad-spectrum antibiotics.   Review of Systems: Review of Systems  Constitutional:  Positive for fever. Negative for chills, diaphoresis, malaise/fatigue and weight loss.  HENT: Negative.    Eyes: Negative.   Respiratory: Negative.    Cardiovascular: Negative.   Gastrointestinal: Negative.   Genitourinary: Negative.   Musculoskeletal: Negative.   Skin: Negative.   Neurological: Negative.   Endo/Heme/Allergies: Negative.   Psychiatric/Behavioral: Negative.       As per HPI otherwise 10 point review of systems negative.   Allergies  Allergen Reactions   Codeine Nausea And Vomiting   Nsaids Other (See Comments)    H/o  ulcers & poss perf = NO NSAIDs   Penicillins Other (See Comments)    Pt was young unsure exactly  Unknown    Past Medical History:  Diagnosis Date   Arthritis    Hepatitis    1973   Hypertension    Spinal stenosis     Past Surgical History:  Procedure Laterality Date   ANTERIOR CERVICAL DECOMP/DISCECTOMY FUSION N/A 11/30/2023   Procedure: ANTERIOR CERVICAL DECOMPRESSION/DISCECTOMY FUSION CERVICAL THREE-FOUR;  Surgeon: Louis Shove, MD;  Location: [facility] OR;  Service: Neurosurgery;  Laterality: N/A;   BACK SURGERY     1987   CARPAL TUNNEL RELEASE     right and left   HIP SURGERY  06/30/2011   total left hip   JOINT REPLACEMENT     right total hip   LAPAROSCOPY  11/21/2023   Procedure: LAPAROSCOPY, DIAGNOSTIC;  Surgeon: Sheldon Standing, MD;  Location: [facility];  Service: General;;  drainage of abscess; omental graham patch   TOTAL HIP ARTHROPLASTY  06/30/2011   Procedure: TOTAL HIP ARTHROPLASTY;  Surgeon: Maude LELON Right, MD;  Location: [facility] OR;  Service: Orthopedics;  Laterality: Left;     reports that he quit smoking about 17 years ago. His smoking use included cigarettes. He started smoking about 27 years ago. He has a 10 pack-year smoking history. His smokeless tobacco use includes chew. He reports that he does not drink alcohol and does not use drugs.  No family history on file.  Prior to Admission medications   Medication Sig Start Date End Date Taking? Authorizing Provider  acetaminophen  (TYLENOL ) 325 MG tablet Take  2 tablets (650 mg total) by mouth every 6 (six) hours as needed for mild pain (pain score 1-3) or moderate pain (pain score 4-6). 11/29/23   Tammy Sor, PA-C  cyclobenzaprine  (FLEXERIL ) 10 MG tablet Take 1 tablet (10 mg total) by mouth 3 (three) times daily as needed for muscle spasms. 11/30/23   Louis Shove, MD  hydrochlorothiazide  (HYDRODIURIL ) 25 MG tablet Take 1 tablet (25 mg total) by mouth daily. 11/30/23   Lue Elsie BROCKS, MD  HYDROcodone -acetaminophen   (NORCO/VICODIN) 5-325 MG tablet Take 1 tablet by mouth every 4 (four) hours as needed for moderate pain (pain score 4-6) ((score 4 to 6)). 11/30/23   Louis Shove, MD  lisinopril  (ZESTRIL ) 10 MG tablet Take 1 tablet (10 mg total) by mouth daily. 11/30/23   Lue Elsie BROCKS, MD  pantoprazole  (PROTONIX ) 40 MG tablet Take 1 tablet (40 mg total) by mouth 2 (two) times daily. 11/29/23 02/27/24  Tammy Sor, PA-C  traMADol  (ULTRAM ) 50 MG tablet Take 0.5-1 tablets (25-50 mg total) by mouth every 12 (twelve) hours as needed for severe pain (pain score 7-10). 11/29/23   Tammy Sor, PA-C    Physical Exam: Vitals:   12/05/23 2330 12/06/23 0000 12/06/23 0152 12/06/23 0322  BP: (!) 159/65 137/65 (!) 148/73 136/60  Pulse: 79  82 70  Resp: 16 16 (!) 24 17  Temp:    97.7 F (36.5 C)  TempSrc:    Oral  SpO2: 98%  97% 98%  Weight:      Height:       Physical Exam Constitutional:      Appearance: He is normal weight.  HENT:     Head: Normocephalic.     Mouth/Throat:     Mouth: Mucous membranes are moist.  Eyes:     Extraocular Movements: Extraocular movements intact.  Cardiovascular:     Rate and Rhythm: Normal rate and regular rhythm.  Pulmonary:     Effort: Pulmonary effort is normal.     Breath sounds: Normal breath sounds.  Abdominal:     General: Abdomen is flat. Bowel sounds are absent.     Palpations: Abdomen is soft.  Genitourinary:    Rectum: Normal.  Skin:    General: Skin is warm.     Capillary Refill: Capillary refill takes less than 2 seconds.     Findings: Erythema present.  Neurological:     General: No focal deficit present.     Mental Status: He is alert.  Psychiatric:        Mood and Affect: Mood normal.        Labs on Admission: I have personally reviewed the patients's labs and imaging studies.  Assessment/Plan Principal Problem:   Post-operative wound abscess   # Postoperative wound abscess - Patient had perforated gastric ulcer 2 weeks ago and  underwent surgical repair - Patient's noted purulent drainage - CT scan shows collection  Plan: Continue Vanco, cefepime  Gen surgery consulted by emergency department  # Hypertension-continue hydrochlorothiazide , lisinopril   # Spinal stenosis-patient had cervical discectomy on 11/18 without complication  # GERD-continue Protonix    Admission status: Inpatient Med-Surg  Certification: The appropriate patient status for this patient is INPATIENT. Inpatient status is judged to be reasonable and necessary in order to provide the required intensity of service to ensure the patient's safety. The patient's presenting symptoms, physical exam findings, and initial radiographic and laboratory data in the context of their chronic comorbidities is felt to place them at high risk for further clinical deterioration.  Furthermore, it is not anticipated that the patient will be medically stable for discharge from the hospital within 2 midnights of admission.   * I certify that at the point of admission it is my clinical judgment that the patient will require inpatient hospital care spanning beyond 2 midnights from the point of admission due to high intensity of service, high risk for further deterioration and high frequency of surveillance required.DEWAINE Lamar Dess MD [facility] If 7PM-7AM, please contact night-coverage [email or web address]  12/06/2023, 4:27 AM

## 2023-12-06 NOTE — Progress Notes (Signed)
 Pharmacy Antibiotic Note  Theodore Stevenson is a 68 y.o. male admitted on 12/05/2023 with post-op abdominal wound abscess. Patient with recent perforated gastric ulcer and underwent surgical repair.  Pharmacy has been consulted for Vancomycin  and Cefepime  dosing.  Plan: Cefepime  2g IV q8h Vancomycin  1500mg  IV x 1 followed by Vancomycin  750 mg IV Q 12 hrs. Goal AUC 400-550.  Expected AUC: 467.8  SCr used: 0.96 Follow renal function  Height: 5' 5 (165.1 cm) Weight: 79.4 kg (175 lb) IBW/kg (Calculated) : 61.5  Temp (24hrs), Avg:97.8 F (36.6 C), Min:97.7 F (36.5 C), Max:97.9 F (36.6 C)  Recent Labs  Lab 12/05/23 2332 12/06/23 0143 12/06/23 0328  WBC 15.8*  --   --   CREATININE 0.96  --   --   LATICACIDVEN  --  0.9 0.8    Estimated Creatinine Clearance: 69.6 mL/min (by C-G formula based on SCr of 0.96 mg/dL).    Allergies  Allergen Reactions   Codeine Nausea And Vomiting   Nsaids Other (See Comments)    H/o ulcers & poss perf = NO NSAIDs   Penicillins Other (See Comments)    Pt was young unsure exactly  Unknown    Antimicrobials this admission: 11/24 Metronidazole  x 1 11/24 Cefepime  >>   11/24 Vancomycin  >>  Dose adjustments this admission:    Microbiology results:    Thank you for allowing pharmacy to be a part of this patient's care.  Arvin Gauss, PharmD 12/06/2023 4:39 AM

## 2023-12-06 NOTE — Assessment & Plan Note (Signed)
 Resume home Lisinopril  and hydrochlorothiazide 

## 2023-12-06 NOTE — Progress Notes (Signed)
 Subjective/Chief Complaint: Recent Graham patch with 10 day hospital stay, drain out prior to dc then had cervical fusion. Has been talking gibberish at home, drain site red yesterday and they called on call person who gave them doxy, had large amount of yellowish/bloody output and is better now   Objective: Vital signs in last 24 hours: Temp:  [97.7 F (36.5 C)-97.9 F (36.6 C)] 97.7 F (36.5 C) (11/24 0322) Pulse Rate:  [70-82] 70 (11/24 0322) Resp:  [16-24] 17 (11/24 0322) BP: (135-159)/(60-100) 136/60 (11/24 0322) SpO2:  [97 %-98 %] 98 % (11/24 0322) Weight:  [79.4 kg] 79.4 kg (11/23 2320)    Intake/Output from previous day: No intake/output data recorded. Intake/Output this shift: No intake/output data recorded.  Ab soft nontender, nondistended, incisions clean, drain site with some exudate otherwise it does not look infected, I don't see any drainage right now  Lab Results:  Recent Labs    12/05/23 2332  WBC 15.8*  HGB 10.0*  HCT 31.6*  PLT 682*   BMET Recent Labs    12/05/23 2332  NA 133*  K 3.8  CL 94*  CO2 30  GLUCOSE 112*  BUN 9  CREATININE 0.96  CALCIUM 9.2   PT/INR Recent Labs    12/05/23 2332  LABPROT 14.8  INR 1.1   ABG Recent Labs    12/06/23 0138  HCO3 33.2*    Studies/Results: CT Head Wo Contrast Result Date: 12/06/2023 EXAM: CT HEAD WITHOUT CONTRAST 12/06/2023 02:13:30 AM TECHNIQUE: CT of the head was performed without the administration of intravenous contrast. Automated exposure control, iterative reconstruction, and/or weight based adjustment of the mA/kV was utilized to reduce the radiation dose to as low as reasonably achievable. COMPARISON: 11/26/2023 CLINICAL HISTORY: Mental status change, unknown cause. FINDINGS: BRAIN AND VENTRICLES: No acute hemorrhage. No evidence of acute infarct. No hydrocephalus. No extra-axial collection. No mass effect or midline shift. Stable mild periventricular chronic small vessel ischemic  change. ORBITS: No acute abnormality. SINUSES: Mild mucosal thickening of right sphenoid sinus. SOFT TISSUES AND SKULL: No acute soft tissue abnormality. No skull fracture. Atherosclerosis of skullbase vasculature. IMPRESSION: 1. No acute intracranial abnormality. 2. Stable mild periventricular chronic small vessel ischemic change. Electronically signed by: Franky Stanford MD 12/06/2023 02:29 AM EST RP Workstation: [id number]   DG Chest Port 1 View Result Date: 12/06/2023 EXAM: 1 VIEW(S) XRAY OF THE CHEST 12/06/2023 01:46:48 AM COMPARISON: 09/12/2010 CLINICAL HISTORY: ams ams FINDINGS: LUNGS AND PLEURA: No focal pulmonary opacity. No pleural effusion. No pneumothorax. HEART AND MEDIASTINUM: No acute abnormality of the cardiac and mediastinal silhouettes. BONES AND SOFT TISSUES: No acute osseous abnormality. IMPRESSION: 1. No acute cardiopulmonary process. Electronically signed by: Franky Crease MD 12/06/2023 01:50 AM EST RP Workstation: [id number]   CT ABDOMEN PELVIS W CONTRAST Result Date: 12/06/2023 EXAM: CT ABDOMEN AND PELVIS WITH CONTRAST 12/06/2023 12:51:08 AM TECHNIQUE: CT of the abdomen and pelvis was performed with the administration of 100 mL of iohexol  (OMNIPAQUE ) 300 MG/ML solution. Multiplanar reformatted images are provided for review. Automated exposure control, iterative reconstruction, and/or weight-based adjustment of the mA/kV was utilized to reduce the radiation dose to as low as reasonably achievable. COMPARISON: 11/26/2023 CLINICAL HISTORY: Abdominal pain, acute, nonlocalized. Prior laparoscopy on 11/21/2023. FINDINGS: LOWER CHEST: Minimal right basilar atelectasis is noted. No sizable effusion is seen. LIVER: The liver is fatty infiltrated. GALLBLADDER AND BILE DUCTS: The gallbladder is well distended, similar to that seen on the prior exam. No biliary ductal dilatation. SPLEEN: Spleen is within normal  limits. PANCREAS: Pancreas is within normal limits. ADRENAL GLANDS: Adrenal glands are  unremarkable. KIDNEYS, URETERS AND BLADDER: Kidneys demonstrate a normal enhancement pattern. No stones in the kidneys or ureters. The bladder is well distended. GI AND BOWEL: Stomach demonstrates no acute abnormality. The small bowel is within normal limits. Scattered diverticular changes of the colon are noted without evidence of diverticulitis. The appendix is within normal limits. Changes consistent with the known perforated gas ulcer are seen with postsurgical changes. No recurrent perforation is identified. There is no bowel obstruction. PERITONEUM AND RETROPERITONEUM: Minimal intraabdominal air is noted but improved when compared with the prior exam. A small fluid collection measuring 4.4 x 2.1 cm is noted in the right upper quadrant, best seen on image 40 of series 2 in the previous tract of the drainage catheter. This may represent a small postoperative seroma. No findings to suggest active extravasation are seen. Some fluid is noted within the omentum, similar to that seen on prior exams in part due to the prior surgery as well as some previous fluid within the abdomen. No ascites. VASCULATURE: Aorta is normal in caliber. Aortic calcifications are seen. LYMPH NODES: No lymphadenopathy. REPRODUCTIVE ORGANS: The prostate is not well visualized on this exam due to scattered artifact from bilateral hips prostheses. BONES AND SOFT TISSUES: The prior surgical drain in the right upper quadrant has been removed. Bony structures appear within normal limits given the patient's age. IMPRESSION: 1. Postsurgical changes related to known perforated ulcer with no recurrent perforation identified. 2. Small right upper quadrant fluid collection (4.4 x 2.1 cm) in the prior drainage catheter tract, possibly a postoperative seroma. 3. Scattered diverticular changes of the colon without evidence of diverticulitis. Electronically signed by: Oneil Devonshire MD 12/06/2023 01:00 AM EST RP Workstation: [id number]     Anti-infectives: Anti-infectives (From admission, onward)    Start     Dose/Rate Route Frequency Ordered Stop   12/06/23 1700  vancomycin  (VANCOREADY) IVPB 750 mg/150 mL        750 mg 150 mL/hr over 60 Minutes Intravenous Every 12 hours 12/06/23 0443     12/06/23 1000  ceFEPIme  (MAXIPIME ) 2 g in sodium chloride  0.9 % 100 mL IVPB        2 g 200 mL/hr over 30 Minutes Intravenous Every 8 hours 12/06/23 0438     12/06/23 0445  vancomycin  (VANCOREADY) IVPB 1500 mg/300 mL        1,500 mg 150 mL/hr over 120 Minutes Intravenous  Once 12/06/23 0438     12/06/23 0130  ceFEPIme  (MAXIPIME ) 2 g in sodium chloride  0.9 % 100 mL IVPB        2 g 200 mL/hr over 30 Minutes Intravenous  Once 12/06/23 0124 12/06/23 0213   12/06/23 0130  metroNIDAZOLE  (FLAGYL ) IVPB 500 mg        500 mg 100 mL/hr over 60 Minutes Intravenous  Once 12/06/23 0124 12/06/23 0302       Assessment/Plan: Lap graham patch for perforated ulcer 11/9- SG -CT with collection in tract -I think much of this has drained now and he is improved compared to picture from yesterday the family has -can have regular diet -agree with abx, follow wbc -I think should resolve with that and nothing that needs to be drained by IR -pharm dvt prophylaxis is fine   Donnice Bury 12/06/2023

## 2023-12-07 DIAGNOSIS — T8149XA Infection following a procedure, other surgical site, initial encounter: Secondary | ICD-10-CM | POA: Diagnosis not present

## 2023-12-07 LAB — CBC WITH DIFFERENTIAL/PLATELET
Abs Immature Granulocytes: 2.89 K/uL — ABNORMAL HIGH (ref 0.00–0.07)
Basophils Absolute: 0.1 K/uL (ref 0.0–0.1)
Basophils Relative: 1 %
Eosinophils Absolute: 0.1 K/uL (ref 0.0–0.5)
Eosinophils Relative: 1 %
HCT: 29.2 % — ABNORMAL LOW (ref 39.0–52.0)
Hemoglobin: 9.1 g/dL — ABNORMAL LOW (ref 13.0–17.0)
Immature Granulocytes: 18 %
Lymphocytes Relative: 12 %
Lymphs Abs: 1.8 K/uL (ref 0.7–4.0)
MCH: 31 pg (ref 26.0–34.0)
MCHC: 31.2 g/dL (ref 30.0–36.0)
MCV: 99.3 fL (ref 80.0–100.0)
Monocytes Absolute: 1.5 K/uL — ABNORMAL HIGH (ref 0.1–1.0)
Monocytes Relative: 10 %
Neutro Abs: 9.4 K/uL — ABNORMAL HIGH (ref 1.7–7.7)
Neutrophils Relative %: 58 %
Platelets: 604 K/uL — ABNORMAL HIGH (ref 150–400)
RBC: 2.94 MIL/uL — ABNORMAL LOW (ref 4.22–5.81)
RDW: 13.9 % (ref 11.5–15.5)
Smear Review: NORMAL
WBC: 15.8 K/uL — ABNORMAL HIGH (ref 4.0–10.5)
nRBC: 0.1 % (ref 0.0–0.2)

## 2023-12-07 LAB — COMPREHENSIVE METABOLIC PANEL WITH GFR
ALT: 88 U/L — ABNORMAL HIGH (ref 0–44)
AST: 37 U/L (ref 15–41)
Albumin: 2.6 g/dL — ABNORMAL LOW (ref 3.5–5.0)
Alkaline Phosphatase: 254 U/L — ABNORMAL HIGH (ref 38–126)
Anion gap: 9 (ref 5–15)
BUN: 12 mg/dL (ref 8–23)
CO2: 27 mmol/L (ref 22–32)
Calcium: 8.5 mg/dL — ABNORMAL LOW (ref 8.9–10.3)
Chloride: 100 mmol/L (ref 98–111)
Creatinine, Ser: 0.92 mg/dL (ref 0.61–1.24)
GFR, Estimated: >60 mL/min (ref >60)
Glucose, Bld: 105 mg/dL — ABNORMAL HIGH (ref 70–99)
Potassium: 3.9 mmol/L (ref 3.5–5.1)
Sodium: 135 mmol/L (ref 135–145)
Total Bilirubin: 0.2 mg/dL (ref 0.0–1.2)
Total Protein: 5.7 g/dL — ABNORMAL LOW (ref 6.5–8.1)

## 2023-12-07 NOTE — Assessment & Plan Note (Addendum)
 S/p laparoscopic Arlyss patch for perforated ulcer on 11/9 Concern for worsening drainage prompted presentation Persistent but not significantly worse LFT elevation CT with collection in tract Surgery consulted Appears to be improving spontaneously Regular diet Continue antibiotics for now He did take 3 doses of doxycycline PTA but this is likely not sufficient to be considered outpatient treatment failure Flat WBC count today so will continue 1 more day of IV antibiotics Likely dc to home tomorrow AM if he continues to progress well

## 2023-12-07 NOTE — Assessment & Plan Note (Deleted)
 S/p uncomplicated C3-4 anterior cervical tenectomy and fusion on 11/18 He has been having L calf pain which has slowly improved post-operatively but is now worse PT/OT consulted He is having some post-operative dysphagia and odynophagia; will order Carafate  suspension

## 2023-12-07 NOTE — Assessment & Plan Note (Signed)
 S/p uncomplicated C3-4 anterior cervical tenectomy and fusion on 11/18 He has been having L calf pain which has slowly improved post-operatively but was worse yesterday; will order DVT US  to r/o clot although this is not highly suspicious PT/OT consulted, recommending HH therapy He is having some post-operative dysphagia and odynophagia; will order Carafate  suspension

## 2023-12-07 NOTE — Progress Notes (Signed)
 Assessment & Plan: Lap graham patch for perforated ulcer 11/9 - SG - CT with collection in tract - clinically drained spontaneously - regular diet - IV abx's per medical service - WBC unchanged at 15.8K this AM  [name].  Denies abdominal pain.  Tolerating diet.  Consider further 24 hours of IV abx's versus convert to oral abx's with discharge - will leave up to medical service.  Will also arrange follow up at [facility] office.        Krystal Spinner, MD [facility] practice Office: [phone]        Chief Complaint: Perforated ulcer, infected drainage tract  Subjective: Patient without complaints, eating breakfast, family in room  Objective: Vital signs in last 24 hours: Temp:  [97.8 F (36.6 C)-98 F (36.7 C)] 98 F (36.7 C) (11/25 0534) Pulse Rate:  [70-79] 76 (11/25 0534) Resp:  [15-16] 16 (11/25 0534) BP: (147-155)/(58-74) 151/69 (11/25 0534) SpO2:  [97 %-100 %] 97 % (11/25 0534) Last BM Date : 12/06/23 (per patient)  Intake/Output from previous day: 11/24 0701 - 11/25 0700 In: 544 [IV Piggyback:544] Out: -  Intake/Output this shift: No intake/output data recorded.  Physical Exam: HEENT - sclerae clear, mucous membranes moist Abdomen - soft, protuberant; dressing dry and intact; surgical wounds dry and intact  Lab Results:  Recent Labs    12/05/23 2332 12/07/23 0423  WBC 15.8* 15.8*  HGB 10.0* 9.1*  HCT 31.6* 29.2*  PLT 682* 604*   BMET Recent Labs    12/05/23 2332 12/07/23 0423  NA 133* 135  K 3.8 3.9  CL 94* 100  CO2 30 27  GLUCOSE 112* 105*  BUN 9 12  CREATININE 0.96 0.92  CALCIUM 9.2 8.5*   PT/INR Recent Labs    12/05/23 2332  LABPROT 14.8  INR 1.1   Comprehensive Metabolic Panel:    Component Value Date/Time   NA 135 12/07/2023 0423   NA 133 (L) 12/05/2023 2332   K 3.9 12/07/2023 0423   K 3.8 12/05/2023 2332   CL 100 12/07/2023 0423   CL 94 (L) 12/05/2023 2332   CO2 27 12/07/2023  0423   CO2 30 12/05/2023 2332   BUN 12 12/07/2023 0423   BUN 9 12/05/2023 2332   CREATININE 0.92 12/07/2023 0423   CREATININE 0.96 12/05/2023 2332   GLUCOSE 105 (H) 12/07/2023 0423   GLUCOSE 112 (H) 12/05/2023 2332   CALCIUM 8.5 (L) 12/07/2023 0423   CALCIUM 9.2 12/05/2023 2332   AST 37 12/07/2023 0423   AST 66 (H) 12/05/2023 2332   ALT 88 (H) 12/07/2023 0423   ALT 144 (H) 12/05/2023 2332   ALKPHOS 254 (H) 12/07/2023 0423   ALKPHOS 308 (H) 12/05/2023 2332   BILITOT 0.2 12/07/2023 0423   BILITOT 0.4 12/05/2023 2332   PROT 5.7 (L) 12/07/2023 0423   PROT 6.3 (L) 12/05/2023 2332   ALBUMIN  2.6 (L) 12/07/2023 0423   ALBUMIN  2.8 (L) 12/05/2023 2332    Studies/Results: CT Head Wo Contrast Result Date: 12/06/2023 EXAM: CT HEAD WITHOUT CONTRAST 12/06/2023 02:13:30 AM TECHNIQUE: CT of the head was performed without the administration of intravenous contrast. Automated exposure control, iterative reconstruction, and/or weight based adjustment of the mA/kV was utilized to reduce the radiation dose to as low as reasonably achievable. COMPARISON: 11/26/2023 CLINICAL HISTORY: Mental status change, unknown cause. FINDINGS: BRAIN AND VENTRICLES: No acute hemorrhage. No evidence of acute infarct. No hydrocephalus. No extra-axial collection. No mass effect or midline shift.  Stable mild periventricular chronic small vessel ischemic change. ORBITS: No acute abnormality. SINUSES: Mild mucosal thickening of right sphenoid sinus. SOFT TISSUES AND SKULL: No acute soft tissue abnormality. No skull fracture. Atherosclerosis of skullbase vasculature. IMPRESSION: 1. No acute intracranial abnormality. 2. Stable mild periventricular chronic small vessel ischemic change. Electronically signed by: Franky Stanford MD 12/06/2023 02:29 AM EST RP Workstation: [id number]   DG Chest Port 1 View Result Date: 12/06/2023 EXAM: 1 VIEW(S) XRAY OF THE CHEST 12/06/2023 01:46:48 AM COMPARISON: 09/12/2010 CLINICAL HISTORY: ams ams  FINDINGS: LUNGS AND PLEURA: No focal pulmonary opacity. No pleural effusion. No pneumothorax. HEART AND MEDIASTINUM: No acute abnormality of the cardiac and mediastinal silhouettes. BONES AND SOFT TISSUES: No acute osseous abnormality. IMPRESSION: 1. No acute cardiopulmonary process. Electronically signed by: Franky Crease MD 12/06/2023 01:50 AM EST RP Workstation: [id number]   CT ABDOMEN PELVIS W CONTRAST Result Date: 12/06/2023 EXAM: CT ABDOMEN AND PELVIS WITH CONTRAST 12/06/2023 12:51:08 AM TECHNIQUE: CT of the abdomen and pelvis was performed with the administration of 100 mL of iohexol  (OMNIPAQUE ) 300 MG/ML solution. Multiplanar reformatted images are provided for review. Automated exposure control, iterative reconstruction, and/or weight-based adjustment of the mA/kV was utilized to reduce the radiation dose to as low as reasonably achievable. COMPARISON: 11/26/2023 CLINICAL HISTORY: Abdominal pain, acute, nonlocalized. Prior laparoscopy on 11/21/2023. FINDINGS: LOWER CHEST: Minimal right basilar atelectasis is noted. No sizable effusion is seen. LIVER: The liver is fatty infiltrated. GALLBLADDER AND BILE DUCTS: The gallbladder is well distended, similar to that seen on the prior exam. No biliary ductal dilatation. SPLEEN: Spleen is within normal limits. PANCREAS: Pancreas is within normal limits. ADRENAL GLANDS: Adrenal glands are unremarkable. KIDNEYS, URETERS AND BLADDER: Kidneys demonstrate a normal enhancement pattern. No stones in the kidneys or ureters. The bladder is well distended. GI AND BOWEL: Stomach demonstrates no acute abnormality. The small bowel is within normal limits. Scattered diverticular changes of the colon are noted without evidence of diverticulitis. The appendix is within normal limits. Changes consistent with the known perforated gas ulcer are seen with postsurgical changes. No recurrent perforation is identified. There is no bowel obstruction. PERITONEUM AND RETROPERITONEUM:  Minimal intraabdominal air is noted but improved when compared with the prior exam. A small fluid collection measuring 4.4 x 2.1 cm is noted in the right upper quadrant, best seen on image 40 of series 2 in the previous tract of the drainage catheter. This may represent a small postoperative seroma. No findings to suggest active extravasation are seen. Some fluid is noted within the omentum, similar to that seen on prior exams in part due to the prior surgery as well as some previous fluid within the abdomen. No ascites. VASCULATURE: Aorta is normal in caliber. Aortic calcifications are seen. LYMPH NODES: No lymphadenopathy. REPRODUCTIVE ORGANS: The prostate is not well visualized on this exam due to scattered artifact from bilateral hips prostheses. BONES AND SOFT TISSUES: The prior surgical drain in the right upper quadrant has been removed. Bony structures appear within normal limits given the patient's age. IMPRESSION: 1. Postsurgical changes related to known perforated ulcer with no recurrent perforation identified. 2. Small right upper quadrant fluid collection (4.4 x 2.1 cm) in the prior drainage catheter tract, possibly a postoperative seroma. 3. Scattered diverticular changes of the colon without evidence of diverticulitis. Electronically signed by: Oneil Devonshire MD 12/06/2023 01:00 AM EST RP Workstation: [id number] Boas Athalene Kolle 12/07/2023  Patient ID: Theodore Stevenson, male   DOB: [birth year], 68 y.o.  MRN: [id number]

## 2023-12-07 NOTE — TOC Initial Note (Signed)
 Transition of Care [facility]) - Initial/Assessment Note    Patient Details  Name: Theodore Stevenson MRN: [id number] Date of Birth: [birth year]  Transition of Care [facility]) CM/SW Contact:    Doneta Glenys DASEN, RN Phone Number: 12/07/2023, 1:58 PM  Clinical Narrative:                 PTA lives in a house with Raybuck,Sandra (Spouse)[phone];DME-[facility],[place];HH-[facility] (PT);denies oxygen and SDOH needs; Spouse will transport home at discharge. Will need a resumption of HH order at order.  Inpatient care manager will progression to discharge.  Expected Discharge Plan: Home w Home Health Services Barriers to Discharge: Continued Medical Work up   Patient Goals and CMS Choice Patient states their goals for this hospitalization and ongoing recovery are:: home with [facility] PT CMS Medicare.gov Compare Post Acute Care list provided to:: Patient Choice offered to / list presented to : Patient [place] ownership interest in [facility].provided to:: Patient    Expected Discharge Plan and Services In-house Referral: NA Discharge Planning Services: CM Consult   Living arrangements for the past 2 months: Single Family Home                 DME Arranged: N/A DME Agency: NA       HH Arranged: NA HH Agency: NA        Prior Living Arrangements/Services Living arrangements for the past 2 months: Single Family Home Lives with:: Spouse Patient language and need for interpreter reviewed:: Yes Do you feel safe going back to the place where you live?: Yes      Need for Family Participation in Patient Care: Yes (Comment) Care giver support system in place?: Yes (comment) Current home services: DME, Home PT (walker,[facility], [facility]-PT) Criminal Activity/Legal Involvement Pertinent to Current Situation/Hospitalization: No - Comment as needed  Activities of Daily Living   ADL Screening (condition at time of admission) Independently performs ADLs?: Yes (appropriate for developmental  age) Is the patient deaf or have difficulty hearing?: No Does the patient have difficulty seeing, even when wearing glasses/contacts?: No Does the patient have difficulty concentrating, remembering, or making decisions?: No  Permission Sought/Granted Permission sought to share information with : Case Manager, Magazine Features Editor Permission granted to share information with : Yes, Verbal Permission Granted  Share Information with NAME: [place], Lebeau (Spouse)  [phone]  Permission granted to share info w AGENCY: [facility]        Emotional Assessment Appearance:: Appears stated age Attitude/Demeanor/Rapport: Gracious Affect (typically observed): Appropriate Orientation: : Oriented to Self, Oriented to Place, Oriented to  Time Alcohol / Substance Use: Not Applicable Psych Involvement: No (comment)  Admission diagnosis:  Post-operative wound abscess [T81.49XA] Fever in adult [R50.9] Altered mental status, unspecified altered mental status type [R41.82] Patient Active Problem List   Diagnosis Date Noted   Post-operative wound abscess 12/06/2023   Cervical spondylosis with myelopathy and radiculopathy 11/30/2023   Perforated abdominal viscus 11/22/2023   Acidosis, lactic 11/22/2023   Perforated prepyloric ulcer s/p lap omental Arlyss patch/washout 11/21/2023 11/21/2023   UTI (urinary tract infection) 11/20/2023   PUD (peptic ulcer disease) 11/20/2023   AKI (acute kidney injury) 11/19/2023   Hyperkalemia 11/19/2023   Hyperglycemia 11/19/2023   History of left hip replacement 02/13/2021   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   Colitis 07/24/2019   Chronic left-sided low back pain with left-sided sciatica 02/14/2018   Avascular necrosis of hip (HCC) 07/02/2011   Hypertension 07/02/2011   Hepatitis 07/02/2011   Postoperative anemia  due to acute blood loss 07/02/2011   PCP:  Patient, No Pcp Per Pharmacy:   [id number] [place], [place] -  [address] AT [facility] [address] [place] [address] Phone: [phone] Fax: [phone]  [place] [facility] [address] [place] [address] Phone: [phone] Fax: [phone]     Social Drivers of Health (SDOH) Social History: SDOH Screenings   Food Insecurity: No Food Insecurity (12/06/2023)  Housing: Low Risk  (12/06/2023)  Transportation Needs: No Transportation Needs (12/06/2023)  Utilities: Not At Risk (12/06/2023)  Social Connections: Moderately Integrated (12/06/2023)  Tobacco Use: High Risk (11/30/2023)   SDOH Interventions:     Readmission Risk Interventions    12/07/2023   12:57 PM  Readmission Risk Prevention Plan  Transportation Screening Complete  PCP or Specialist Appt within 5-7 Days Complete  Home Care Screening Complete  Medication Review (RN CM) Complete

## 2023-12-07 NOTE — Evaluation (Signed)
 Physical Therapy Evaluation Patient Details Name: Theodore Stevenson MRN: [id number] DOB: [birth year] Today's Date: 12/07/2023  History of Present Illness  Patient is a 68 year old male admitted to [facility] for wound management d/t worsening drainage and surgical abdominal wound on 11/23/225 PMH: Recent hospitalization for perforated prepyloric ulcer with abscesses and AKI, underwent lap procedure with drainage and Arlyss patch & washout on 11/21/23; then recent uncomplicated C3-4 ACDF on 11/30/23,,  HTN, hepatitis, Hx of smoking, OA, spinal stenosis, left low back pain with sciatica, bilateral THA  Clinical Impression  Pt admitted with above diagnosis. At baseline pt is independent.  He has had some recent weakness and balance deficits due to cervical cord compression and ACDF and abdominal laparoscopic sx.  Pt has DME and home support.  Today, pt motivated to work with therapy and family present. Does need cues for cervical precautions - has soft collar in place.  Pt needing CGA for STS and to ambulate 120' with RW.  Did note some deficits in L LE strength compared to R.  Frequent cues for RW safety/management.  Pt expected to progress well and would recommend resuming HHPT.  Pt currently with functional limitations due to the deficits listed below (see PT Problem List). Pt will benefit from acute skilled PT to increase their independence and safety with mobility to allow discharge.           If plan is discharge home, recommend the following: A little help with walking and/or transfers;A little help with bathing/dressing/bathroom   Can travel by private vehicle   Yes    Equipment Recommendations None recommended by PT  Recommendations for Other Services       Functional Status Assessment Patient has had a recent decline in their functional status and demonstrates the ability to make significant improvements in function in a reasonable and predictable amount of time.     Precautions / Restrictions  Precautions Precautions: Fall;Cervical;Other (comment) (abdominal) Precaution Booklet Issued: Yes (comment) Recall of Precautions/Restrictions: Impaired Precaution/Restrictions Comments: spinal, abdominal - recalled 2/3 cervical precautions (family assisting with third), needs cues frequently to not turn head Required Braces or Orthoses: Cervical Brace Cervical Brace: Soft collar;Other (comment) (when OOB)      Mobility  Bed Mobility               General bed mobility comments: in chair    Transfers Overall transfer level: Needs assistance Equipment used: Rolling walker (2 wheels) Transfers: Sit to/from Stand Sit to Stand: Supervision           General transfer comment: cues for hand placement,    Ambulation/Gait Ambulation/Gait assistance: Contact guard assist Gait Distance (Feet): 120 Feet Assistive device: Rolling walker (2 wheels) Gait Pattern/deviations: Step-through pattern, Trendelenburg, Trunk flexed Gait velocity: functional     General Gait Details: Noting very mild trendelenburg on L side; multiple cues to stay close to RW; otherwise steady gait  Stairs            Wheelchair Mobility     Tilt Bed    Modified Rankin (Stroke Patients Only)       Balance Overall balance assessment: Needs assistance Sitting-balance support: Feet supported Sitting balance-Leahy Scale: Good     Standing balance support: Reliant on assistive device for balance, During functional activity, Bilateral upper extremity supported Standing balance-Leahy Scale: Poor Standing balance comment: reliant on RW-did not test without  Pertinent Vitals/Pain Pain Assessment Pain Assessment: 0-10 Pain Score: 7  Pain Location: neck and L leg Pain Descriptors / Indicators: Discomfort Pain Intervention(s): Limited activity within patient's tolerance, Monitored during session, Premedicated before session, Repositioned    Home Living  Family/patient expects to be discharged to:: Private residence Living Arrangements: Spouse/significant other Available Help at Discharge: Family;Available 24 hours/day Type of Home: House Home Access: Stairs to enter Entrance Stairs-Rails: None Entrance Stairs-Number of Steps: 2 Alternate Level Stairs-Number of Steps: Flight Home Layout: Multi-level;Able to live on main level with bedroom/bathroom;Full bath on main level Home Equipment: Rolling Walker (2 wheels);Cane - single point;BSC/3in1;Tub bench Additional Comments: remains on 1st level of home since surgeries    Prior Function Prior Level of Function : Independent/Modified Independent             Mobility Comments: Prior to recent sx pt was indep incl driving.  He could ambulate in community but does report was having some issues with balance due to cervical cord compression (reports improvement post op).  Since sx pt was home and wife.  He was using RW and staying on first floor.  HHPT had been ordered but had not started ADLs Comments: was indep prior to surgeries, was being assisted by wife and children after surgeries , had not started [facility]     Extremity/Trunk Assessment   Upper Extremity Assessment Upper Extremity Assessment: Defer to OT evaluation RUE Deficits / Details: Shoulder flexion AROM 90 deg; gross strength 4-/5 LUE Deficits / Details: Shoulder flexion AROM 85 deg; gross strength 4-/5 LUE Sensation: decreased light touch LUE Coordination: decreased fine motor    Lower Extremity Assessment Lower Extremity Assessment: LLE deficits/detail;RLE deficits/detail RLE Deficits / Details: Noting mild edema R lower leg - family reports present past few days.  Denies any R LE pain.  Sensation intact.  ROM WFL.  MMT 5/5 RLE Sensation: WNL LLE Deficits / Details: No edema in L leg also noting mild atrophy compared to R (family reports baseline was experiencing weakness on L side prior to cervical sx);  Some pain in L ankle  and calf that reports is chronic.  Family reports MD mentioned U/S to r/o DVT although felt was unlikely as no edema and L calf pain chronic. Noted no orders at this time for U/S. Negative Homan's. ROM WFL; MMT: grossly 4+/5 throughout LLE Sensation: WNL    Cervical / Trunk Assessment Cervical / Trunk Assessment: Neck Surgery  Communication   Communication Communication: Impaired Factors Affecting Communication: Reduced clarity of speech    Cognition Arousal: Alert Behavior During Therapy: WFL for tasks assessed/performed   PT - Cognitive impairments: No apparent impairments                       PT - Cognition Comments: Does need cues for neck precautions (recalls not to twist but will turn to talk to family without thinking about precautiosn - turned chair facing family) Following commands: Intact       Cueing       General Comments General comments (skin integrity, edema, etc.): Noting OT reports bil ankle edema and elevated feet earlier this morning.  During PT, L LE improved with no edema, mild edema on R LE.  Left with legs elevated.    Exercises     Assessment/Plan    PT Assessment Patient needs continued PT services  PT Problem List Decreased strength;Decreased mobility;Decreased safety awareness;Decreased knowledge of precautions;Decreased activity tolerance;Decreased balance;Decreased knowledge of use of  DME;Pain       PT Treatment Interventions DME instruction;Therapeutic exercise;Gait training;Stair training;Functional mobility training;Therapeutic activities;Patient/family education;Neuromuscular re-education;Balance training;Modalities    PT Goals (Current goals can be found in the Care Plan section)  Acute Rehab PT Goals Patient Stated Goal: return home PT Goal Formulation: With patient/family Time For Goal Achievement: 12/21/23 Potential to Achieve Goals: Good    Frequency Min 2X/week     Co-evaluation               AM-PAC PT 6  Clicks Mobility  Outcome Measure Help needed turning from your back to your side while in a flat bed without using bedrails?: A Little Help needed moving from lying on your back to sitting on the side of a flat bed without using bedrails?: A Little Help needed moving to and from a bed to a chair (including a wheelchair)?: A Little Help needed standing up from a chair using your arms (e.g., wheelchair or bedside chair)?: A Little Help needed to walk in hospital room?: A Little Help needed climbing 3-5 steps with a railing? : A Little 6 Click Score: 18    End of Session Equipment Utilized During Treatment: Gait belt Activity Tolerance: Patient tolerated treatment well Patient left: with call bell/phone within reach;with family/visitor present;in chair Nurse Communication: Mobility status PT Visit Diagnosis: Other abnormalities of gait and mobility (R26.89);Muscle weakness (generalized) (M62.81);Pain;Difficulty in walking, not elsewhere classified (R26.2) Pain - Right/Left: Left Pain - part of body: Leg    Time: 8942-8885 PT Time Calculation (min) (ACUTE ONLY): 17 min   Charges:   PT Evaluation $PT Eval Low Complexity: 1 Low   PT General Charges $$ ACUTE PT VISIT: 1 Visit         Benjiman, PT Acute Rehab [facility] Rehab [phone]   Benjiman VEAR [place] 12/07/2023, 11:50 AM

## 2023-12-07 NOTE — Assessment & Plan Note (Deleted)
 Resume home Lisinopril  and hydrochlorothiazide 

## 2023-12-07 NOTE — Assessment & Plan Note (Deleted)
 S/p laparoscopic Arlyss patch for perforated ulcer on 11/9 Concern for worsening drainage prompted presentation Persistent but not significantly worse LFT elevation CT with collection in tract Surgery consulted Appears to be improving spontaneously Regular diet Continue antibiotics for now He did take 3 doses of doxycycline PTA but this is likely not sufficient to be considered outpatient treatment failure Possible dc to home tomorrow if he continues to progress well

## 2023-12-07 NOTE — Assessment & Plan Note (Signed)
 Resume home Lisinopril  and hydrochlorothiazide 

## 2023-12-07 NOTE — Evaluation (Signed)
 Occupational Therapy Evaluation Patient Details Name: Theodore Stevenson MRN: [id number] DOB: 13-Aug-1955 Today's Date: 12/07/2023   History of Present Illness   Patient is a 68 year old male admitted to [facility] for wound management d/t worsening drainage and abdominal wound PMH: recent uncomplicated C3-4 anterior cervical tenectomy and fusion, perforated prepyloric ulcer with abscesses and AKI, underwent lap procedure with drainage and Arlyss patch & washout on 11/21/23,  HTN, hepatitis, Hx of smoking, OA, spinal stenosis, left low back pain with sciatica, bilateral THA     Clinical Impressions PTA, patient lives at home with wife and was completely indep including driving prior to cervical fusion and abdominal surgeries. Was home from hospital, using RW and having assist for steps, amb and A/IADL's from wife and family  and about to begin [facility] therapy services when re-hospitalized. Currently, patient presents with deficits outlined below (see OT Problem List for details) most significantly pain, decreased UE ROM, generalized muscle strength, sensation and coordination, balance, activity tolerance and mild higher level cognition for cervical and abdominal precaution recall and safety limiting BADL's (mod A LB) and functional mobility (min A) performance. Recommending HHOT services with family assist and support when discharged from acute hospital setting.  Family reports having all DME and OT issued and trained in reacher and sock aide this session. Patient requires continued Acute care hospital level OT services to progress safety and functional performance and allow for discharge.       If plan is discharge home, recommend the following:   A lot of help with walking and/or transfers;A lot of help with bathing/dressing/bathroom;Assistance with cooking/housework;Direct supervision/assist for medications management;Direct supervision/assist for financial management;Assist for transportation;Help with stairs  or ramp for entrance;Supervision due to cognitive status     Functional Status Assessment   Patient has had a recent decline in their functional status and demonstrates the ability to make significant improvements in function in a reasonable and predictable amount of time.     Equipment Recommendations   None recommended by OT      Precautions/Restrictions   Precautions Precautions: Fall;Cervical;Other (comment) (abdominal) Recall of Precautions/Restrictions: Impaired Precaution/Restrictions Comments: spinal, abdominal Required Braces or Orthoses: Cervical Brace Cervical Brace: Soft collar;Other (comment) (at all times when up oob) Restrictions Weight Bearing Restrictions Per Provider Order: No     Mobility Bed Mobility Overal bed mobility: Needs Assistance Bed Mobility: Sidelying to Sit Rolling: Contact guard assist, Used rails Sidelying to sit: Contact guard assist, HOB elevated, Used rails       General bed mobility comments: min cues for spinal and abdominal precautions    Transfers Overall transfer level: Needs assistance Equipment used: Rolling walker (2 wheels) Transfers: Sit to/from Stand, Bed to chair/wheelchair/BSC Sit to Stand: Contact guard assist     Step pivot transfers: Min assist     General transfer comment: cues for hand placement, obstacle navigation      Balance Overall balance assessment: Needs assistance Sitting-balance support: Feet supported Sitting balance-Leahy Scale: Good     Standing balance support: Reliant on assistive device for balance, During functional activity, Bilateral upper extremity supported Standing balance-Leahy Scale: Poor Standing balance comment: reliant on RW                           ADL either performed or assessed with clinical judgement   ADL Overall ADL's : Needs assistance/impaired Eating/Feeding: Set up;Sitting   Grooming: Wash/dry hands;Wash/dry face;Oral care;Sitting;Set up    Upper Body Bathing:  Contact guard assist;Sitting   Lower Body Bathing: Moderate assistance;Sit to/from stand;Cueing for compensatory techniques;Cueing for back precautions;With adaptive equipment;Cueing for safety;Cueing for sequencing Lower Body Bathing Details (indicate cue type and reason): cues for figure 4 and AD use with cervical and abdominal prec Upper Body Dressing : Minimal assistance;Sitting Upper Body Dressing Details (indicate cue type and reason): assisted with cervical collar Lower Body Dressing: Moderate assistance;Adhering to back precautions;Cueing for back precautions;Cueing for compensatory techniques;Cueing for sequencing;Cueing for safety;With adaptive equipment Lower Body Dressing Details (indicate cue type and reason): issued and trained in reacher and sock aide, wife to assist for now due to cervical and abdominal precautions Toilet Transfer: Minimal assistance;Ambulation;Regular Toilet;Rolling walker (2 wheels) Toilet Transfer Details (indicate cue type and reason): min cues for safety Toileting- Clothing Manipulation and Hygiene: Contact guard assist;Sitting/lateral lean       Functional mobility during ADLs: Minimal assistance;Rolling walker (2 wheels) General ADL Comments: cues for precautions, sequencing and safety     Vision Baseline Vision/History: 0 No visual deficits;1 Wears glasses Ability to See in Adequate Light: 0 Adequate Patient Visual Report: No change from baseline Vision Assessment?: No apparent visual deficits            Pertinent Vitals/Pain Pain Assessment Pain Assessment: Faces Faces Pain Scale: Hurts a little bit Breathing: normal Negative Vocalization: none Facial Expression: smiling or inexpressive Body Language: relaxed Consolability: no need to console PAINAD Score: 0 Pain Location: abdomen and neck Pain Descriptors / Indicators: Discomfort Pain Intervention(s): Monitored during session, Premedicated before session,  Repositioned, RN gave pain meds during session     Extremity/Trunk Assessment Upper Extremity Assessment Upper Extremity Assessment: Generalized weakness;Right hand dominant;RUE deficits/detail;LUE deficits/detail RUE Deficits / Details: Shoulder flexion AROM 90 deg; gross strength 4-/5 LUE Deficits / Details: Shoulder flexion AROM 85 deg; gross strength 4-/5 LUE Sensation: decreased light touch LUE Coordination: decreased fine motor   Lower Extremity Assessment Lower Extremity Assessment: Defer to PT evaluation   Cervical / Trunk Assessment Cervical / Trunk Assessment: Kyphotic;Neck Surgery   Communication Communication Communication: Impaired Factors Affecting Communication: Reduced clarity of speech   Cognition Arousal: Alert Behavior During Therapy: WFL for tasks assessed/performed Cognition: Cognition impaired     Awareness: Intellectual awareness impaired, Online awareness impaired Memory impairment (select all impairments): Short-term memory   Executive functioning impairment (select all impairments): Problem solving, Sequencing OT - Cognition Comments: confusion from infection has resolved but now with higher level decreased recall and insight, mildly impulsive                 Following commands: Intact Following commands impaired: Follows multi-step commands inconsistently     Cueing  General Comments   Cueing Techniques: Verbal cues;Tactile cues  cervical incisions with steristrips anterior neck, abdomen covered with dressing on site, +1-2 B ankle edema with LE's elevated post session, VSR 151/85 BP; HR 85 bpm, SpO2 98% on RA           Home Living Family/patient expects to be discharged to:: Private residence Living Arrangements: Spouse/significant other Available Help at Discharge: Family;Available 24 hours/day Type of Home: House Home Access: Stairs to enter [employer] of Steps: 2 Entrance Stairs-Rails: None Home Layout:  Multi-level;Able to live on main level with bedroom/bathroom;Full bath on main level Alternate Level Stairs-Number of Steps: Flight   Bathroom Shower/Tub: Tub/shower unit;Curtain   Bathroom Toilet: Standard Bathroom Accessibility: Yes How Accessible: Accessible via walker Home Equipment: [employer] (2 wheels);Cane - single point;BSC/3in1;Tub bench   Additional Comments: remains  on 1st level of home since surgeries      Prior Functioning/Environment Prior Level of Function : Independent/Modified Independent             Mobility Comments: was indep incl driving prior to last 2 weeks of surgery, was home and wife assisted with RW use and stairs, HHPT had been ordered but had not started ADLs Comments: was indep prior to surgeries, was being assisted by wife after surgeries and children, had not started HHOT    OT Problem List: Decreased strength;Decreased range of motion;Decreased activity tolerance;Impaired balance (sitting and/or standing);Decreased cognition;Decreased safety awareness;Decreased knowledge of use of DME or AE;Decreased knowledge of precautions;Cardiopulmonary status limiting activity;Obesity;Impaired UE functional use;Pain;Increased edema   OT Treatment/Interventions: Self-care/ADL training;Therapeutic exercise;Neuromuscular education;Energy conservation;DME and/or AE instruction;Therapeutic activities;Cognitive remediation/compensation;Patient/family education;Balance training      OT Goals(Current goals can be found in the care plan section)   Acute Rehab OT Goals Patient Stated Goal: to feel stronger OT Goal Formulation: With patient/family Time For Goal Achievement: 12/21/23 ADL Goals Pt Will Perform Lower Body Bathing: with adaptive equipment;with contact guard assist;sit to/from stand Pt Will Perform Lower Body Dressing: with contact guard assist;sit to/from stand;with adaptive equipment Pt Will Transfer to Toilet: with contact guard  assist;ambulating;regular height toilet Pt Will Perform Toileting - Clothing Manipulation and hygiene: with contact guard assist;sit to/from stand Pt Will Perform Tub/Shower Transfer: Tub transfer;tub bench;with min assist;rolling walker Pt/caregiver will Perform Home Exercise Program: Increased ROM;Both right and left upper extremity;With written HEP provided   OT Frequency:  Min 2X/week       AM-PAC OT 6 Clicks Daily Activity     Outcome Measure Help from another person eating meals?: A Little Help from another person taking care of personal grooming?: A Little Help from another person toileting, which includes using toliet, bedpan, or urinal?: A Little Help from another person bathing (including washing, rinsing, drying)?: A Little Help from another person to put on and taking off regular upper body clothing?: A Little Help from another person to put on and taking off regular lower body clothing?: A Lot 6 Click Score: 17   End of Session Equipment Utilized During Treatment: Gait belt;Rolling walker (2 wheels);Cervical collar Nurse Communication: Mobility status;Precautions  Activity Tolerance: Patient tolerated treatment well Patient left: in chair;with call bell/phone within reach;Other (comment) (family deferred chair alarm)  OT Visit Diagnosis: Unsteadiness on feet (R26.81);Muscle weakness (generalized) (M62.81);Pain;Cognitive communication deficit (R41.841) Pain - part of body:  (neck and abdomen)                Time: 0905-1000 OT Time Calculation (min): 55 min Charges:  OT General Charges $OT Visit: 1 Visit OT Evaluation $OT Eval Low Complexity: 1 Low OT Treatments $Self Care/Home Management : 8-22 mins $Therapeutic Activity: 8-22 mins  Ayleah Hofmeister OT/L Acute Rehabilitation Department  [phone]  12/07/2023, 10:43 AM

## 2023-12-07 NOTE — Progress Notes (Signed)
 Progress Note   Patient: Theodore Stevenson FMW:[id number] DOB: March 09, 1955 DOA: 12/05/2023     1 DOS: the patient was seen and examined on 12/07/2023   Brief hospital course: 68yo with h/o HTN, gastric ulcer s/p Arlyss patch on 11/9 with JP drain, and chronic back pain due to spinal stenosis who presented on 11/23 for worsening abdominal wound drainage.  CT with postoperative changes and fluid collection 4.4 cm.  Surgery consulted, cleared for diet, appears to be improving without need for intervention.    Assessment & Plan Post-operative wound abscess Perforated prepyloric ulcer s/p lap omental Arlyss patch/washout 11/21/2023 S/p laparoscopic Arlyss patch for perforated ulcer on 11/9 Concern for worsening drainage prompted presentation Persistent but not significantly worse LFT elevation CT with collection in tract Surgery consulted Appears to be improving spontaneously Regular diet Continue antibiotics for now He did take 3 doses of doxycycline PTA but this is likely not sufficient to be considered outpatient treatment failure Flat WBC count today so will continue 1 more day of IV antibiotics Likely dc to home tomorrow AM if he continues to progress well Hypertension Resume home Lisinopril  and hydrochlorothiazide   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 S/p uncomplicated C3-4 anterior cervical tenectomy and fusion on 11/18 He has been having L calf pain which has slowly improved post-operatively but was worse yesterday; will order DVT US  to r/o clot although this is not highly suspicious PT/OT consulted, recommending HH therapy He is having some post-operative dysphagia and odynophagia; will order Carafate  suspension      Consultants: Surgery OT PT TOC team   Procedures: None   Antibiotics: Cefepime  11/24- Vancomycin  11/24-  30 Day Unplanned Readmission Risk Score    Flowsheet Row ED to  Hosp-Admission (Current) from 12/05/2023 in [facility]  30 Day Unplanned Readmission Risk Score (%) 16.19 Filed at 12/07/2023 1600    This score is the patient's risk of an unplanned readmission within 30 days of being discharged (0 -100%). The score is based on dignosis, age, lab data, medications, orders, and past utilization.   Low:  0-14.9   Medium: 15-21.9   High: 22-29.9   Extreme: 30 and above           Subjective: Feeling better, cognitively clearing, getting back to his baseline per family.   Objective: Vitals:   12/07/23 0959 12/07/23 1328  BP: (!) 155/81 (!) 152/60  Pulse: 85 75  Resp:  18  Temp:  98.5 F (36.9 C)  SpO2:  99%    Intake/Output Summary (Last 24 hours) at 12/07/2023 1718 Last data filed at 12/07/2023 9388 Gross per 24 hour  Intake 543.97 ml  Output --  Net 543.97 ml   Filed Weights   12/05/23 2320 12/06/23 0629  Weight: 79.4 kg 81.9 kg    Exam:  General:  Appears calm and comfortable and is in NAD, up in recliner Eyes:  normal lids, iris ENT:  grossly normal hearing, lips & tongue, mmm Cardiovascular:  RRR.1-2+ LE edema.  Respiratory:   CTA bilaterally with no wheezes/rales/rhonchi.  Normal respiratory effort. Abdomen:  soft, NT, ND Skin:  no rash or induration seen on limited exam Musculoskeletal:  grossly normal tone BUE/BLE, good ROM, no bony abnormality; mild LLE atrophy Psychiatric:  grossly normal mood and affect, speech fluent and appropriate, AOx3 Neurologic:  CN 2-12 grossly intact, moves all extremities in coordinated fashion  Data Reviewed: I have reviewed the patient's lab  results since admission.  Pertinent labs for today include:   Glucose 105 AP 254, improved Albumin  2.6 AST 37/ALT 88, improved WBC 15.8, stable Hgb 9.1 Platelets 604, improved     Family Communication: Wife and daughter were present  Mobility: PT/OT Consulted and are recommending - Home Health  Pt11/25/2025 1149    Code Status: Full Code   Disposition: Status is: Inpatient Remains inpatient appropriate because: ongoing monitoring     Time spent: 50 minutes  Unresulted Labs (From admission, onward)     Start     Ordered   12/08/23 0500  CBC with Differential/Platelet  Tomorrow morning,   R        12/07/23 1718             Author: Delon Herald, MD 12/07/2023 5:18 PM  For on call review [email or web address].

## 2023-12-08 ENCOUNTER — Inpatient Hospital Stay (HOSPITAL_COMMUNITY)

## 2023-12-08 ENCOUNTER — Encounter (HOSPITAL_COMMUNITY): Payer: Self-pay | Admitting: Internal Medicine

## 2023-12-08 DIAGNOSIS — M79662 Pain in left lower leg: Secondary | ICD-10-CM

## 2023-12-08 DIAGNOSIS — T8149XA Infection following a procedure, other surgical site, initial encounter: Secondary | ICD-10-CM | POA: Diagnosis not present

## 2023-12-08 LAB — CBC WITH DIFFERENTIAL/PLATELET
Abs Immature Granulocytes: 3.44 K/uL — ABNORMAL HIGH (ref 0.00–0.07)
Basophils Absolute: 0.1 K/uL (ref 0.0–0.1)
Basophils Relative: 1 %
Eosinophils Absolute: 0.2 K/uL (ref 0.0–0.5)
Eosinophils Relative: 1 %
HCT: 32 % — ABNORMAL LOW (ref 39.0–52.0)
Hemoglobin: 9.9 g/dL — ABNORMAL LOW (ref 13.0–17.0)
Immature Granulocytes: 21 %
Lymphocytes Relative: 12 %
Lymphs Abs: 2 K/uL (ref 0.7–4.0)
MCH: 30.8 pg (ref 26.0–34.0)
MCHC: 30.9 g/dL (ref 30.0–36.0)
MCV: 99.7 fL (ref 80.0–100.0)
Monocytes Absolute: 1.5 K/uL — ABNORMAL HIGH (ref 0.1–1.0)
Monocytes Relative: 9 %
Neutro Abs: 9 K/uL — ABNORMAL HIGH (ref 1.7–7.7)
Neutrophils Relative %: 56 %
Platelets: 556 K/uL — ABNORMAL HIGH (ref 150–400)
RBC: 3.21 MIL/uL — ABNORMAL LOW (ref 4.22–5.81)
RDW: 14 % (ref 11.5–15.5)
Smear Review: NORMAL
WBC: 16.1 K/uL — ABNORMAL HIGH (ref 4.0–10.5)
nRBC: 0 % (ref 0.0–0.2)

## 2023-12-08 MED ORDER — IOHEXOL 300 MG/ML  SOLN
100.0000 mL | Freq: Once | INTRAMUSCULAR | Status: AC | PRN
  Administered 2023-12-08: 100 mL via INTRAVENOUS

## 2023-12-08 MED ORDER — LISINOPRIL 20 MG PO TABS
20.0000 mg | ORAL_TABLET | Freq: Every day | ORAL | Status: DC
  Administered 2023-12-09 – 2023-12-11 (×3): 20 mg via ORAL
  Filled 2023-12-08 (×3): qty 1

## 2023-12-08 MED ORDER — IOHEXOL 9 MG/ML PO SOLN
500.0000 mL | ORAL | Status: AC
  Administered 2023-12-08 (×2): 500 mL via ORAL

## 2023-12-08 NOTE — Plan of Care (Signed)

## 2023-12-08 NOTE — Progress Notes (Signed)
    Assessment & Plan: Lap graham patch for perforated ulcer 11/9 - SG - CT with collection in tract - clinically drained spontaneously - regular diet - IV abx's per medical service - WBC unchanged at 16.1K this AM   [name].  Denies abdominal pain.  Tolerating diet.  Minimal output from drain site.  Abdomen is softer on exam and non-tender.   Patient can likely be discharged home on oral abx's today - not sure repeat CT scan is necessary given clinical improvement aside from WBC.  Will leave up to Dr. Barbarann and medical service.  Follow up arranged at [facility] office.        Krystal Spinner, MD [facility] practice Office: [phone]        Chief Complaint: Perforated ulcer, drain tract abscess  Subjective: Patient up in chair, family at bedside.  No complaints.  Objective: Vital signs in last 24 hours: Temp:  [98.2 F (36.8 C)-98.5 F (36.9 C)] 98.2 F (36.8 C) (11/26 0641) Pulse Rate:  [70-85] 70 (11/26 0643) Resp:  [18] 18 (11/26 0641) BP: (142-155)/(60-81) 142/61 (11/26 0643) SpO2:  [95 %-99 %] 99 % (11/26 0643) Last BM Date : 12/07/23  Intake/Output from previous day: No intake/output data recorded. Intake/Output this shift: No intake/output data recorded.  Physical Exam: HEENT - sclerae clear, mucous membranes moist Abdomen - softer, non-tender; dressing dry and intact  Lab Results:  Recent Labs    12/07/23 0423 12/08/23 0446  WBC 15.8* 16.1*  HGB 9.1* 9.9*  HCT 29.2* 32.0*  PLT 604* 556*   BMET Recent Labs    12/05/23 2332 12/07/23 0423  NA 133* 135  K 3.8 3.9  CL 94* 100  CO2 30 27  GLUCOSE 112* 105*  BUN 9 12  CREATININE 0.96 0.92  CALCIUM 9.2 8.5*   PT/INR Recent Labs    12/05/23 2332  LABPROT 14.8  INR 1.1   Comprehensive Metabolic Panel:    Component Value Date/Time   NA 135 12/07/2023 0423   NA 133 (L) 12/05/2023 2332   K 3.9 12/07/2023 0423   K 3.8 12/05/2023 2332   CL 100 12/07/2023  0423   CL 94 (L) 12/05/2023 2332   CO2 27 12/07/2023 0423   CO2 30 12/05/2023 2332   BUN 12 12/07/2023 0423   BUN 9 12/05/2023 2332   CREATININE 0.92 12/07/2023 0423   CREATININE 0.96 12/05/2023 2332   GLUCOSE 105 (H) 12/07/2023 0423   GLUCOSE 112 (H) 12/05/2023 2332   CALCIUM 8.5 (L) 12/07/2023 0423   CALCIUM 9.2 12/05/2023 2332   AST 37 12/07/2023 0423   AST 66 (H) 12/05/2023 2332   ALT 88 (H) 12/07/2023 0423   ALT 144 (H) 12/05/2023 2332   ALKPHOS 254 (H) 12/07/2023 0423   ALKPHOS 308 (H) 12/05/2023 2332   BILITOT 0.2 12/07/2023 0423   BILITOT 0.4 12/05/2023 2332   PROT 5.7 (L) 12/07/2023 0423   PROT 6.3 (L) 12/05/2023 2332   ALBUMIN  2.6 (L) 12/07/2023 0423   ALBUMIN  2.8 (L) 12/05/2023 2332    Studies/Results: No results found.    Krystal Spinner 12/08/2023  Patient ID: Theodore Stevenson, male   DOB: April 17, 1955, 68 y.o.   MRN: [id number]

## 2023-12-08 NOTE — Progress Notes (Signed)
 PROGRESS NOTE    Theodore Stevenson  FMW:[id number] DOB: [birth year] DOA: 12/05/2023 PCP: Patient, No Pcp Per     Brief Narrative:  Theodore Stevenson is a 68yo with h/o HTN, gastric ulcer s/p Arlyss patch on 11/9 with JP drain, and chronic back pain due to spinal stenosis who presented on 11/23 for worsening abdominal wound drainage.  CT with postoperative changes and fluid collection 4.4 cm.  Surgery consulted, cleared for diet, appears to be improving without need for intervention.  New events last 24 hours / Subjective: Patient seen with family at bedside. Admits that he actually did have bout of nausea and abdominal pain last night and this morning, but did not tell the surgery team today. WBC remains stable but elevated at 16.1. No fevers. We discussed following WBC to see trend, vs proceeding with CT to see improvement or progression. Due to patient's repeat hospitalization, family requested to move forward with CT while he is here to ensure stability/improvement prior to discharge home. Diet remains poor, but slightly improved.    Assessment & Plan:  Principal Problem:   Post-operative wound abscess Active Problems:   Hypertension   Chronic left-sided low back pain with left-sided sciatica   Perforated prepyloric ulcer s/p lap omental Arlyss patch/washout 11/21/2023   Cervical spondylosis with myelopathy and radiculopathy  Post-operative wound abscess Perforated prepyloric ulcer, s/p lap omental Graham patch and washout 11/9 - General surgery following - Fluid collection improved without intervention - Continue cefepime , vanco  - Repeat CT due to lack of improvement in WBC  - Repeat labs in AM   HTN - Lisinopril , hydrochlorothiazide  - Dose of lisinopril  increased due to BP   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 - S/p uncomplicated C3-4 anterior cervical tenectomy and fusion on 11/18 - PT OT  recommending HH therapy    DVT prophylaxis:  SCDs Start: 12/06/23 0427  Code Status: Full Family Communication: At bedside Disposition Plan: Home with home health Status is: Inpatient Remains inpatient appropriate because: CT today     Antimicrobials:  Anti-infectives (From admission, onward)    Start     Dose/Rate Route Frequency Ordered Stop   12/06/23 1700  vancomycin  (VANCOREADY) IVPB 750 mg/150 mL        750 mg 150 mL/hr over 60 Minutes Intravenous Every 12 hours 12/06/23 0443     12/06/23 1000  ceFEPIme  (MAXIPIME ) 2 g in sodium chloride  0.9 % 100 mL IVPB        2 g 200 mL/hr over 30 Minutes Intravenous Every 8 hours 12/06/23 0438     12/06/23 0445  vancomycin  (VANCOREADY) IVPB 1500 mg/300 mL        1,500 mg 150 mL/hr over 120 Minutes Intravenous  Once 12/06/23 0438 12/06/23 0800   12/06/23 0130  ceFEPIme  (MAXIPIME ) 2 g in sodium chloride  0.9 % 100 mL IVPB        2 g 200 mL/hr over 30 Minutes Intravenous  Once 12/06/23 0124 12/06/23 0213   12/06/23 0130  metroNIDAZOLE  (FLAGYL ) IVPB 500 mg        500 mg 100 mL/hr over 60 Minutes Intravenous  Once 12/06/23 0124 12/06/23 0302        Objective: Vitals:   12/07/23 2011 12/08/23 0641 12/08/23 0643 12/08/23 1421  BP: (!) 147/61 (!) 154/69 (!) 142/61 (!) 155/68  Pulse: 71 77 70 67  Resp: 18 18  18   Temp: 98.2 F (36.8 C) 98.2 F (36.8  C)  97.9 F (36.6 C)  TempSrc:      SpO2: 95% 99% 99% 98%  Weight:      Height:       No intake or output data in the 24 hours ending 12/08/23 1445 Filed Weights   12/05/23 2320 12/06/23 0629  Weight: 79.4 kg 81.9 kg    Examination:  General exam: Appears calm and comfortable  Respiratory system: Clear to auscultation. Respiratory effort normal. No respiratory distress. No conversational dyspnea.  Cardiovascular system: S1 & S2 heard, RRR. No murmurs. No pedal edema. Gastrointestinal system: Abdomen is nondistended, soft  Central nervous system: Alert and oriented. No focal  neurological deficits. Speech clear.  Extremities: Symmetric in appearance  Skin: No rashes, lesions or ulcers on exposed skin  Psychiatry: Judgement and insight appear normal. Mood & affect appropriate.   Data Reviewed: I have personally reviewed following labs and imaging studies  CBC: Recent Labs  Lab 12/05/23 2332 12/07/23 0423 12/08/23 0446  WBC 15.8* 15.8* 16.1*  NEUTROABS 10.0* 9.4* 9.0*  HGB 10.0* 9.1* 9.9*  HCT 31.6* 29.2* 32.0*  MCV 97.2 99.3 99.7  PLT 682* 604* 556*   Basic Metabolic Panel: Recent Labs  Lab 12/05/23 2332 12/07/23 0423  NA 133* 135  K 3.8 3.9  CL 94* 100  CO2 30 27  GLUCOSE 112* 105*  BUN 9 12  CREATININE 0.96 0.92  CALCIUM 9.2 8.5*   GFR: Estimated Creatinine Clearance: 73.7 mL/min (by C-G formula based on SCr of 0.92 mg/dL). Liver Function Tests: Recent Labs  Lab 12/05/23 2332 12/07/23 0423  AST 66* 37  ALT 144* 88*  ALKPHOS 308* 254*  BILITOT 0.4 0.2  PROT 6.3* 5.7*  ALBUMIN  2.8* 2.6*   Recent Labs  Lab 12/05/23 2332  LIPASE 42   Recent Labs  Lab 12/05/23 2332  AMMONIA 23   Coagulation Profile: Recent Labs  Lab 12/05/23 2332  INR 1.1   Cardiac Enzymes: No results for input(s): CKTOTAL, CKMB, CKMBINDEX, TROPONINI in the last 168 hours. BNP (last 3 results) No results for input(s): PROBNP in the last 8760 hours. HbA1C: No results for input(s): HGBA1C in the last 72 hours. CBG: No results for input(s): GLUCAP in the last 168 hours. Lipid Profile: No results for input(s): CHOL, HDL, LDLCALC, TRIG, CHOLHDL, LDLDIRECT in the last 72 hours. Thyroid Function Tests: No results for input(s): TSH, T4TOTAL, FREET4, T3FREE, THYROIDAB in the last 72 hours. Anemia Panel: No results for input(s): VITAMINB12, FOLATE, FERRITIN, TIBC, IRON, RETICCTPCT in the last 72 hours. Sepsis Labs: Recent Labs  Lab 12/06/23 0143 12/06/23 0328  LATICACIDVEN 0.9 0.8    Recent Results  (from the past 240 hours)  Surgical pcr screen     Status: None   Collection Time: 11/30/23  6:48 AM   Specimen: Nasal Mucosa; Nasal Swab  Result Value Ref Range Status   MRSA, PCR NEGATIVE NEGATIVE Final   Staphylococcus aureus NEGATIVE NEGATIVE Final    Comment: (NOTE) The Xpert SA Assay (FDA approved for NASAL specimens in patients 26 years of age and older), is one component of a comprehensive surveillance program. It is not intended to diagnose infection nor to guide or monitor treatment. Performed at [facility], [address] [place], [place] [address]       Radiology Studies: VAS US  LOWER EXTREMITY VENOUS (DVT) Result Date: 12/08/2023  Lower Venous DVT Study Patient Name:  Theodore Stevenson  Date of Exam:   12/08/2023 Medical Rec #: [id number]  Accession #:    [id number] Date of Birth: May 21, 1955           Patient Gender: M Patient Age:   45 years Exam Location:  [facility] Procedure:      VAS US  LOWER EXTREMITY VENOUS (DVT) Referring Phys: DELON YATES --------------------------------------------------------------------------------  Indications: Left calf pain.  Risk Factors: Recent surgeries - Cervical discectomy w/ interbody fusion on 11/30/2023 & laproscopic Arlyss patch of perforated ulcer on 11/21/2023. Comparison Study: No previous exams Performing Technologist: Jody Hill RVT, RDMS  Examination Guidelines: A complete evaluation includes B-mode imaging, spectral Doppler, color Doppler, and power Doppler as needed of all accessible portions of each vessel. Bilateral testing is considered an integral part of a complete examination. Limited examinations for reoccurring indications may be performed as noted. The reflux portion of the exam is performed with the patient in reverse Trendelenburg.  +-----+---------------+---------+-----------+----------+--------------+ RIGHTCompressibilityPhasicitySpontaneityPropertiesThrombus Aging  +-----+---------------+---------+-----------+----------+--------------+ CFV  Full           Yes      Yes                                 +-----+---------------+---------+-----------+----------+--------------+   +---------+---------------+---------+-----------+----------+--------------+ LEFT     CompressibilityPhasicitySpontaneityPropertiesThrombus Aging +---------+---------------+---------+-----------+----------+--------------+ CFV      Full           Yes      Yes                                 +---------+---------------+---------+-----------+----------+--------------+ SFJ      Full                                                        +---------+---------------+---------+-----------+----------+--------------+ FV Prox  Full           Yes      Yes                                 +---------+---------------+---------+-----------+----------+--------------+ FV Mid   Full           Yes      Yes                                 +---------+---------------+---------+-----------+----------+--------------+ FV DistalFull           Yes      Yes                                 +---------+---------------+---------+-----------+----------+--------------+ PFV      Full                                                        +---------+---------------+---------+-----------+----------+--------------+ POP      Full           Yes      Yes                                 +---------+---------------+---------+-----------+----------+--------------+  PTV      Full                                                        +---------+---------------+---------+-----------+----------+--------------+ PERO     Full                                                        +---------+---------------+---------+-----------+----------+--------------+    Summary: RIGHT: - No evidence of common femoral vein obstruction.   LEFT: - There is no evidence of deep vein thrombosis in the  lower extremity.  - No cystic structure found in the popliteal fossa.  *See table(s) above for measurements and observations. Electronically signed by Lonni Gaskins MD on 12/08/2023 at 11:00:04 AM.    Final       Scheduled Meds:  hydrochlorothiazide   25 mg Oral Daily   lisinopril   10 mg Oral Daily   pantoprazole   40 mg Oral BID   Continuous Infusions:  ceFEPime  (MAXIPIME ) IV 2 g (12/08/23 1118)   vancomycin  750 mg (12/08/23 0505)     LOS: 2 days   Time spent: 35 minutes   Delon Hoe, DO [facility] 12/08/2023, 2:45 PM   Available via Epic secure chat 7am-7pm After these hours, please refer to coverage provider listed on [email or web address]

## 2023-12-08 NOTE — Plan of Care (Signed)

## 2023-12-08 NOTE — Progress Notes (Signed)
 LLE venous duplex has been completed.   Results can be found under chart review under CV PROC. 12/08/2023 11:08 AM Devion Chriscoe RVT, RDMS

## 2023-12-09 DIAGNOSIS — T8149XA Infection following a procedure, other surgical site, initial encounter: Secondary | ICD-10-CM | POA: Diagnosis not present

## 2023-12-09 LAB — CBC
HCT: 30.6 % — ABNORMAL LOW (ref 39.0–52.0)
Hemoglobin: 9.5 g/dL — ABNORMAL LOW (ref 13.0–17.0)
MCH: 30.6 pg (ref 26.0–34.0)
MCHC: 31 g/dL (ref 30.0–36.0)
MCV: 98.7 fL (ref 80.0–100.0)
Platelets: 495 K/uL — ABNORMAL HIGH (ref 150–400)
RBC: 3.1 MIL/uL — ABNORMAL LOW (ref 4.22–5.81)
RDW: 14.1 % (ref 11.5–15.5)
WBC: 17.5 K/uL — ABNORMAL HIGH (ref 4.0–10.5)
nRBC: 0 % (ref 0.0–0.2)

## 2023-12-09 LAB — BASIC METABOLIC PANEL WITH GFR
Anion gap: 10 (ref 5–15)
BUN: 11 mg/dL (ref 8–23)
CO2: 26 mmol/L (ref 22–32)
Calcium: 8.5 mg/dL — ABNORMAL LOW (ref 8.9–10.3)
Chloride: 102 mmol/L (ref 98–111)
Creatinine, Ser: 0.89 mg/dL (ref 0.61–1.24)
GFR, Estimated: >60 mL/min (ref >60)
Glucose, Bld: 115 mg/dL — ABNORMAL HIGH (ref 70–99)
Potassium: 4 mmol/L (ref 3.5–5.1)
Sodium: 137 mmol/L (ref 135–145)

## 2023-12-09 LAB — CBC WITH DIFFERENTIAL/PLATELET
Abs Immature Granulocytes: 1.4 K/uL — ABNORMAL HIGH (ref 0.00–0.07)
Basophils Absolute: 0 K/uL (ref 0.0–0.1)
Basophils Relative: 0 %
Eosinophils Absolute: 0.2 K/uL (ref 0.0–0.5)
Eosinophils Relative: 1 %
HCT: 28.8 % — ABNORMAL LOW (ref 39.0–52.0)
Hemoglobin: 9.2 g/dL — ABNORMAL LOW (ref 13.0–17.0)
Lymphocytes Relative: 9 %
Lymphs Abs: 1.6 K/uL (ref 0.7–4.0)
MCH: 31.3 pg (ref 26.0–34.0)
MCHC: 31.9 g/dL (ref 30.0–36.0)
MCV: 98 fL (ref 80.0–100.0)
Metamyelocytes Relative: 1 %
Monocytes Absolute: 1.4 K/uL — ABNORMAL HIGH (ref 0.1–1.0)
Monocytes Relative: 8 %
Myelocytes: 6 %
Neutro Abs: 13 K/uL — ABNORMAL HIGH (ref 1.7–7.7)
Neutrophils Relative %: 74 %
Platelets: 417 K/uL — ABNORMAL HIGH (ref 150–400)
Promyelocytes Relative: 1 %
RBC: 2.94 MIL/uL — ABNORMAL LOW (ref 4.22–5.81)
RDW: 13.9 % (ref 11.5–15.5)
WBC: 17.5 K/uL — ABNORMAL HIGH (ref 4.0–10.5)
nRBC: 0.1 % (ref 0.0–0.2)

## 2023-12-09 MED ORDER — METRONIDAZOLE 500 MG/100ML IV SOLN
500.0000 mg | Freq: Two times a day (BID) | INTRAVENOUS | Status: DC
  Administered 2023-12-09 – 2023-12-10 (×4): 500 mg via INTRAVENOUS
  Filled 2023-12-09 (×4): qty 100

## 2023-12-09 MED ORDER — ACETAMINOPHEN 500 MG PO TABS
1000.0000 mg | ORAL_TABLET | Freq: Four times a day (QID) | ORAL | Status: DC
  Administered 2023-12-09 – 2023-12-11 (×5): 1000 mg via ORAL
  Filled 2023-12-09 (×7): qty 2

## 2023-12-09 NOTE — Progress Notes (Signed)
 Subjective/Chief Complaint: Tolerating po but appetite poor   Objective: Vital signs in last 24 hours: Temp:  [97.9 F (36.6 C)-98.8 F (37.1 C)] 98.8 F (37.1 C) (11/27 0650) Pulse Rate:  [67-84] 78 (11/27 0650) Resp:  [18] 18 (11/26 1421) BP: (151-163)/(52-68) 151/52 (11/27 0650) SpO2:  [98 %-100 %] 100 % (11/27 0650) Last BM Date : 12/04/23  Intake/Output from previous day: 11/26 0701 - 11/27 0700 In: 413.2 [IV Piggyback:413.2] Out: -  Intake/Output this shift: No intake/output data recorded.  Exam: Awake and alert Abdomen soft, mild drainage on bandage  Lab Results:  Recent Labs    12/08/23 0446 12/09/23 0435  WBC 16.1* 17.5*  HGB 9.9* 9.5*  HCT 32.0* 30.6*  PLT 556* 495*   BMET Recent Labs    12/07/23 0423 12/09/23 0435  NA 135 137  K 3.9 4.0  CL 100 102  CO2 27 26  GLUCOSE 105* 115*  BUN 12 11  CREATININE 0.92 0.89  CALCIUM 8.5* 8.5*   PT/INR No results for input(s): LABPROT, INR in the last 72 hours. ABG No results for input(s): PHART, HCO3 in the last 72 hours.  Invalid input(s): PCO2, PO2  Studies/Results: CT ABDOMEN PELVIS W CONTRAST Result Date: 12/08/2023 CLINICAL DATA:  Postoperative abdominal pain EXAM: CT ABDOMEN AND PELVIS WITH CONTRAST TECHNIQUE: Multidetector CT imaging of the abdomen and pelvis was performed using the standard protocol following bolus administration of intravenous contrast. RADIATION DOSE REDUCTION: This exam was performed according to the departmental dose-optimization program which includes automated exposure control, adjustment of the mA and/or kV according to patient size and/or use of iterative reconstruction technique. CONTRAST:  OMNIPAQUE  IOHEXOL  300 MG/ML  SOLN COMPARISON:  12/06/2023 FINDINGS: Lower chest: No acute pleural or parenchymal lung disease. Hepatobiliary: Hepatic steatosis. No acute liver abnormality. The gallbladder is unremarkable. Pancreas: Unremarkable. No pancreatic ductal  dilatation or surrounding inflammatory changes. Spleen: Normal in size without focal abnormality. Adrenals/Urinary Tract: Adrenal glands are unremarkable. Kidneys are normal, without renal calculi, focal lesion, or hydronephrosis. Bladder is unremarkable. Stomach/Bowel: No bowel obstruction or ileus. Normal appendix right lower quadrant. Diverticulosis of the descending and sigmoid colon without evidence of acute diverticulitis. Loculated gas and fluid are seen between the gastric antrum and liver capsule, measuring up to 3.9 x 3.5 cm, again tracking along the lesser curvature of the stomach. This has increased in size since the 11/26/2023 and 12/06/2023 exams. Vascular/Lymphatic: Stable aortic atherosclerosis. Borderline enlarged lymphadenopathy most pronounced within the porta hepatis and celiac regions, measuring up to 10 mm in short axis. This is likely reactive. Reproductive: Evaluation of the prostate is limited due to streak artifact from bilateral hip arthroplasties. Other: Stable intraperitoneal fluid collection along the right anterolateral abdominal wall, measuring 4.1 x 1.9 cm. Punctate foci of free gas are again seen in the right upper quadrant compatible with history of perforated ulcer and prior laparoscopy. No abdominal wall hernia. Musculoskeletal: Bilateral hip arthroplasties. No acute or destructive bony abnormalities. IMPRESSION: 1. Increase in size of the loculated gas and fluid collection interposed between the gastric antrum and liver, consistent with known history of perforated gastric ulcer. 2. Stable intraperitoneal fluid collection along the right anterolateral abdominal wall, favor postoperative seroma. Sterility of fluid cannot be assessed by imaging alone. 3. Distal colonic diverticulosis without diverticulitis. 4. Reactive mesenteric lymphadenopathy. 5. Hepatic steatosis. 6.  Aortic Atherosclerosis (ICD10-I70.0). Electronically Signed   By: Ozell Daring M.D.   On: 12/08/2023 22:11    VAS US  LOWER EXTREMITY VENOUS (DVT)  Result Date: 12/08/2023  Lower Venous DVT Study Patient Name:  Theodore Stevenson  Date of Exam:   12/08/2023 Medical Rec #: [id number]          Accession #:    [id number] Date of Birth: August 28, 1955           Patient Gender: M Patient Age:   68 years Exam Location:  [facility] Procedure:      VAS US  LOWER EXTREMITY VENOUS (DVT) Referring Phys: DELON YATES --------------------------------------------------------------------------------  Indications: Left calf pain.  Risk Factors: Recent surgeries - Cervical discectomy w/ interbody fusion on 11/30/2023 & laproscopic Arlyss patch of perforated ulcer on 11/21/2023. Comparison Study: No previous exams Performing Technologist: Jody Hill RVT, RDMS  Examination Guidelines: A complete evaluation includes B-mode imaging, spectral Doppler, color Doppler, and power Doppler as needed of all accessible portions of each vessel. Bilateral testing is considered an integral part of a complete examination. Limited examinations for reoccurring indications may be performed as noted. The reflux portion of the exam is performed with the patient in reverse Trendelenburg.  +-----+---------------+---------+-----------+----------+--------------+ RIGHTCompressibilityPhasicitySpontaneityPropertiesThrombus Aging +-----+---------------+---------+-----------+----------+--------------+ CFV  Full           Yes      Yes                                 +-----+---------------+---------+-----------+----------+--------------+   +---------+---------------+---------+-----------+----------+--------------+ LEFT     CompressibilityPhasicitySpontaneityPropertiesThrombus Aging +---------+---------------+---------+-----------+----------+--------------+ CFV      Full           Yes      Yes                                 +---------+---------------+---------+-----------+----------+--------------+ SFJ      Full                                                         +---------+---------------+---------+-----------+----------+--------------+ FV Prox  Full           Yes      Yes                                 +---------+---------------+---------+-----------+----------+--------------+ FV Mid   Full           Yes      Yes                                 +---------+---------------+---------+-----------+----------+--------------+ FV DistalFull           Yes      Yes                                 +---------+---------------+---------+-----------+----------+--------------+ PFV      Full                                                        +---------+---------------+---------+-----------+----------+--------------+ POP  Full           Yes      Yes                                 +---------+---------------+---------+-----------+----------+--------------+ PTV      Full                                                        +---------+---------------+---------+-----------+----------+--------------+ PERO     Full                                                        +---------+---------------+---------+-----------+----------+--------------+    Summary: RIGHT: - No evidence of common femoral vein obstruction.   LEFT: - There is no evidence of deep vein thrombosis in the lower extremity.  - No cystic structure found in the popliteal fossa.  *See table(s) above for measurements and observations. Electronically signed by Lonni Gaskins MD on 12/08/2023 at 11:00:04 AM.    Final     Anti-infectives: Anti-infectives (From admission, onward)    Start     Dose/Rate Route Frequency Ordered Stop   12/06/23 1700  vancomycin  (VANCOREADY) IVPB 750 mg/150 mL        750 mg 150 mL/hr over 60 Minutes Intravenous Every 12 hours 12/06/23 0443     12/06/23 1000  ceFEPIme  (MAXIPIME ) 2 g in sodium chloride  0.9 % 100 mL IVPB        2 g 200 mL/hr over 30 Minutes Intravenous Every 8 hours 12/06/23 0438      12/06/23 0445  vancomycin  (VANCOREADY) IVPB 1500 mg/300 mL        1,500 mg 150 mL/hr over 120 Minutes Intravenous  Once 12/06/23 0438 12/06/23 0800   12/06/23 0130  ceFEPIme  (MAXIPIME ) 2 g in sodium chloride  0.9 % 100 mL IVPB        2 g 200 mL/hr over 30 Minutes Intravenous  Once 12/06/23 0124 12/06/23 0213   12/06/23 0130  metroNIDAZOLE  (FLAGYL ) IVPB 500 mg        500 mg 100 mL/hr over 60 Minutes Intravenous  Once 12/06/23 0124 12/06/23 0302       Assessment/Plan: Lap graham patch for perforated ulcer 11/9 - SG   -WBC up further today -CT scan yesterday shows increase in size of fluid collection at site of the ulcer perforation.  UGI negative for leak on 11/12.  Fluid collection along the abdominal wall is about the same  -will consult IR for their opinion, consideration of aspiration of fluid at the areas of concern for cultures given increasing WBC.SABRA -question whether IV antibiotics should be changed  I discussed this with the patient's family Vicenta Poli 12/09/2023

## 2023-12-09 NOTE — Progress Notes (Signed)
 PROGRESS NOTE    HASSEL UPHOFF  FMW:[id number] DOB: 04-Oct-1955 DOA: 12/05/2023 PCP: Patient, No Pcp Per     Brief Narrative:  Theodore Stevenson is a 68yo with h/o HTN, gastric ulcer s/p Arlyss patch on 11/9 with JP drain, and chronic back pain due to spinal stenosis who presented on 11/23 for worsening abdominal wound drainage.  CT with postoperative changes and fluid collection 4.4 cm.  Surgery consulted, cleared for diet, appears to be improving without need for intervention.  New events last 24 hours / Subjective: Feeling well this morning, no worsening abdominal pain. Discussed with him and multiple family members at bedside regarding his CT and antibiotics.   Assessment & Plan:  Principal Problem:   Post-operative wound abscess Active Problems:   Hypertension   Chronic left-sided low back pain with left-sided sciatica   Perforated prepyloric ulcer s/p lap omental Arlyss patch/washout 11/21/2023   Cervical spondylosis with myelopathy and radiculopathy  Post-operative wound abscess Perforated prepyloric ulcer, s/p lap omental Graham patch and washout 11/9 - General surgery following - Repeat CT 11/27 showed increase in size of fluid collection. IR consulted for consideration of aspiration of fluid. 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 - S/p uncomplicated C3-4 anterior cervical tenectomy and fusion on 11/18 - PT OT recommending HH therapy    DVT prophylaxis:  SCDs Start: 12/06/23 0427  Code Status: Full Family Communication: At bedside Disposition Plan: Home with home health Status is: Inpatient Remains inpatient appropriate because: IV antibiotics     Antimicrobials:  Anti-infectives (From admission, onward)    Start     Dose/Rate Route Frequency  Ordered Stop   12/09/23 1200  metroNIDAZOLE  (FLAGYL ) IVPB 500 mg        500 mg 100 mL/hr over 60 Minutes Intravenous Every 12 hours 12/09/23 1030     12/06/23 1700  vancomycin  (VANCOREADY) IVPB 750 mg/150 mL  Status:  Discontinued        750 mg 150 mL/hr over 60 Minutes Intravenous Every 12 hours 12/06/23 0443 12/09/23 1030   12/06/23 1000  ceFEPIme  (MAXIPIME ) 2 g in sodium chloride  0.9 % 100 mL IVPB        2 g 200 mL/hr over 30 Minutes Intravenous Every 8 hours 12/06/23 0438     12/06/23 0445  vancomycin  (VANCOREADY) IVPB 1500 mg/300 mL        1,500 mg 150 mL/hr over 120 Minutes Intravenous  Once 12/06/23 0438 12/06/23 0800   12/06/23 0130  ceFEPIme  (MAXIPIME ) 2 g in sodium chloride  0.9 % 100 mL IVPB        2 g 200 mL/hr over 30 Minutes Intravenous  Once 12/06/23 0124 12/06/23 0213   12/06/23 0130  metroNIDAZOLE  (FLAGYL ) IVPB 500 mg        500 mg 100 mL/hr over 60 Minutes Intravenous  Once 12/06/23 0124 12/06/23 0302        Objective: Vitals:   12/08/23 1421 12/08/23 2231 12/09/23 0650 12/09/23 1003  BP: (!) 155/68 (!) 163/60 (!) 151/52 137/67  Pulse: 67 84 78   Resp: 18     Temp: 97.9 F (36.6 C) 98.3 F (36.8 C) 98.8 F (37.1 C)   TempSrc:  Oral Oral   SpO2: 98% 100% 100%   Weight:      Height:  Intake/Output Summary (Last 24 hours) at 12/09/2023 1044 Last data filed at 12/08/2023 1549 Gross per 24 hour  Intake 413.19 ml  Output --  Net 413.19 ml   Filed Weights   12/05/23 2320 12/06/23 0629  Weight: 79.4 kg 81.9 kg    Examination:  General exam: Appears calm and comfortable  Respiratory system: Clear to auscultation. Respiratory effort normal. No respiratory distress. No conversational dyspnea.  Cardiovascular system: S1 & S2 heard, RRR. No murmurs. No pedal edema. Gastrointestinal system: Abdomen is nondistended, soft  Central nervous system: Alert and oriented. No focal neurological deficits. Speech clear.  Extremities: Symmetric in appearance   Skin: No rashes, lesions or ulcers on exposed skin  Psychiatry: Judgement and insight appear normal. Mood & affect appropriate.   Data Reviewed: I have personally reviewed following labs and imaging studies  CBC: Recent Labs  Lab 12/05/23 2332 12/07/23 0423 12/08/23 0446 12/09/23 0435  WBC 15.8* 15.8* 16.1* 17.5*  NEUTROABS 10.0* 9.4* 9.0*  --   HGB 10.0* 9.1* 9.9* 9.5*  HCT 31.6* 29.2* 32.0* 30.6*  MCV 97.2 99.3 99.7 98.7  PLT 682* 604* 556* 495*   Basic Metabolic Panel: Recent Labs  Lab 12/05/23 2332 12/07/23 0423 12/09/23 0435  NA 133* 135 137  K 3.8 3.9 4.0  CL 94* 100 102  CO2 30 27 26   GLUCOSE 112* 105* 115*  BUN 9 12 11   CREATININE 0.96 0.92 0.89  CALCIUM 9.2 8.5* 8.5*   GFR: Estimated Creatinine Clearance: 76.1 mL/min (by C-G formula based on SCr of 0.89 mg/dL). Liver Function Tests: Recent Labs  Lab 12/05/23 2332 12/07/23 0423  AST 66* 37  ALT 144* 88*  ALKPHOS 308* 254*  BILITOT 0.4 0.2  PROT 6.3* 5.7*  ALBUMIN  2.8* 2.6*   Recent Labs  Lab 12/05/23 2332  LIPASE 42   Recent Labs  Lab 12/05/23 2332  AMMONIA 23   Coagulation Profile: Recent Labs  Lab 12/05/23 2332  INR 1.1   Cardiac Enzymes: No results for input(s): CKTOTAL, CKMB, CKMBINDEX, TROPONINI in the last 168 hours. BNP (last 3 results) No results for input(s): PROBNP in the last 8760 hours. HbA1C: No results for input(s): HGBA1C in the last 72 hours. CBG: No results for input(s): GLUCAP in the last 168 hours. Lipid Profile: No results for input(s): CHOL, HDL, LDLCALC, TRIG, CHOLHDL, LDLDIRECT in the last 72 hours. Thyroid Function Tests: No results for input(s): TSH, T4TOTAL, FREET4, T3FREE, THYROIDAB in the last 72 hours. Anemia Panel: No results for input(s): VITAMINB12, FOLATE, FERRITIN, TIBC, IRON, RETICCTPCT in the last 72 hours. Sepsis Labs: Recent Labs  Lab 12/06/23 0143 12/06/23 0328  LATICACIDVEN 0.9 0.8     Recent Results (from the past 240 hours)  Surgical pcr screen     Status: None   Collection Time: 11/30/23  6:48 AM   Specimen: Nasal Mucosa; Nasal Swab  Result Value Ref Range Status   MRSA, PCR NEGATIVE NEGATIVE Final   Staphylococcus aureus NEGATIVE NEGATIVE Final    Comment: (NOTE) The Xpert SA Assay (FDA approved for NASAL specimens in patients 59 years of age and older), is one component of a comprehensive surveillance program. It is not intended to diagnose infection nor to guide or monitor treatment. Performed at [facility], [address] [place], [place] [address]       Radiology Studies: CT ABDOMEN PELVIS W CONTRAST Result Date: 12/08/2023 CLINICAL DATA:  Postoperative abdominal pain EXAM: CT ABDOMEN AND PELVIS WITH CONTRAST TECHNIQUE: Multidetector CT imaging  of the abdomen and pelvis was performed using the standard protocol following bolus administration of intravenous contrast. RADIATION DOSE REDUCTION: This exam was performed according to the departmental dose-optimization program which includes automated exposure control, adjustment of the mA and/or kV according to patient size and/or use of iterative reconstruction technique. CONTRAST:  OMNIPAQUE  IOHEXOL  300 MG/ML  SOLN COMPARISON:  12/06/2023 FINDINGS: Lower chest: No acute pleural or parenchymal lung disease. Hepatobiliary: Hepatic steatosis. No acute liver abnormality. The gallbladder is unremarkable. Pancreas: Unremarkable. No pancreatic ductal dilatation or surrounding inflammatory changes. Spleen: Normal in size without focal abnormality. Adrenals/Urinary Tract: Adrenal glands are unremarkable. Kidneys are normal, without renal calculi, focal lesion, or hydronephrosis. Bladder is unremarkable. Stomach/Bowel: No bowel obstruction or ileus. Normal appendix right lower quadrant. Diverticulosis of the descending and sigmoid colon without evidence of acute diverticulitis. Loculated gas and fluid are seen  between the gastric antrum and liver capsule, measuring up to 3.9 x 3.5 cm, again tracking along the lesser curvature of the stomach. This has increased in size since the 11/26/2023 and 12/06/2023 exams. Vascular/Lymphatic: Stable aortic atherosclerosis. Borderline enlarged lymphadenopathy most pronounced within the porta hepatis and celiac regions, measuring up to 10 mm in short axis. This is likely reactive. Reproductive: Evaluation of the prostate is limited due to streak artifact from bilateral hip arthroplasties. Other: Stable intraperitoneal fluid collection along the right anterolateral abdominal wall, measuring 4.1 x 1.9 cm. Punctate foci of free gas are again seen in the right upper quadrant compatible with history of perforated ulcer and prior laparoscopy. No abdominal wall hernia. Musculoskeletal: Bilateral hip arthroplasties. No acute or destructive bony abnormalities. IMPRESSION: 1. Increase in size of the loculated gas and fluid collection interposed between the gastric antrum and liver, consistent with known history of perforated gastric ulcer. 2. Stable intraperitoneal fluid collection along the right anterolateral abdominal wall, favor postoperative seroma. Sterility of fluid cannot be assessed by imaging alone. 3. Distal colonic diverticulosis without diverticulitis. 4. Reactive mesenteric lymphadenopathy. 5. Hepatic steatosis. 6.  Aortic Atherosclerosis (ICD10-I70.0). Electronically Signed   By: Ozell Daring M.D.   On: 12/08/2023 22:11   VAS US  LOWER EXTREMITY VENOUS (DVT) Result Date: 12/08/2023  Lower Venous DVT Study Patient Name:  Theodore Stevenson  Date of Exam:   12/08/2023 Medical Rec #: [id number]          Accession #:    [id number] Date of Birth: 07-07-1955           Patient Gender: M Patient Age:   56 years Exam Location:  [facility] Procedure:      VAS US  LOWER EXTREMITY VENOUS (DVT) Referring Phys: DELON YATES  --------------------------------------------------------------------------------  Indications: Left calf pain.  Risk Factors: Recent surgeries - Cervical discectomy w/ interbody fusion on 11/30/2023 & laproscopic Arlyss patch of perforated ulcer on 11/21/2023. Comparison Study: No previous exams Performing Technologist: Jody Hill RVT, RDMS  Examination Guidelines: A complete evaluation includes B-mode imaging, spectral Doppler, color Doppler, and power Doppler as needed of all accessible portions of each vessel. Bilateral testing is considered an integral part of a complete examination. Limited examinations for reoccurring indications may be performed as noted. The reflux portion of the exam is performed with the patient in reverse Trendelenburg.  +-----+---------------+---------+-----------+----------+--------------+ RIGHTCompressibilityPhasicitySpontaneityPropertiesThrombus Aging +-----+---------------+---------+-----------+----------+--------------+ CFV  Full           Yes      Yes                                 +-----+---------------+---------+-----------+----------+--------------+   +---------+---------------+---------+-----------+----------+--------------+  LEFT     CompressibilityPhasicitySpontaneityPropertiesThrombus Aging +---------+---------------+---------+-----------+----------+--------------+ CFV      Full           Yes      Yes                                 +---------+---------------+---------+-----------+----------+--------------+ SFJ      Full                                                        +---------+---------------+---------+-----------+----------+--------------+ FV Prox  Full           Yes      Yes                                 +---------+---------------+---------+-----------+----------+--------------+ FV Mid   Full           Yes      Yes                                  +---------+---------------+---------+-----------+----------+--------------+ FV DistalFull           Yes      Yes                                 +---------+---------------+---------+-----------+----------+--------------+ PFV      Full                                                        +---------+---------------+---------+-----------+----------+--------------+ POP      Full           Yes      Yes                                 +---------+---------------+---------+-----------+----------+--------------+ PTV      Full                                                        +---------+---------------+---------+-----------+----------+--------------+ PERO     Full                                                        +---------+---------------+---------+-----------+----------+--------------+    Summary: RIGHT: - No evidence of common femoral vein obstruction.   LEFT: - There is no evidence of deep vein thrombosis in the lower extremity.  - No cystic structure found in the popliteal fossa.  *See table(s) above for measurements and observations. Electronically signed by Lonni Gaskins MD on 12/08/2023 at 11:00:04 AM.    Final  Scheduled Meds:  acetaminophen   1,000 mg Oral Q6H   hydrochlorothiazide   25 mg Oral Daily   lisinopril   20 mg Oral Daily   pantoprazole   40 mg Oral BID   Continuous Infusions:  ceFEPime  (MAXIPIME ) IV 2 g (12/09/23 1002)   metronidazole        LOS: 3 days   Time spent: 35 minutes   Delon Hoe, DO [facility] 12/09/2023, 10:44 AM   Available via Epic secure chat 7am-7pm After these hours, please refer to coverage provider listed on [email or web address]

## 2023-12-09 NOTE — Consult Note (Signed)
 Chief Complaint: Patient was seen in consultation today for abdominal wall and abdominal fluid collections, with consideration for aspiration and possible drainage.  Referring Provider(s): Dr. Elspeth Schultze, MD   Supervising Physician: Jenna Hacker  Patient Status: [facility] - In-pt  Patient is Full Code  History of Present Illness: Theodore Stevenson is a 68 y.o. male  with PMHx notable for gastric ulcer s/p Arlyss patch on 11/9 with JP drain, complicated by fluid collections, HTN, hepatitis, arthritis, and spinal stenosis.  Per Dr. Bella progress note today: Theodore Stevenson is a 68yo with h/o HTN, gastric ulcer s/p Arlyss patch on 11/9 with JP drain, and chronic back pain due to spinal stenosis who presented on 11/23 for worsening abdominal wound drainage.  CT with postoperative changes and fluid collection 4.4 cm.  Surgery consulted, cleared for diet, appears to be improving without need for intervention.   [...] Post-operative wound abscess Perforated prepyloric ulcer, s/p lap omental Arlyss patch and washout 11/9 - General surgery following - Repeat CT 11/27 showed increase in size of fluid collection. IR consulted for consideration of aspiration of fluid. Also with worsening WBC today  - Continue cefepime , vanco --> change to cefepime , flagyl  today.  Interventional Radiology was requested for abdominal wall fluid collection aspiration, as well as drainage of peripyloric gas/ fluid pocket to rule out abscess/delayed leak. Request was reviewed by Dr. Jenna, who has approved the abdominal wall collection aspiration. Dr. Jenna has deferred approval for the abdominal fluid collection drainage to Dr. Johann, VIR attending tomorrow.  Patient is tentatively scheduled for abdominal wall fluid collection aspiration tomorrow, as well as abdominal fluid collection drainage, pending approval.  Patient is alert and laying in bed, calm. Wife and three daughters are at his  bedside. Patient is currently without any significant complaints. He feels well. He does note he has no appetite. Patient denies any fevers, headache, chest pain, SOB, cough, abdominal pain, nausea, vomiting or bleeding.    Past Medical History:  Diagnosis Date   Arthritis    Hepatitis    1973   Hypertension    Spinal stenosis     Past Surgical History:  Procedure Laterality Date   ANTERIOR CERVICAL DECOMP/DISCECTOMY FUSION N/A 11/30/2023   Procedure: ANTERIOR CERVICAL DECOMPRESSION/DISCECTOMY FUSION CERVICAL THREE-FOUR;  Surgeon: Louis Shove, MD;  Location: [facility] OR;  Service: Neurosurgery;  Laterality: N/A;   BACK SURGERY     1987   CARPAL TUNNEL RELEASE     right and left   HIP SURGERY  06/30/2011   total left hip   JOINT REPLACEMENT     right total hip   LAPAROSCOPY  11/21/2023   Procedure: LAPAROSCOPY, DIAGNOSTIC;  Surgeon: Schultze Elspeth, MD;  Location: [facility] ORS;  Service: General;;  drainage of abscess; omental graham patch   TOTAL HIP ARTHROPLASTY  06/30/2011   Procedure: TOTAL HIP ARTHROPLASTY;  Surgeon: Maude LELON Right, MD;  Location: [facility] OR;  Service: Orthopedics;  Laterality: Left;    Allergies: Codeine, Nsaids, and Penicillins  Medications: Prior to Admission medications   Medication Sig Start Date End Date Taking? Authorizing Provider  acetaminophen  (TYLENOL ) 325 MG tablet Take 2 tablets (650 mg total) by mouth every 6 (six) hours as needed for mild pain (pain score 1-3) or moderate pain (pain score 4-6). 11/29/23  Yes Tammy Sor, PA-C  doxycycline (VIBRA-TABS) 100 MG tablet Take 100 mg by mouth 2 (two) times daily. 12/04/23 12/09/23 Yes [provider]  hydrochlorothiazide  (HYDRODIURIL ) 25 MG tablet Take  1 tablet (25 mg total) by mouth daily. 11/30/23  Yes Lue Elsie BROCKS, MD  lisinopril  (ZESTRIL ) 10 MG tablet Take 1 tablet (10 mg total) by mouth daily. 11/30/23  Yes Lue Elsie BROCKS, MD  pantoprazole  (PROTONIX ) 40 MG tablet Take 1 tablet (40 mg  total) by mouth 2 (two) times daily. 11/29/23 02/27/24 Yes Tammy Sor, PA-C  traMADol  (ULTRAM ) 50 MG tablet Take 0.5-1 tablets (25-50 mg total) by mouth every 12 (twelve) hours as needed for severe pain (pain score 7-10). 11/29/23  Yes Tammy Sor, PA-C  cyclobenzaprine  (FLEXERIL ) 10 MG tablet Take 1 tablet (10 mg total) by mouth 3 (three) times daily as needed for muscle spasms. Patient not taking: Reported on 12/06/2023 11/30/23   Louis Shove, MD  HYDROcodone -acetaminophen  (NORCO/VICODIN) 5-325 MG tablet Take 1 tablet by mouth every 4 (four) hours as needed for moderate pain (pain score 4-6) ((score 4 to 6)). Patient not taking: Reported on 12/06/2023 11/30/23   Louis Shove, MD     No family history on file.  Social History   Socioeconomic History   Marital status: Married    Spouse name: Not on file   Number of children: Not on file   Years of education: Not on file   Highest education level: Not on file  Occupational History   Not on file  Tobacco Use   Smoking status: Former    Current packs/day: 0.00    Average packs/day: 1 pack/day for 10.0 years (10.0 ttl pk-yrs)    Types: Cigarettes    Start date: 06/21/1996    Quit date: 06/22/2006    Years since quitting: 17.4   Smokeless tobacco: Current    Types: Chew  Vaping Use   Vaping status: Never Used  Substance and Sexual Activity   Alcohol use: No   Drug use: No   Sexual activity: Yes  Other Topics Concern   Not on file  Social History Narrative   Not on file   Social Drivers of Health   Financial Resource Strain: Not on file  Food Insecurity: No Food Insecurity (12/06/2023)   Hunger Vital Sign    Worried About Running Out of Food in the Last Year: Never true    Ran Out of Food in the Last Year: Never true  Transportation Needs: No Transportation Needs (12/06/2023)   PRAPARE - Administrator, Civil Service (Medical): No    Lack of Transportation (Non-Medical): No  Physical Activity: Not on file   Stress: Not on file  Social Connections: Moderately Integrated (12/06/2023)   Social Connection and Isolation Panel    Frequency of Communication with Friends and Family: More than three times a week    Frequency of Social Gatherings with Friends and Family: More than three times a week    Attends Religious Services: More than 4 times per year    Active Member of [employer] or Organizations: No    Attends Banker Meetings: Never    Marital Status: Married     Review of Systems: A 12 point ROS discussed and pertinent positives are indicated in the HPI above.  All other systems are negative.  Vital Signs: BP 137/67   Pulse 78   Temp 98.8 F (37.1 C) (Oral)   Resp 18   Ht 5' 5 (1.651 m)   Wt 180 lb 8.9 oz (81.9 kg)   SpO2 100%   BMI 30.05 kg/m   Advance Care Plan: The advanced care place/surrogate decision maker was discussed  at the time of visit and the patient did not wish to discuss or was not able to name a surrogate decision maker or provide an advance care plan.  Physical Exam Vitals reviewed.  Constitutional:      General: He is not in acute distress.    Appearance: Normal appearance.  HENT:     Mouth/Throat:     Mouth: Mucous membranes are moist.     Pharynx: Oropharynx is clear.  Cardiovascular:     Rate and Rhythm: Normal rate and regular rhythm.     Heart sounds: Normal heart sounds.  Pulmonary:     Effort: Pulmonary effort is normal.     Breath sounds: Normal breath sounds.  Abdominal:     General: Abdomen is flat.     Palpations: Abdomen is soft. There is mass.     Tenderness: There is no abdominal tenderness.     Comments: Approximately 3 cm non-tender bulge on RUQ, below liver edge, lateral to nipple line. Bulge is non-fluctuant, non pulsatile. No bruising.  Musculoskeletal:        General: Normal range of motion.     Cervical back: Normal range of motion.  Skin:    General: Skin is warm and dry.  Neurological:     Mental Status: He is  alert and oriented to person, place, and time.  Psychiatric:        Mood and Affect: Mood normal.        Behavior: Behavior normal.        Thought Content: Thought content normal.        Judgment: Judgment normal.     Imaging: CT ABDOMEN PELVIS W CONTRAST Result Date: 12/08/2023 CLINICAL DATA:  Postoperative abdominal pain EXAM: CT ABDOMEN AND PELVIS WITH CONTRAST TECHNIQUE: Multidetector CT imaging of the abdomen and pelvis was performed using the standard protocol following bolus administration of intravenous contrast. RADIATION DOSE REDUCTION: This exam was performed according to the departmental dose-optimization program which includes automated exposure control, adjustment of the mA and/or kV according to patient size and/or use of iterative reconstruction technique. CONTRAST:  OMNIPAQUE  IOHEXOL  300 MG/ML  SOLN COMPARISON:  12/06/2023 FINDINGS: Lower chest: No acute pleural or parenchymal lung disease. Hepatobiliary: Hepatic steatosis. No acute liver abnormality. The gallbladder is unremarkable. Pancreas: Unremarkable. No pancreatic ductal dilatation or surrounding inflammatory changes. Spleen: Normal in size without focal abnormality. Adrenals/Urinary Tract: Adrenal glands are unremarkable. Kidneys are normal, without renal calculi, focal lesion, or hydronephrosis. Bladder is unremarkable. Stomach/Bowel: No bowel obstruction or ileus. Normal appendix right lower quadrant. Diverticulosis of the descending and sigmoid colon without evidence of acute diverticulitis. Loculated gas and fluid are seen between the gastric antrum and liver capsule, measuring up to 3.9 x 3.5 cm, again tracking along the lesser curvature of the stomach. This has increased in size since the 11/26/2023 and 12/06/2023 exams. Vascular/Lymphatic: Stable aortic atherosclerosis. Borderline enlarged lymphadenopathy most pronounced within the porta hepatis and celiac regions, measuring up to 10 mm in short axis. This is likely  reactive. Reproductive: Evaluation of the prostate is limited due to streak artifact from bilateral hip arthroplasties. Other: Stable intraperitoneal fluid collection along the right anterolateral abdominal wall, measuring 4.1 x 1.9 cm. Punctate foci of free gas are again seen in the right upper quadrant compatible with history of perforated ulcer and prior laparoscopy. No abdominal wall hernia. Musculoskeletal: Bilateral hip arthroplasties. No acute or destructive bony abnormalities. IMPRESSION: 1. Increase in size of the loculated gas and fluid collection interposed  between the gastric antrum and liver, consistent with known history of perforated gastric ulcer. 2. Stable intraperitoneal fluid collection along the right anterolateral abdominal wall, favor postoperative seroma. Sterility of fluid cannot be assessed by imaging alone. 3. Distal colonic diverticulosis without diverticulitis. 4. Reactive mesenteric lymphadenopathy. 5. Hepatic steatosis. 6.  Aortic Atherosclerosis (ICD10-I70.0). Electronically Signed   By: Ozell Daring M.D.   On: 12/08/2023 22:11   VAS US  LOWER EXTREMITY VENOUS (DVT) Result Date: 12/08/2023  Lower Venous DVT Study Patient Name:  Theodore Stevenson  Date of Exam:   12/08/2023 Medical Rec #: [id number]          Accession #:    [id number] Date of Birth: [birth year]           Patient Gender: M Patient Age:   29 years Exam Location:  [facility] Procedure:      VAS US  LOWER EXTREMITY VENOUS (DVT) Referring Phys: DELON YATES --------------------------------------------------------------------------------  Indications: Left calf pain.  Risk Factors: Recent surgeries - Cervical discectomy w/ interbody fusion on 11/30/2023 & laproscopic Arlyss patch of perforated ulcer on 11/21/2023. Comparison Study: No previous exams Performing Technologist: Jody Hill RVT, RDMS  Examination Guidelines: A complete evaluation includes B-mode imaging, spectral Doppler, color Doppler, and power Doppler  as needed of all accessible portions of each vessel. Bilateral testing is considered an integral part of a complete examination. Limited examinations for reoccurring indications may be performed as noted. The reflux portion of the exam is performed with the patient in reverse Trendelenburg.  +-----+---------------+---------+-----------+----------+--------------+ RIGHTCompressibilityPhasicitySpontaneityPropertiesThrombus Aging +-----+---------------+---------+-----------+----------+--------------+ CFV  Full           Yes      Yes                                 +-----+---------------+---------+-----------+----------+--------------+   +---------+---------------+---------+-----------+----------+--------------+ LEFT     CompressibilityPhasicitySpontaneityPropertiesThrombus Aging +---------+---------------+---------+-----------+----------+--------------+ CFV      Full           Yes      Yes                                 +---------+---------------+---------+-----------+----------+--------------+ SFJ      Full                                                        +---------+---------------+---------+-----------+----------+--------------+ FV Prox  Full           Yes      Yes                                 +---------+---------------+---------+-----------+----------+--------------+ FV Mid   Full           Yes      Yes                                 +---------+---------------+---------+-----------+----------+--------------+ FV DistalFull           Yes      Yes                                 +---------+---------------+---------+-----------+----------+--------------+  PFV      Full                                                        +---------+---------------+---------+-----------+----------+--------------+ POP      Full           Yes      Yes                                 +---------+---------------+---------+-----------+----------+--------------+  PTV      Full                                                        +---------+---------------+---------+-----------+----------+--------------+ PERO     Full                                                        +---------+---------------+---------+-----------+----------+--------------+    Summary: RIGHT: - No evidence of common femoral vein obstruction.   LEFT: - There is no evidence of deep vein thrombosis in the lower extremity.  - No cystic structure found in the popliteal fossa.  *See table(s) above for measurements and observations. Electronically signed by Lonni Gaskins MD on 12/08/2023 at 11:00:04 AM.    Final    CT Head Wo Contrast Result Date: 12/06/2023 EXAM: CT HEAD WITHOUT CONTRAST 12/06/2023 02:13:30 AM TECHNIQUE: CT of the head was performed without the administration of intravenous contrast. Automated exposure control, iterative reconstruction, and/or weight based adjustment of the mA/kV was utilized to reduce the radiation dose to as low as reasonably achievable. COMPARISON: 11/26/2023 CLINICAL HISTORY: Mental status change, unknown cause. FINDINGS: BRAIN AND VENTRICLES: No acute hemorrhage. No evidence of acute infarct. No hydrocephalus. No extra-axial collection. No mass effect or midline shift. Stable mild periventricular chronic small vessel ischemic change. ORBITS: No acute abnormality. SINUSES: Mild mucosal thickening of right sphenoid sinus. SOFT TISSUES AND SKULL: No acute soft tissue abnormality. No skull fracture. Atherosclerosis of skullbase vasculature. IMPRESSION: 1. No acute intracranial abnormality. 2. Stable mild periventricular chronic small vessel ischemic change. Electronically signed by: Franky Stanford MD 12/06/2023 02:29 AM EST RP Workstation: [id number]   DG Chest Port 1 View Result Date: 12/06/2023 EXAM: 1 VIEW(S) XRAY OF THE CHEST 12/06/2023 01:46:48 AM COMPARISON: 09/12/2010 CLINICAL HISTORY: ams ams FINDINGS: LUNGS AND PLEURA: No focal pulmonary  opacity. No pleural effusion. No pneumothorax. HEART AND MEDIASTINUM: No acute abnormality of the cardiac and mediastinal silhouettes. BONES AND SOFT TISSUES: No acute osseous abnormality. IMPRESSION: 1. No acute cardiopulmonary process. Electronically signed by: Franky Crease MD 12/06/2023 01:50 AM EST RP Workstation: [id number]   CT ABDOMEN PELVIS W CONTRAST Result Date: 12/06/2023 EXAM: CT ABDOMEN AND PELVIS WITH CONTRAST 12/06/2023 12:51:08 AM TECHNIQUE: CT of the abdomen and pelvis was performed with the administration of 100 mL of iohexol  (OMNIPAQUE ) 300 MG/ML solution. Multiplanar reformatted images are provided for review. Automated exposure control, iterative reconstruction, and/or weight-based adjustment of the mA/kV was utilized  to reduce the radiation dose to as low as reasonably achievable. COMPARISON: 11/26/2023 CLINICAL HISTORY: Abdominal pain, acute, nonlocalized. Prior laparoscopy on 11/21/2023. FINDINGS: LOWER CHEST: Minimal right basilar atelectasis is noted. No sizable effusion is seen. LIVER: The liver is fatty infiltrated. GALLBLADDER AND BILE DUCTS: The gallbladder is well distended, similar to that seen on the prior exam. No biliary ductal dilatation. SPLEEN: Spleen is within normal limits. PANCREAS: Pancreas is within normal limits. ADRENAL GLANDS: Adrenal glands are unremarkable. KIDNEYS, URETERS AND BLADDER: Kidneys demonstrate a normal enhancement pattern. No stones in the kidneys or ureters. The bladder is well distended. GI AND BOWEL: Stomach demonstrates no acute abnormality. The small bowel is within normal limits. Scattered diverticular changes of the colon are noted without evidence of diverticulitis. The appendix is within normal limits. Changes consistent with the known perforated gas ulcer are seen with postsurgical changes. No recurrent perforation is identified. There is no bowel obstruction. PERITONEUM AND RETROPERITONEUM: Minimal intraabdominal air is noted but improved  when compared with the prior exam. A small fluid collection measuring 4.4 x 2.1 cm is noted in the right upper quadrant, best seen on image 40 of series 2 in the previous tract of the drainage catheter. This may represent a small postoperative seroma. No findings to suggest active extravasation are seen. Some fluid is noted within the omentum, similar to that seen on prior exams in part due to the prior surgery as well as some previous fluid within the abdomen. No ascites. VASCULATURE: Aorta is normal in caliber. Aortic calcifications are seen. LYMPH NODES: No lymphadenopathy. REPRODUCTIVE ORGANS: The prostate is not well visualized on this exam due to scattered artifact from bilateral hips prostheses. BONES AND SOFT TISSUES: The prior surgical drain in the right upper quadrant has been removed. Bony structures appear within normal limits given the patient's age. IMPRESSION: 1. Postsurgical changes related to known perforated ulcer with no recurrent perforation identified. 2. Small right upper quadrant fluid collection (4.4 x 2.1 cm) in the prior drainage catheter tract, possibly a postoperative seroma. 3. Scattered diverticular changes of the colon without evidence of diverticulitis. Electronically signed by: Oneil Devonshire MD 12/06/2023 01:00 AM EST RP Workstation: [id number]   DG Cervical Spine 1 View Result Date: 11/30/2023 CLINICAL DATA:  Status post ACDF at C3-C4. EXAM: DG CERVICAL SPINE - 1 VIEW COMPARISON:  11/17/2023. FINDINGS: Multiple intraoperative fluoroscopic spot images are provided. Interval ACDF at C3-C4 with interbody cage. Total fluoroscopy time: 4.7 seconds Total dose: Radiation Exposure Index (as provided by the fluoroscopic device): 0.4 mGy air Kerma Please see intraoperative findings for further detail. IMPRESSION: Intraoperative fluoroscopy of interval ACDF at C3-C4. Electronically Signed   By: Harrietta Sherry M.D.   On: 11/30/2023 13:43   DG C-Arm 1-60 Min-No Report Result Date:  11/30/2023 Fluoroscopy was utilized by the requesting physician.  No radiographic interpretation.   DG C-Arm 1-60 Min-No Report Result Date: 11/30/2023 Fluoroscopy was utilized by the requesting physician.  No radiographic interpretation.   CT ABDOMEN PELVIS W CONTRAST Result Date: 11/26/2023 CLINICAL DATA:  Status post repair perforated gastric ulcer on 11/21/2023. Confusion postoperatively. EXAM: CT ABDOMEN AND PELVIS WITH CONTRAST TECHNIQUE: Multidetector CT imaging of the abdomen and pelvis was performed using the standard protocol following bolus administration of intravenous contrast. RADIATION DOSE REDUCTION: This exam was performed according to the departmental dose-optimization program which includes automated exposure control, adjustment of the mA and/or kV according to patient size and/or use of iterative reconstruction technique. CONTRAST:  OMNIPAQUE  IOHEXOL  300  MG/ML  SOLN COMPARISON:  None Available. FINDINGS: Lower chest: Small bilateral pleural effusions, right greater than left. Associated right basilar atelectasis. Hepatobiliary: Stable hepatic steatosis. Unremarkable gallbladder. No biliary ductal dilatation. Pancreas: Unremarkable. No pancreatic ductal dilatation or surrounding inflammatory changes. Spleen: No splenic injury or perisplenic hematoma. Adrenals/Urinary Tract: Adrenal glands are unremarkable. Kidneys are normal, without renal calculi, focal lesion, or hydronephrosis. Bladder is unremarkable. Stomach/Bowel: No bowel obstruction or significant ileus. There is some fluid in air in the stomach. Small amount of free intraperitoneal air remains in the anterior peritoneal cavity, likely related to recent surgery. Surgical drain terminates adjacent to the liver and just superior to the pyloric region. Vascular/Lymphatic: Atherosclerosis of the abdominal aorta without aneurysm. No lymphadenopathy identified. Reproductive: Prostate is unremarkable. Other: Scattered small amount  of free fluid in the peritoneal cavity, primarily in the lower abdomen and pelvis. No focal marginated abscess identified. Bladder contains a Foley catheter. Musculoskeletal: Degenerative disc disease of the lower lumbar spine. Bilateral hip arthroplasty. IMPRESSION: 1. Small amount of free intraperitoneal air remains in the anterior peritoneal cavity, likely related to recent surgery. 2. Scattered small amount of free fluid in the peritoneal cavity, primarily in the lower abdomen and pelvis. No focal marginated abscess identified. 3. Small bilateral pleural effusions, right greater than left. Associated right basilar atelectasis. 4. Stable hepatic steatosis. 5. Aortic atherosclerosis. Electronically Signed   By: Marcey Moan M.D.   On: 11/26/2023 15:53   CT HEAD WO CONTRAST ( ) Result Date: 11/26/2023 EXAM: CT HEAD WITHOUT CONTRAST 11/26/2023 01:41:03 PM TECHNIQUE: CT of the head was performed without the administration of intravenous contrast. Automated exposure control, iterative reconstruction, and/or weight based adjustment of the mA/kV was utilized to reduce the radiation dose to as low as reasonably achievable. COMPARISON: None available. CLINICAL HISTORY: 68 year old male. Headache, increasing frequency or severity. FINDINGS: BRAIN AND VENTRICLES: Normal brain volume for age. Mild for age mostly periventricular white matter hypodensity. Otherwise normal gray white differentiation. Normal basilar cisterns. No acute hemorrhage. No evidence of acute infarct. No hydrocephalus. No extra-axial collection. No mass effect or midline shift. Calcified atherosclerosis at the skull base. No suspicious intracranial vascular hyperdensity. ORBITS: No acute abnormality. SINUSES: Mild to moderate bilateral paranasal sinus mucosal thickening, mild bubbly opacity associated. No sinus fluid levels. Tympanic cavities and mastoids appear clear. SOFT TISSUES AND SKULL: Mild left posterior convexity scalp soft tissue  scarring. No skull fracture. IMPRESSION: 1. No acute intracranial abnormality. Mild for age cerebral white matter changes most commonly due to small vessel disease. 2. Mild to moderate bilateral paranasal sinus inflammation. Electronically signed by: Helayne Hurst MD 11/26/2023 02:07 PM EST RP Workstation: [id number]   DG UGI W SINGLE CM (SOL OR THIN BA) Result Date: 11/24/2023 CLINICAL DATA:  Postop from [facility] patch repair of perforated gastric ulcer. Evaluate for postop leak. EXAM: WATER  SOLUBLE UPPER GI SERIES WITH KUB TECHNIQUE: Single-column upper GI series was performed using 200 mL water  soluble Omnipaque  300 contrast. Radiation Exposure Index (as provided by the fluoroscopic device): 65.2 mGy Kerma COMPARISON:  None Available. FINDINGS: Scout radiograph shows a nasogastric tube, with tip in the gastric antrum. Right abdominal surgical drain also seen. Bowel gas pattern is normal. Single contrast upper GI series was performed during administration water -soluble contrast through the patient's existing nasogastric tube. There is no evidence of contrast leak or extravasation from the stomach or duodenum. Wall irregularity and luminal narrowing is seen involving the distal gastric antrum and duodenum bulb, most likely due to postop edema at  site of ulcer repair. No evidence of gastric outlet or duodenum obstruction. IMPRESSION: No evidence of postop contrast leak or obstruction. Luminal narrowing and wall irregularity of the distal gastric antrum and duodenum bulb, most likely due to postop edema. Electronically Signed   By: Norleen DELENA Kil M.D.   On: 11/24/2023 11:45   DG Abd Portable 1 View Result Date: 11/19/2023 EXAM: 1 VIEW XRAY OF THE ABDOMEN 11/19/2023 08:37:00 PM COMPARISON: None available. CLINICAL HISTORY: NGT placement. FINDINGS: LINES, TUBES AND DEVICES: A gastric catheter is noted in the distal stomach. BOWEL: No free air is seen. BONES: No acute osseous abnormality. IMPRESSION: 1. Gastric  catheter in the distal stomach. 2. No free intraperitoneal air. Electronically signed by: Oneil Devonshire MD 11/19/2023 08:41 PM EST RP Workstation: [id number]   CT ABDOMEN PELVIS WO CONTRAST Result Date: 11/19/2023 EXAM: CT ABDOMEN AND PELVIS WITHOUT CONTRAST 11/19/2023 06:08:57 PM TECHNIQUE: CT of the abdomen and pelvis was performed without the administration of intravenous contrast. Multiplanar reformatted images are provided for review. Automated exposure control, iterative reconstruction, and/or weight-based adjustment of the mA/kV was utilized to reduce the radiation dose to as low as reasonably achievable. COMPARISON: CT 07/24/2019 CLINICAL HISTORY: Abdominal pain, acute, nonlocalized. FINDINGS: LOWER CHEST: No acute abnormality. LIVER: Small collection of gas adjacent to the falciform ligament of the liver on image 30/2. Small amount of gas along the capsule of the liver on image 26. GALLBLADDER AND BILE DUCTS: Gallbladder is unremarkable. No biliary ductal dilatation. SPLEEN: No acute abnormality. PANCREAS: No acute abnormality. ADRENAL GLANDS: No acute abnormality. KIDNEYS, URETERS AND BLADDER: No stones in the kidneys or ureters. No hydronephrosis. No perinephric or periureteral stranding. Bladder poorly evaluated due to streak artifact from the bilateral hip prosthetics. GI AND BOWEL: Stomach demonstrates no acute abnormality. There is no bowel obstruction. There is a small collection of gas positioned between the first portion of the duodenum and the left hepatic lobe on image 30 of series 2. This is seen on coronal image 70 of series 8. There are multiple diverticula of the sigmoid colon but no evidence of acute inflammation or perforation. PERITONEUM AND RETROPERITONEUM: There is a small amount of intraperitoneal free air collecting predominantly in the upper abdomen along the ventral peritoneum. For example, small collection of gas adjacent to the falciform ligament of the liver on image 30/2. Small  amount of gas along the capsule of the liver on image 26. There is a small collection of gas positioned between the first portion of the duodenum and the left hepatic lobe on image 30 of series 2. This is seen on coronal image 70 of series 8. There is mild inflammatory stranding in the right upper quadrant. A small amount of fluid along the right anterior pararenal fascia. There is trace intraperitoneal free fluid in the pelvis on the right on image 69/2. VASCULATURE: Aorta is normal in caliber. LYMPH NODES: No lymphadenopathy. REPRODUCTIVE ORGANS: No acute abnormality. BONES AND SOFT TISSUES: No acute osseous abnormality. No focal soft tissue abnormality. Small amount of intraperitoneal free air predominantly in the upper abdomen with mild inflammatory stranding in the right upper quadrant. Findings are consistent with perforation of the hollow viscus. Most likely source would be proximal duodenum related peptic ulcer disease. Secondary source although less likely would be perforated sigmoid diverticulum. Free fluid in the pelvis. Recommend emergent surgical consultation. Findings conveyed to ordering physician Ruthe, MD at time of interpretation. IMPRESSION: 1. Small amount of intraperitoneal free air predominantly in the upper abdomen with mild  inflammatory stranding in the right upper quadrant, consistent with hollow viscus perforation. Most likely source is proximal duodenum (peptic ulcer disease). A perforated sigmoid diverticulum is less likely. 2. Trace intraperitoneal free fluid in the pelvis. 3. Recommend emergent surgical consultation. 4. Findings conveyed to ordering physician Ruthe, MD at time of interpretation. Electronically signed by: Norleen Boxer MD 11/19/2023 07:02 PM EST RP Workstation: [id number]    Labs:  CBC: Recent Labs    12/05/23 2332 12/07/23 0423 12/08/23 0446 12/09/23 0435  WBC 15.8* 15.8* 16.1* 17.5*  HGB 10.0* 9.1* 9.9* 9.5*  HCT 31.6* 29.2* 32.0* 30.6*  PLT 682*  604* 556* 495*    COAGS: Recent Labs    12/05/23 2332  INR 1.1    BMP: Recent Labs    11/28/23 0440 12/05/23 2332 12/07/23 0423 12/09/23 0435  NA 141 133* 135 137  K 4.0 3.8 3.9 4.0  CL 104 94* 100 102  CO2 25 30 27 26   GLUCOSE 158* 112* 105* 115*  BUN 16 9 12 11   CALCIUM 8.8* 9.2 8.5* 8.5*  CREATININE 0.88 0.96 0.92 0.89  GFRNONAA >60 >60 >60 >60    LIVER FUNCTION TESTS: Recent Labs    11/23/23 0449 11/25/23 1650 12/05/23 2332 12/07/23 0423  BILITOT 0.3 0.5 0.4 0.2  AST 29 141* 66* 37  ALT 35 105* 144* 88*  ALKPHOS 87 386* 308* 254*  PROT 4.8* 5.2* 6.3* 5.7*  ALBUMIN  2.3* 2.4* 2.8* 2.6*    TUMOR MARKERS: No results for input(s): AFPTM, CEA, CA199, CHROMGRNA in the last 8760 hours.  Assessment and Plan: Per Dr. Bella progress note today: [...] Post-operative wound abscess Perforated prepyloric ulcer, s/p lap omental Arlyss patch and washout 11/9 - General surgery following - Repeat CT 11/27 showed increase in size of fluid collection. IR consulted for consideration of aspiration of fluid. Also with worsening WBC today  - Continue cefepime , vanco --> change to cefepime , flagyl  today.  Patient and his family are hesitant about drain placement within the abdominal fluid collection, and may discuss the request amongst themselves more today, and with care team tomorrow. They are tentatively amenable to consenting for this at this time, but may request to delay drain placement vs. refuse it tomorrow. They will base their decision on conversations with Care Team tomorrow, and possibly based on patient's WBC response to new antibiotic course initiated today. Patient will have a CBC repeated in am.  For now, patient will present for tentatively scheduled abdominal wall fluid collection aspiration, and possible drainage of abdominal fluid collection in IR tomorrow, 11/28, under moderate sedation.  Patient will be made NPO at midnight in anticipation of  possible moderate sedation. If patient only undergoes aspiration tomorrow, this may be possible with local anesthesia only.  V[facility] attending will review request for drain placement in AM, and communicate with Care Team on request approval status. Should drain placement be approved, patient will require moderate sedation/NPO status. All labs and medications are within acceptable parameters.  Allergies reviewed: NSAIDs; Penicillins; Codeine.  Risks and benefits of aspiration were discussed with the patient including bleeding, infection, damage to adjacent structures, and sepsis.  Risks and benefits discussed with the patient including bleeding, infection, damage to adjacent structures, bowel perforation/fistula connection, and sepsis.  All were answered, and there is agreement to proceed. Consent signed and in chart.     Thank you for allowing our service to participate in TRAMPAS STETTNER 's care.  Electronically Signed: Carlin DELENA Griffon, PA-C   12/09/2023,  10:40 AM      I spent a total of 40 Minutes in face to face in clinical consultation, greater than 50% of which was counseling/coordinating care for abdominal wall and abdominal fluid collections, with consideration for aspiration and possible drainage.

## 2023-12-09 NOTE — Plan of Care (Signed)
   Problem: Education: Goal: Knowledge of General Education information will improve Description Including pain rating scale, medication(s)/side effects and non-pharmacologic comfort measures Outcome: Progressing   Problem: Health Behavior/Discharge Planning: Goal: Ability to manage health-related needs will improve Outcome: Progressing

## 2023-12-09 NOTE — Plan of Care (Signed)

## 2023-12-10 ENCOUNTER — Inpatient Hospital Stay (HOSPITAL_COMMUNITY)

## 2023-12-10 ENCOUNTER — Encounter (HOSPITAL_COMMUNITY): Payer: Self-pay | Admitting: Internal Medicine

## 2023-12-10 DIAGNOSIS — T8149XA Infection following a procedure, other surgical site, initial encounter: Secondary | ICD-10-CM | POA: Diagnosis not present

## 2023-12-10 LAB — CBC
HCT: 31.2 % — ABNORMAL LOW (ref 39.0–52.0)
Hemoglobin: 10 g/dL — ABNORMAL LOW (ref 13.0–17.0)
MCH: 31.6 pg (ref 26.0–34.0)
MCHC: 32.1 g/dL (ref 30.0–36.0)
MCV: 98.7 fL (ref 80.0–100.0)
Platelets: 408 K/uL — ABNORMAL HIGH (ref 150–400)
RBC: 3.16 MIL/uL — ABNORMAL LOW (ref 4.22–5.81)
RDW: 14 % (ref 11.5–15.5)
WBC: 15.5 K/uL — ABNORMAL HIGH (ref 4.0–10.5)
nRBC: 0 % (ref 0.0–0.2)

## 2023-12-10 MED ORDER — TRAMADOL HCL 50 MG PO TABS
50.0000 mg | ORAL_TABLET | Freq: Four times a day (QID) | ORAL | Status: DC | PRN
  Administered 2023-12-10 (×2): 50 mg via ORAL
  Filled 2023-12-10: qty 1

## 2023-12-10 MED ORDER — FENTANYL CITRATE (PF) 100 MCG/2ML IJ SOLN
INTRAMUSCULAR | Status: AC | PRN
  Administered 2023-12-10: 25 ug via INTRAVENOUS

## 2023-12-10 MED ORDER — FENTANYL CITRATE (PF) 100 MCG/2ML IJ SOLN
INTRAMUSCULAR | Status: AC
  Filled 2023-12-10: qty 2

## 2023-12-10 MED ORDER — MIDAZOLAM HCL 2 MG/2ML IJ SOLN
INTRAMUSCULAR | Status: AC
  Filled 2023-12-10: qty 2

## 2023-12-10 MED ORDER — MIDAZOLAM HCL (PF) 2 MG/2ML IJ SOLN
INTRAMUSCULAR | Status: AC | PRN
  Administered 2023-12-10: 1 mg via INTRAVENOUS

## 2023-12-10 MED ORDER — CYCLOBENZAPRINE HCL 10 MG PO TABS
10.0000 mg | ORAL_TABLET | Freq: Three times a day (TID) | ORAL | Status: DC | PRN
Start: 2023-12-10 — End: 2023-12-11
  Administered 2023-12-10: 10 mg via ORAL
  Filled 2023-12-10: qty 1

## 2023-12-10 NOTE — Progress Notes (Signed)
 OT Cancellation Note  Patient Details Name: Theodore Stevenson MRN: [id number] DOB: [birth year]   Cancelled Treatment:    Reason Eval/Treat Not Completed: Fatigue/lethargy limiting ability to participate  OT arrived for session with patient asleep and family at bedside. Chart review and family report patient for rad procedure later and is resting now. OT will continue to follow acutely.   Madelynn Malson OT/L Acute Rehabilitation Department  [phone]   12/10/2023, 9:37 AM

## 2023-12-10 NOTE — Plan of Care (Signed)
  Problem: Clinical Measurements: Goal: Ability to maintain clinical measurements within normal limits will improve Outcome: Progressing   Problem: Activity: Goal: Risk for activity intolerance will decrease Outcome: Progressing   Problem: Nutrition: Goal: Adequate nutrition will be maintained Outcome: Progressing   Problem: Coping: Goal: Level of anxiety will decrease Outcome: Progressing   Problem: Pain Managment: Goal: General experience of comfort will improve and/or be controlled Outcome: Progressing   Problem: Safety: Goal: Ability to remain free from injury will improve Outcome: Progressing

## 2023-12-10 NOTE — Plan of Care (Signed)
   Problem: Education: Goal: Knowledge of General Education information will improve Description Including pain rating scale, medication(s)/side effects and non-pharmacologic comfort measures Outcome: Progressing

## 2023-12-10 NOTE — Progress Notes (Signed)
 12/10/2023  Theodore Stevenson [id number] 02/07/1955  CARE TEAM: PCP: Patient, No Pcp Per  Outpatient Care Team: Patient Care Team: Patient, No Pcp Per as PCP - General (General Practice)  Inpatient Treatment Team: Treatment Team:  Rojelio Nest, DO [facility], Md, MD Bobbette Loupe, MD Cloud, Ole LOISE Ezzard Naomia KATHEE, RN Darcie Geni HERO, OT Sylvan Nest POUR, PT [place], [place], [place] Wishon, Marry BRAVO, [place] Raynaldo Vina GAILS, RN   Problem List:   Principal Problem:   Post-operative wound abscess Active Problems:   Hypertension   Chronic left-sided low back pain with left-sided sciatica   Perforated prepyloric ulcer s/p lap omental Arlyss patch/washout 11/21/2023   Cervical spondylosis with myelopathy and radiculopathy   * No surgery found *   11/21/2023   PRE-OPERATIVE DIAGNOSIS:  PNEUMOPERITONEUM, PROBABLE PERFORATED GASTRIC ULCER   POST-OPERATIVE DIAGNOSIS:  PERFORATED GASTRIC ULCER WITH ABSCESSES   PROCEDURE:  DIAGNOSTIC LAPAROSCOPY DRAINAGE OF ABSCESSES OMENTAL GRAHAM PATCH OF ULCER   SURGEON:  Elspeth KYM Schultze, MD  OR FINDINGS:  Moderate phlegmonous contamination with brackish fluid upper abdomen & RUQ retrohepatic space.  Abscesses.  Large volume of abdominal fat and omentum trying to wall off perforation only partially.   Perforated ulcer at the prepyloric region.  4 mm punched-out defect with some bilious contamination.  Omental patch done.  Washout and drain placed given moderate peritonitis & contamination   CASE DATA: Type of patient?: LDOW CASE (Surgical Hospitalist [facility] Inpatient) Status of Case? URGENT Add On Infection Present At Time Of Surgery (PATOS)?  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   Surgeon:Henry  A.Pool, M.D.  Assessment [facility] Stay = 4 days)      Postoperative gas and fluid collections in the setting of omental patching for perforated ulcer    Plan: POD#19 status post emergent laparoscopic omental Graham patch for perforated prepyloric  ulcer & significant peritonitis.  Return for leukocytosis and persistent purulent drainage.  Patient with persistent purulent drainage and some abdominal discomfort and decreased appetite.  White count a little better on cefepime  that was switched yesterday but still with purulent drainage.   Given the persistent leukocytosis on antibiotics with bowel rest and persistent collections and purulent drainage, I agree with having interventional radiology  aspirate the RLQ abdominal wall fluid collection that is draining pus near the old drain site to rule out atypical organism.    place pigtail drain in the chronic gas pocket near the prepyloric repair to rule out a delayed leak.    They are reviewing the films.    I feel rather strongly that we should do this since he always has an underwhelming exam but yet had pretty significant peritonitis on the initial surgery.  Discussed this with the patient and his wife and their children on the phone.  I think the wife was concerned about going on with this at first.  However the patient wishes to be aggressive and proceed.  Hopefully interventional radiology will proceed with my orders.  Status post neck surgery.  Right anterior neck incision closed with minimal ecchymosis and healing well.  I removed the Steri-Strips that were falling off  Main complaint is leg pain.  Okay to give extra tramadol  if needed from my standpoint.  -monitor electrolytes & replace as needed  Keep K>4, Mg>2, Phos>3  -  VTE prophylaxis- SCDs.  Anticoagulation prophyllaxis SQ as appropriate  -mobilize as tolerated to help recovery.  Enlist therapies in moderate/high risk patients as appropriate  I updated the patient's  status to the patient and family.  Also discussed with nurse Vina Recommendations were made.  Questions were answered.  They expressed understanding & appreciation.  -Disposition: TBD      I reviewed nursing notes, hospitalist notes, last 24 h vitals and pain scores, last 48 h intake and output, last 24 h labs and trends, and last 24 h imaging results.  I have reviewed this patient's available data, including medical history, events of note, test results, etc as part of my evaluation.   A significant portion of that time was spent in counseling. Care during the described time interval was provided by me.  This care required moderate level of medical decision making.  12/10/2023    Subjective: (Chief complaint)  Patient complaining of leg pain wanting more tramadol . Not as sore in abdomen Not much of an appetite but not nauseated.  Still draining from the side.  Wife in room.  Has children on speaker phone wanting to listen in.  Objective:  Vital signs:  Vitals:   12/09/23 1003 12/09/23 1214 12/09/23 1944 12/10/23 0627  BP: 137/67 (!) 152/69 (!) 164/64 (!) 148/64  Pulse:  77 77 66  Resp:  16  18  Temp:  98.1 F (36.7 C) 97.9 F (36.6 C) (!) 97.5 F (36.4 C)  TempSrc:  Oral Oral   SpO2:  99% 91% 99%  Weight:      Height:        Last BM Date : 12/07/23  Intake/Output   Yesterday:  11/27 0701 - 11/28 0700 In: 1045.4 [IV Piggyback:1045.4] Out: -  This shift:  No intake/output data recorded.  Bowel function:  Flatus: YES  BM:  YES  Drain: (No drain)   Physical Exam:  General: Pt awake/alert in no acute distress Eyes: PERRL, normal EOM.  Sclera clear.  No icterus Neuro: CN II-XII intact w/o focal sensory/motor deficits. Lymph: No head/neck/groin lymphadenopathy Psych:  No delerium/psychosis/paranoia.  Oriented x 4 HENT: Normocephalic, Mucus membranes moist.  No thrush Neck: Supple, No tracheal deviation.  No obvious thyromegaly.  Steri-Strips falling  off right anterior neck incision.  Removed.  Incision closed with minimal ecchymosis and normal healing ridge Chest: No pain to chest wall compression.  Good respiratory excursion.  No audible wheezing CV:  Pulses intact.  Regular rhythm.  No major extremity edema MS: Normal AROM mjr joints.  No obvious deformity  Abdomen: Soft.  Moderately distended.  Mild discomfort around right lower quadrant drain site.  Some scant seropurulent drainage at the site.  Mild discomfort in upper abdomen but no severe guarding..  No evidence of peritonitis.  No incarcerated hernias.  Ext:   No deformity.  No mjr edema.  No cyanosis Skin: No petechiae / purpurea.  No major sores.  Warm and dry    Results:   Cultures: Recent Results (from the past 720 hours)  Urine Culture     Status: Abnormal   Collection Time: 11/19/23  6:00 PM   Specimen: Urine, Clean Catch  Result Value Ref Range Status   Specimen Description   Final    URINE, CLEAN CATCH Performed at [facility], [address] [place], [place] [address]    Special Requests   Final    NONE Performed at [facility], [address]  [place], [place] [address]    Culture (A)  Final    <10,000 COLONIES/mL INSIGNIFICANT GROWTH Performed at [facility], [address], [place], [place] [address]    Report Status 11/20/2023 FINAL  Final  Blood culture (routine x 2)     Status: None   Collection Time: 11/19/23  7:35 PM   Specimen: BLOOD  Result Value Ref Range Status   Specimen Description   Final    BLOOD RIGHT ANTECUBITAL Performed at [facility], [address] [place], [place] [address]    Special Requests   Final    BOTTLES DRAWN AEROBIC AND ANAEROBIC Blood Culture adequate volume Performed at [facility], [address] [place], [place] [address]    Culture   Final    NO GROWTH 5 DAYS Performed at [facility], [address], [place], [place] [address]    Report Status  11/24/2023 FINAL  Final  Blood culture (routine x 2)     Status: None   Collection Time: 11/19/23  7:45 PM   Specimen: BLOOD  Result Value Ref Range Status   Specimen Description   Final    BLOOD LEFT ANTECUBITAL Performed at [facility], [address] [place], [place] [address]    Special Requests   Final    BOTTLES DRAWN AEROBIC AND ANAEROBIC Blood Culture adequate volume Performed at [facility], [address] [place], [place] [address]    Culture   Final    NO GROWTH 5 DAYS Performed at [facility], [address], [place], [place] [address]    Report Status 11/24/2023 FINAL  Final  Surgical pcr screen     Status: None   Collection Time: 11/30/23  6:48 AM   Specimen: Nasal Mucosa; Nasal Swab  Result Value Ref Range Status   MRSA, PCR NEGATIVE NEGATIVE Final   Staphylococcus aureus NEGATIVE NEGATIVE Final    Comment: (NOTE) The Xpert SA Assay (FDA approved for NASAL specimens in patients 8 years of age and older), is one component of a comprehensive surveillance program. It is not intended to diagnose infection nor to guide or monitor treatment. Performed at [facility], [address], [place], [place] [address]     Labs: Results for orders placed or performed during the hospital encounter of 12/05/23 (from the past 48 hours)  CBC     Status: Abnormal   Collection Time: 12/09/23  4:35 AM  Result Value Ref Range   WBC 17.5 (H) 4.0 - 10.5 K/uL   RBC 3.10 (L) 4.22 - 5.81 MIL/uL   Hemoglobin 9.5 (L) 13.0 - 17.0 g/dL   HCT 69.3 (L) 60.9 - 47.9 %   MCV 98.7 80.0 - 100.0 fL   MCH 30.6 26.0 - 34.0 pg   MCHC 31.0 30.0 - 36.0 g/dL   RDW 85.8 88.4 - 84.4 %   Platelets 495 (H) 150 - 400 K/uL   nRBC 0.0 0.0 - 0.2 %    Comment: Performed at [facility], [address] [place], [place] [address]  Basic metabolic panel with GFR     Status: Abnormal   Collection Time: 12/09/23  4:35 AM  Result Value Ref Range    Sodium 137 135 - 145 mmol/L   Potassium 4.0 3.5 - 5.1 mmol/L   Chloride 102 98 - 111 mmol/L   CO2 26 22 - 32 mmol/L  Glucose, Bld 115 (H) 70 - 99 mg/dL    Comment: Glucose reference range applies only to samples taken after fasting for at least 8 hours.   BUN 11 8 - 23 mg/dL   Creatinine, Ser 9.10 0.61 - 1.24 mg/dL   Calcium 8.5 (L) 8.9 - 10.3 mg/dL   GFR, Estimated >39 >39 mL/min    Comment: (NOTE) Calculated using the CKD-EPI Creatinine Equation (2021)    Anion gap 10 5 - 15    Comment: Performed at [facility], [address], [place], [place] [address]  CBC with Differential/Platelet     Status: Abnormal   Collection Time: 12/09/23  1:25 PM  Result Value Ref Range   WBC 17.5 (H) 4.0 - 10.5 K/uL   RBC 2.94 (L) 4.22 - 5.81 MIL/uL   Hemoglobin 9.2 (L) 13.0 - 17.0 g/dL   HCT 71.1 (L) 60.9 - 47.9 %   MCV 98.0 80.0 - 100.0 fL   MCH 31.3 26.0 - 34.0 pg   MCHC 31.9 30.0 - 36.0 g/dL   RDW 86.0 88.4 - 84.4 %   Platelets 417 (H) 150 - 400 K/uL   nRBC 0.1 0.0 - 0.2 %   Neutrophils Relative % 74 %   Neutro Abs 13.0 (H) 1.7 - 7.7 K/uL   Lymphocytes Relative 9 %   Lymphs Abs 1.6 0.7 - 4.0 K/uL   Monocytes Relative 8 %   Monocytes Absolute 1.4 (H) 0.1 - 1.0 K/uL   Eosinophils Relative 1 %   Eosinophils Absolute 0.2 0.0 - 0.5 K/uL   Basophils Relative 0 %   Basophils Absolute 0.0 0.0 - 0.1 K/uL   Metamyelocytes Relative 1 %   Myelocytes 6 %   Promyelocytes Relative 1 %   Abs Immature Granulocytes 1.40 (H) 0.00 - 0.07 K/uL   Polychromasia PRESENT     Comment: Performed at [facility], [address] [place], [place] [address]    Imaging / Studies: CT ABDOMEN PELVIS W CONTRAST Result Date: 12/08/2023 CLINICAL DATA:  Postoperative abdominal pain EXAM: CT ABDOMEN AND PELVIS WITH CONTRAST TECHNIQUE: Multidetector CT imaging of the abdomen and pelvis was performed using the standard protocol following bolus administration of intravenous  contrast. RADIATION DOSE REDUCTION: This exam was performed according to the departmental dose-optimization program which includes automated exposure control, adjustment of the mA and/or kV according to patient size and/or use of iterative reconstruction technique. CONTRAST:  OMNIPAQUE  IOHEXOL  300 MG/ML  SOLN COMPARISON:  12/06/2023 FINDINGS: Lower chest: No acute pleural or parenchymal lung disease. Hepatobiliary: Hepatic steatosis. No acute liver abnormality. The gallbladder is unremarkable. Pancreas: Unremarkable. No pancreatic ductal dilatation or surrounding inflammatory changes. Spleen: Normal in size without focal abnormality. Adrenals/Urinary Tract: Adrenal glands are unremarkable. Kidneys are normal, without renal calculi, focal lesion, or hydronephrosis. Bladder is unremarkable. Stomach/Bowel: No bowel obstruction or ileus. Normal appendix right lower quadrant. Diverticulosis of the descending and sigmoid colon without evidence of acute diverticulitis. Loculated gas and fluid are seen between the gastric antrum and liver capsule, measuring up to 3.9 x 3.5 cm, again tracking along the lesser curvature of the stomach. This has increased in size since the 11/26/2023 and 12/06/2023 exams. Vascular/Lymphatic: Stable aortic atherosclerosis. Borderline enlarged lymphadenopathy most pronounced within the porta hepatis and celiac regions, measuring up to 10 mm in short axis. This is likely reactive. Reproductive: Evaluation of the prostate is limited due to streak artifact from bilateral hip arthroplasties. Other: Stable intraperitoneal fluid collection along the right  anterolateral abdominal wall, measuring 4.1 x 1.9 cm. Punctate foci of free gas are again seen in the right upper quadrant compatible with history of perforated ulcer and prior laparoscopy. No abdominal wall hernia. Musculoskeletal: Bilateral hip arthroplasties. No acute or destructive bony abnormalities. IMPRESSION: 1. Increase in size of the  loculated gas and fluid collection interposed between the gastric antrum and liver, consistent with known history of perforated gastric ulcer. 2. Stable intraperitoneal fluid collection along the right anterolateral abdominal wall, favor postoperative seroma. Sterility of fluid cannot be assessed by imaging alone. 3. Distal colonic diverticulosis without diverticulitis. 4. Reactive mesenteric lymphadenopathy. 5. Hepatic steatosis. 6.  Aortic Atherosclerosis (ICD10-I70.0). Electronically Signed   By: Ozell Daring M.D.   On: 12/08/2023 22:11   VAS US  LOWER EXTREMITY VENOUS (DVT) Result Date: 12/08/2023  Lower Venous DVT Study Patient Name:  Theodore Stevenson  Date of Exam:   12/08/2023 Medical Rec #: [id number]          Accession #:    [id number] Date of Birth: 17-Dec-1955           Patient Gender: M Patient Age:   68 years Exam Location:  [facility] Procedure:      VAS US  LOWER EXTREMITY VENOUS (DVT) Referring Phys: DELON YATES --------------------------------------------------------------------------------  Indications: Left calf pain.  Risk Factors: Recent surgeries - Cervical discectomy w/ interbody fusion on 11/30/2023 & laproscopic Arlyss patch of perforated ulcer on 11/21/2023. Comparison Study: No previous exams Performing Technologist: Jody Hill RVT, RDMS  Examination Guidelines: A complete evaluation includes B-mode imaging, spectral Doppler, color Doppler, and power Doppler as needed of all accessible portions of each vessel. Bilateral testing is considered an integral part of a complete examination. Limited examinations for reoccurring indications may be performed as noted. The reflux portion of the exam is performed with the patient in reverse Trendelenburg.  +-----+---------------+---------+-----------+----------+--------------+ RIGHTCompressibilityPhasicitySpontaneityPropertiesThrombus Aging +-----+---------------+---------+-----------+----------+--------------+ CFV  Full            Yes      Yes                                 +-----+---------------+---------+-----------+----------+--------------+   +---------+---------------+---------+-----------+----------+--------------+ LEFT     CompressibilityPhasicitySpontaneityPropertiesThrombus Aging +---------+---------------+---------+-----------+----------+--------------+ CFV      Full           Yes      Yes                                 +---------+---------------+---------+-----------+----------+--------------+ SFJ      Full                                                        +---------+---------------+---------+-----------+----------+--------------+ FV Prox  Full           Yes      Yes                                 +---------+---------------+---------+-----------+----------+--------------+ FV Mid   Full           Yes      Yes                                 +---------+---------------+---------+-----------+----------+--------------+  FV DistalFull           Yes      Yes                                 +---------+---------------+---------+-----------+----------+--------------+ PFV      Full                                                        +---------+---------------+---------+-----------+----------+--------------+ POP      Full           Yes      Yes                                 +---------+---------------+---------+-----------+----------+--------------+ PTV      Full                                                        +---------+---------------+---------+-----------+----------+--------------+ PERO     Full                                                        +---------+---------------+---------+-----------+----------+--------------+    Summary: RIGHT: - No evidence of common femoral vein obstruction.   LEFT: - There is no evidence of deep vein thrombosis in the lower extremity.  - No cystic structure found in the popliteal fossa.  *See table(s) above for  measurements and observations. Electronically signed by Lonni Gaskins MD on 12/08/2023 at 11:00:04 AM.    Final     Medications / Allergies: per chart  Antibiotics: Anti-infectives (From admission, onward)    Start     Dose/Rate Route Frequency Ordered Stop   12/09/23 1200  metroNIDAZOLE  (FLAGYL ) IVPB 500 mg        500 mg 100 mL/hr over 60 Minutes Intravenous Every 12 hours 12/09/23 1030     12/06/23 1700  vancomycin  (VANCOREADY) IVPB 750 mg/150 mL  Status:  Discontinued        750 mg 150 mL/hr over 60 Minutes Intravenous Every 12 hours 12/06/23 0443 12/09/23 1030   12/06/23 1000  ceFEPIme  (MAXIPIME ) 2 g in sodium chloride  0.9 % 100 mL IVPB        2 g 200 mL/hr over 30 Minutes Intravenous Every 8 hours 12/06/23 0438     12/06/23 0445  vancomycin  (VANCOREADY) IVPB 1500 mg/300 mL        1,500 mg 150 mL/hr over 120 Minutes Intravenous  Once 12/06/23 0438 12/06/23 0800   12/06/23 0130  ceFEPIme  (MAXIPIME ) 2 g in sodium chloride  0.9 % 100 mL IVPB        2 g 200 mL/hr over 30 Minutes Intravenous  Once 12/06/23 0124 12/06/23 0213   12/06/23 0130  metroNIDAZOLE  (FLAGYL ) IVPB 500 mg        500 mg 100 mL/hr over 60 Minutes Intravenous  Once 12/06/23 0124 12/06/23 0302  Note: Portions of this report may have been transcribed using voice recognition software. Every effort was made to ensure accuracy; however, inadvertent computerized transcription errors may be present.   Any transcriptional errors that result from this process are unintentional.    Elspeth KYM Schultze, MD, FACS, MASCRS Esophageal, Gastrointestinal & Colorectal Surgery Robotic and Minimally Invasive Surgery  [facility] [address], [address] [place], [place] [address] [phone] Fax [phone] Main  CONTACT INFORMATION: Weekday (9AM-5PM): Call [facility] main office at [phone] Weeknight (5PM-9AM) or Weekend/Holiday: Check EPIC Web Links tab &  use AMION (password  [email or web address]) for General Surgery [facility] coverage  Please, DO NOT use SecureChat  (it is not reliable communication to reach operating surgeons & will lead to a delay in care).   Epic staff messaging available for outpatient concerns needing 1-2 business day response.      12/10/2023  7:45 AM

## 2023-12-10 NOTE — Procedures (Signed)
  Procedure:  CT aspiration abdomen 4ml purulent Preprocedure diagnosis: The primary encounter diagnosis was Altered mental status, unspecified altered mental status type. A diagnosis of Fever in adult was also pertinent to this visit. Postprocedure diagnosis: same EBL:    minimal Complications:   none immediate  See full dictation in [employer].  CHARM Toribio Faes MD Main # [phone] Pager  [phone] Mobile [phone]

## 2023-12-10 NOTE — Progress Notes (Signed)
 PT Cancellation Note  Patient Details Name: Theodore Stevenson MRN: [id number] DOB: 1955-11-08   Cancelled Treatment:    Reason Eval/Treat Not Completed: Patient at procedure or test/unavailable (pt out of room for a procedure. Will follow.)   Sylvan Delon Copp PT 12/10/2023  Acute Rehabilitation Services  Office [phone]

## 2023-12-10 NOTE — Progress Notes (Addendum)
 PROGRESS NOTE    [name]  FMW:[id number] DOB: [birth year] DOA: 12/05/2023 PCP: Patient, No Pcp Per     Brief Narrative:  Theodore Stevenson is a 68yo with h/o HTN, gastric ulcer s/p Arlyss patch on 11/9 with JP drain, and chronic back pain due to spinal stenosis who presented on 11/23 for worsening abdominal wound drainage.  CT with postoperative changes and fluid collection 4.4 cm.  Surgery consulted, cleared for diet, appears to be improving without need for intervention.  Due to continued elevation in WBC, he underwent repeat CT 11/27 which showed increase in size of fluid collection. IR consulted for consideration of aspiration of fluid.  New events last 24 hours / Subjective: Feeling frustrated this morning. Also with leg pain. Family would like to discuss with IR today prior to undergoing drain placement - message sent to IR team.   Assessment & Plan:  Principal Problem:   Post-operative wound abscess Active Problems:   Hypertension   Chronic left-sided low back pain with left-sided sciatica   Perforated prepyloric ulcer s/p lap omental Arlyss patch/washout 11/21/2023   Cervical spondylosis with myelopathy and radiculopathy  Post-operative wound abscess Perforated prepyloric ulcer, s/p lap omental Arlyss patch and washout 11/9 - General surgery following - Repeat CT 11/27 showed increase in size of fluid collection - Continue cefepime , vanco --> change to cefepime , flagyl   - IR consulted for aspiration of RUQ abdominal wall fluid and drain placement of gas pocket near prepyloric repair   HTN - Lisinopril , hydrochlorothiazide   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 - S/p uncomplicated C3-4 anterior cervical tenectomy and fusion on 11/18 - PT OT recommending HH therapy    DVT prophylaxis:  SCDs Start: 12/06/23 0427  Code Status: Full Family Communication: Wife and daughters  bedside Disposition Plan: Home with home health Status is: Inpatient Remains inpatient appropriate because: IV antibiotics     Antimicrobials:  Anti-infectives (From admission, onward)    Start     Dose/Rate Route Frequency Ordered Stop   12/09/23 1200  metroNIDAZOLE  (FLAGYL ) IVPB 500 mg        500 mg 100 mL/hr over 60 Minutes Intravenous Every 12 hours 12/09/23 1030     12/06/23 1700  vancomycin  (VANCOREADY) IVPB 750 mg/150 mL  Status:  Discontinued        750 mg 150 mL/hr over 60 Minutes Intravenous Every 12 hours 12/06/23 0443 12/09/23 1030   12/06/23 1000  ceFEPIme  (MAXIPIME ) 2 g in sodium chloride  0.9 % 100 mL IVPB        2 g 200 mL/hr over 30 Minutes Intravenous Every 8 hours 12/06/23 0438     12/06/23 0445  vancomycin  (VANCOREADY) IVPB 1500 mg/300 mL        1,500 mg 150 mL/hr over 120 Minutes Intravenous  Once 12/06/23 0438 12/06/23 0800   12/06/23 0130  ceFEPIme  (MAXIPIME ) 2 g in sodium chloride  0.9 % 100 mL IVPB        2 g 200 mL/hr over 30 Minutes Intravenous  Once 12/06/23 0124 12/06/23 0213   12/06/23 0130  metroNIDAZOLE  (FLAGYL ) IVPB 500 mg        500 mg 100 mL/hr over 60 Minutes Intravenous  Once 12/06/23 0124 12/06/23 0302        Objective: Vitals:   12/09/23 1003 12/09/23 1214 12/09/23 1944 12/10/23 0627  BP: 137/67 (!) 152/69 (!) 164/64 (!) 148/64  Pulse:  77 77 66  Resp:  16  18  Temp:  98.1 F (36.7 C) 97.9 F (36.6 C) (!) 97.5 F (36.4 C)  TempSrc:  Oral Oral   SpO2:  99% 91% 99%  Weight:      Height:        Intake/Output Summary (Last 24 hours) at 12/10/2023 0930 Last data filed at 12/09/2023 1500 Gross per 24 hour  Intake 1045.37 ml  Output --  Net 1045.37 ml   Filed Weights   12/05/23 2320 12/06/23 0629  Weight: 79.4 kg 81.9 kg    Examination:  General exam: Appears calm and comfortable    Data Reviewed: I have personally reviewed following labs and imaging studies  CBC: Recent Labs  Lab 12/05/23 2332 12/07/23 0423  12/08/23 0446 12/09/23 0435 12/09/23 1325 12/10/23 0806  WBC 15.8* 15.8* 16.1* 17.5* 17.5* 15.5*  NEUTROABS 10.0* 9.4* 9.0*  --  13.0*  --   HGB 10.0* 9.1* 9.9* 9.5* 9.2* 10.0*  HCT 31.6* 29.2* 32.0* 30.6* 28.8* 31.2*  MCV 97.2 99.3 99.7 98.7 98.0 98.7  PLT 682* 604* 556* 495* 417* 408*   Basic Metabolic Panel: Recent Labs  Lab 12/05/23 2332 12/07/23 0423 12/09/23 0435  NA 133* 135 137  K 3.8 3.9 4.0  CL 94* 100 102  CO2 30 27 26   GLUCOSE 112* 105* 115*  BUN 9 12 11   CREATININE 0.96 0.92 0.89  CALCIUM 9.2 8.5* 8.5*   GFR: Estimated Creatinine Clearance: 76.1 mL/min (by C-G formula based on SCr of 0.89 mg/dL). Liver Function Tests: Recent Labs  Lab 12/05/23 2332 12/07/23 0423  AST 66* 37  ALT 144* 88*  ALKPHOS 308* 254*  BILITOT 0.4 0.2  PROT 6.3* 5.7*  ALBUMIN  2.8* 2.6*   Recent Labs  Lab 12/05/23 2332  LIPASE 42   Recent Labs  Lab 12/05/23 2332  AMMONIA 23   Coagulation Profile: Recent Labs  Lab 12/05/23 2332  INR 1.1   Cardiac Enzymes: No results for input(s): CKTOTAL, CKMB, CKMBINDEX, TROPONINI in the last 168 hours. BNP (last 3 results) No results for input(s): PROBNP in the last 8760 hours. HbA1C: No results for input(s): HGBA1C in the last 72 hours. CBG: No results for input(s): GLUCAP in the last 168 hours. Lipid Profile: No results for input(s): CHOL, HDL, LDLCALC, TRIG, CHOLHDL, LDLDIRECT in the last 72 hours. Thyroid Function Tests: No results for input(s): TSH, T4TOTAL, FREET4, T3FREE, THYROIDAB in the last 72 hours. Anemia Panel: No results for input(s): VITAMINB12, FOLATE, FERRITIN, TIBC, IRON, RETICCTPCT in the last 72 hours. Sepsis Labs: Recent Labs  Lab 12/06/23 0143 12/06/23 0328  LATICACIDVEN 0.9 0.8    No results found for this or any previous visit (from the past 240 hours).     Radiology Studies: CT ABDOMEN PELVIS W CONTRAST Result Date: 12/08/2023 CLINICAL DATA:   Postoperative abdominal pain EXAM: CT ABDOMEN AND PELVIS WITH CONTRAST TECHNIQUE: Multidetector CT imaging of the abdomen and pelvis was performed using the standard protocol following bolus administration of intravenous contrast. RADIATION DOSE REDUCTION: This exam was performed according to the departmental dose-optimization program which includes automated exposure control, adjustment of the mA and/or kV according to patient size and/or use of iterative reconstruction technique. CONTRAST:  OMNIPAQUE  IOHEXOL  300 MG/ML  SOLN COMPARISON:  12/06/2023 FINDINGS: Lower chest: No acute pleural or parenchymal lung disease. Hepatobiliary: Hepatic steatosis. No acute liver abnormality. The gallbladder is unremarkable. Pancreas: Unremarkable. No pancreatic ductal dilatation or surrounding inflammatory changes. Spleen: Normal in size without focal abnormality. Adrenals/Urinary Tract: Adrenal glands are  unremarkable. Kidneys are normal, without renal calculi, focal lesion, or hydronephrosis. Bladder is unremarkable. Stomach/Bowel: No bowel obstruction or ileus. Normal appendix right lower quadrant. Diverticulosis of the descending and sigmoid colon without evidence of acute diverticulitis. Loculated gas and fluid are seen between the gastric antrum and liver capsule, measuring up to 3.9 x 3.5 cm, again tracking along the lesser curvature of the stomach. This has increased in size since the 11/26/2023 and 12/06/2023 exams. Vascular/Lymphatic: Stable aortic atherosclerosis. Borderline enlarged lymphadenopathy most pronounced within the porta hepatis and celiac regions, measuring up to 10 mm in short axis. This is likely reactive. Reproductive: Evaluation of the prostate is limited due to streak artifact from bilateral hip arthroplasties. Other: Stable intraperitoneal fluid collection along the right anterolateral abdominal wall, measuring 4.1 x 1.9 cm. Punctate foci of free gas are again seen in the right upper quadrant  compatible with history of perforated ulcer and prior laparoscopy. No abdominal wall hernia. Musculoskeletal: Bilateral hip arthroplasties. No acute or destructive bony abnormalities. IMPRESSION: 1. Increase in size of the loculated gas and fluid collection interposed between the gastric antrum and liver, consistent with known history of perforated gastric ulcer. 2. Stable intraperitoneal fluid collection along the right anterolateral abdominal wall, favor postoperative seroma. Sterility of fluid cannot be assessed by imaging alone. 3. Distal colonic diverticulosis without diverticulitis. 4. Reactive mesenteric lymphadenopathy. 5. Hepatic steatosis. 6.  Aortic Atherosclerosis (ICD10-I70.0). Electronically Signed   By: Ozell Daring M.D.   On: 12/08/2023 22:11   VAS US  LOWER EXTREMITY VENOUS (DVT) Result Date: 12/08/2023  Lower Venous DVT Study Patient Name:  CALLAN NORDEN  Date of Exam:   12/08/2023 Medical Rec #: [id number]          Accession #:    [id number] Date of Birth: 1955-03-05           Patient Gender: M Patient Age:   6 years Exam Location:  [facility] Procedure:      VAS US  LOWER EXTREMITY VENOUS (DVT) Referring Phys: DELON YATES --------------------------------------------------------------------------------  Indications: Left calf pain.  Risk Factors: Recent surgeries - Cervical discectomy w/ interbody fusion on 11/30/2023 & laproscopic Arlyss patch of perforated ulcer on 11/21/2023. Comparison Study: No previous exams Performing Technologist: Jody Hill RVT, RDMS  Examination Guidelines: A complete evaluation includes B-mode imaging, spectral Doppler, color Doppler, and power Doppler as needed of all accessible portions of each vessel. Bilateral testing is considered an integral part of a complete examination. Limited examinations for reoccurring indications may be performed as noted. The reflux portion of the exam is performed with the patient in reverse Trendelenburg.   +-----+---------------+---------+-----------+----------+--------------+ RIGHTCompressibilityPhasicitySpontaneityPropertiesThrombus Aging +-----+---------------+---------+-----------+----------+--------------+ CFV  Full           Yes      Yes                                 +-----+---------------+---------+-----------+----------+--------------+   +---------+---------------+---------+-----------+----------+--------------+ LEFT     CompressibilityPhasicitySpontaneityPropertiesThrombus Aging +---------+---------------+---------+-----------+----------+--------------+ CFV      Full           Yes      Yes                                 +---------+---------------+---------+-----------+----------+--------------+ SFJ      Full                                                        +---------+---------------+---------+-----------+----------+--------------+  FV Prox  Full           Yes      Yes                                 +---------+---------------+---------+-----------+----------+--------------+ FV Mid   Full           Yes      Yes                                 +---------+---------------+---------+-----------+----------+--------------+ FV DistalFull           Yes      Yes                                 +---------+---------------+---------+-----------+----------+--------------+ PFV      Full                                                        +---------+---------------+---------+-----------+----------+--------------+ POP      Full           Yes      Yes                                 +---------+---------------+---------+-----------+----------+--------------+ PTV      Full                                                        +---------+---------------+---------+-----------+----------+--------------+ PERO     Full                                                         +---------+---------------+---------+-----------+----------+--------------+    Summary: RIGHT: - No evidence of common femoral vein obstruction.   LEFT: - There is no evidence of deep vein thrombosis in the lower extremity.  - No cystic structure found in the popliteal fossa.  *See table(s) above for measurements and observations. Electronically signed by Lonni Gaskins MD on 12/08/2023 at 11:00:04 AM.    Final       Scheduled Meds:  acetaminophen   1,000 mg Oral Q6H   hydrochlorothiazide   25 mg Oral Daily   lisinopril   20 mg Oral Daily   pantoprazole   40 mg Oral BID   Continuous Infusions:  ceFEPime  (MAXIPIME ) IV 2 g (12/10/23 0200)   metronidazole  500 mg (12/09/23 2359)     LOS: 4 days   Time spent: 35 minutes   Delon Hoe, DO [facility] 12/10/2023, 9:30 AM   Available via Epic secure chat 7am-7pm After these hours, please refer to coverage provider listed on [email or web address]

## 2023-12-11 DIAGNOSIS — T8149XA Infection following a procedure, other surgical site, initial encounter: Secondary | ICD-10-CM | POA: Diagnosis not present

## 2023-12-11 LAB — CBC
HCT: 29.7 % — ABNORMAL LOW (ref 39.0–52.0)
Hemoglobin: 9.3 g/dL — ABNORMAL LOW (ref 13.0–17.0)
MCH: 30.9 pg (ref 26.0–34.0)
MCHC: 31.3 g/dL (ref 30.0–36.0)
MCV: 98.7 fL (ref 80.0–100.0)
Platelets: 384 K/uL (ref 150–400)
RBC: 3.01 MIL/uL — ABNORMAL LOW (ref 4.22–5.81)
RDW: 13.9 % (ref 11.5–15.5)
WBC: 13.8 K/uL — ABNORMAL HIGH (ref 4.0–10.5)
nRBC: 0 % (ref 0.0–0.2)

## 2023-12-11 MED ORDER — ONDANSETRON HCL 4 MG PO TABS: 4.0000 mg | ORAL_TABLET | Freq: Four times a day (QID) | ORAL | 0 refills | Status: AC | PRN

## 2023-12-11 MED ORDER — METRONIDAZOLE 500 MG PO TABS: 500.0000 mg | ORAL_TABLET | Freq: Two times a day (BID) | ORAL | 0 refills | Status: AC

## 2023-12-11 MED ORDER — CEPHALEXIN 500 MG PO CAPS: 500.0000 mg | ORAL_CAPSULE | Freq: Three times a day (TID) | ORAL | 0 refills | Status: AC

## 2023-12-11 MED ORDER — SUCRALFATE 1 G PO TABS: 1.0000 g | ORAL_TABLET | Freq: Three times a day (TID) | ORAL | 0 refills | Status: AC | PRN

## 2023-12-11 NOTE — Progress Notes (Signed)
 Subjective/Chief Complaint: No complaints this morning   Objective: Vital signs in last 24 hours: Temp:  [97.7 F (36.5 C)-98.3 F (36.8 C)] 98.3 F (36.8 C) (11/29 0550) Pulse Rate:  [67-72] 67 (11/29 0550) Resp:  [16-18] 17 (11/29 0550) BP: (135-164)/(63-75) 135/65 (11/29 0550) SpO2:  [97 %-100 %] 100 % (11/29 0550) Last BM Date : 12/07/23  Intake/Output from previous day: No intake/output data recorded. Intake/Output this shift: No intake/output data recorded.  Exam: Awake and alert Comfortable Abdomen soft, non-tender  Lab Results:  Recent Labs    12/10/23 0806 12/11/23 0357  WBC 15.5* 13.8*  HGB 10.0* 9.3*  HCT 31.2* 29.7*  PLT 408* 384   BMET Recent Labs    12/09/23 0435  NA 137  K 4.0  CL 102  CO2 26  GLUCOSE 115*  BUN 11  CREATININE 0.89  CALCIUM 8.5*   PT/INR No results for input(s): LABPROT, INR in the last 72 hours. ABG No results for input(s): PHART, HCO3 in the last 72 hours.  Invalid input(s): PCO2, PO2  Studies/Results: CT GUIDED VISCERAL FLUID DRAIN BY PERC CATH Result Date: 12/10/2023 CLINICAL DATA:  History of Arlyss patch. Recent CT suggested increase in size of the loculated gas and fluid collection interposed between the gastric antrum and liver. Stable intraperitoneal fluid collection along the right anterolateral abdominal wall EXAM: CT GUIDED ABDOMINAL ASPIRATION ANESTHESIA/SEDATION: Intravenous Fentanyl  25mcg and Versed  1mg  were administered by RN during a total moderate (conscious) sedation time of 11 minutes; the patient's level of consciousness and physiological / cardiorespiratory status were monitored continuously by radiology RN under my direct supervision. PROCEDURE: The procedure, risks, benefits, and alternatives were explained to the patient. Questions regarding the procedure were encouraged and answered. The patient understands and consents to the procedure. select axial scans through the abdomen were  obtained. the right lateral abdominal collection was localized and an appropriate skin entry site determined and marked. The operative field was prepped with chlorhexidinein a sterile fashion, and a sterile drape was applied covering the operative field. A sterile gown and sterile gloves were used for the procedure. Local anesthesia was provided with 1% Lidocaine . Under CT fluoroscopic guidance, a 19 gauge percutaneous entry needle advanced into the collection. 4 mL of purulent fluid were aspirated, sent for Gram stain and culture. CT fluoro demonstrates resolution of the visible collection. Compared to the previous exam from 2 days ago, there has been interval decrease in size of the loculated subhepatic gas and fluid collections, too small for drain catheter placement. Drainage was deferred. RADIATION DOSE REDUCTION: This exam was performed according to the departmental dose-optimization program which includes automated exposure control, adjustment of the mA and/or kV according to patient size and/or use of iterative reconstruction technique. COMPLICATIONS: None immediate FINDINGS: Stable right lateral abdominal fluid collection. Aspiration returned formal parental material, sent for Gram stain and culture. Interval decrease in size of loculated gas collections in the 7 patent space near the region of the [place] patch. These are now too small to consider drain catheter placement. IMPRESSION: 1. Technically successful aspiration of 4 mL from the right lateral abdominal fluid collection. Electronically Signed   By: JONETTA Faes M.D.   On: 12/10/2023 16:06    Anti-infectives: Anti-infectives (From admission, onward)    Start     Dose/Rate Route Frequency Ordered Stop   12/09/23 1200  metroNIDAZOLE  (FLAGYL ) IVPB 500 mg        500 mg 100 mL/hr over 60 Minutes Intravenous Every 12 hours  12/09/23 1030     12/06/23 1700  vancomycin  (VANCOREADY) IVPB 750 mg/150 mL  Status:  Discontinued        750 mg 150 mL/hr  over 60 Minutes Intravenous Every 12 hours 12/06/23 0443 12/09/23 1030   12/06/23 1000  ceFEPIme  (MAXIPIME ) 2 g in sodium chloride  0.9 % 100 mL IVPB        2 g 200 mL/hr over 30 Minutes Intravenous Every 8 hours 12/06/23 0438     12/06/23 0445  vancomycin  (VANCOREADY) IVPB 1500 mg/300 mL        1,500 mg 150 mL/hr over 120 Minutes Intravenous  Once 12/06/23 0438 12/06/23 0800   12/06/23 0130  ceFEPIme  (MAXIPIME ) 2 g in sodium chloride  0.9 % 100 mL IVPB        2 g 200 mL/hr over 30 Minutes Intravenous  Once 12/06/23 0124 12/06/23 0213   12/06/23 0130  metroNIDAZOLE  (FLAGYL ) IVPB 500 mg        500 mg 100 mL/hr over 60 Minutes Intravenous  Once 12/06/23 0124 12/06/23 0302       Assessment/Plan: Lap graham patch repair of perf ulcer 11/9 - SG  -appreciate IR's help.  Cultures from aspiration pending -WBC down to 13.8 with antibiotic change.   -CT scan at procedure yesterday showed area next to the patch was improved so no aspiration or drain placed there -hopefully given trend, can discharge home on oral antibiotics soon   Vicenta Poli 12/11/2023

## 2023-12-11 NOTE — TOC Transition Note (Signed)
 Transition of Care [facility]) - Discharge Note   Patient Details  Name: Theodore Stevenson MRN: [id number] Date of Birth: [birth year]  Transition of Care [facility] CM/SW Contact:  Sonda Manuella Quill, RN Phone Number: 12/11/2023, 10:15 AM   Clinical Narrative:    D/C orders received; Burnard at [facility] notified; agency contact info placed in follow up provider section of d/c instructions; no IP CM needs.   Final next level of care: Home w Home Health Services Barriers to Discharge: No Barriers Identified   Patient Goals and CMS Choice Patient states their goals for this hospitalization and ongoing recovery are:: home with [facility] CMS Medicare.gov Compare Post Acute Care list provided to:: Patient Choice offered to / list presented to : Patient [place] ownership interest in [facility].provided to:: Patient    Discharge Placement                       Discharge Plan and Services Additional resources added to the After Visit Summary for   In-house Referral: NA Discharge Planning Services: CM Consult            DME Arranged: N/A DME Agency: NA       HH Arranged: PT, OT HH Agency: [facility] Date HH Agency Contacted: 12/11/23 Time HH Agency Contacted: 1013 Representative spoke with at [facility] Agency: Burnard  Social Drivers of Health (SDOH) Interventions SDOH Screenings   Food Insecurity: No Food Insecurity (12/06/2023)  Housing: Low Risk  (12/06/2023)  Transportation Needs: No Transportation Needs (12/06/2023)  Utilities: Not At Risk (12/06/2023)  Social Connections: Moderately Integrated (12/06/2023)  Tobacco Use: High Risk (11/30/2023)     Readmission Risk Interventions    12/07/2023   12:57 PM  Readmission Risk Prevention Plan  Transportation Screening Complete  PCP or Specialist Appt within 5-7 Days Complete  Home Care Screening Complete  Medication Review (RN CM) Complete

## 2023-12-11 NOTE — Plan of Care (Signed)

## 2023-12-11 NOTE — Discharge Summary (Addendum)
 Physician Discharge Summary  Theodore Stevenson FMW:[id number] DOB: 07-29-1955 DOA: 12/05/2023  PCP: Patient, No Pcp Per  Admit date: 12/05/2023 Discharge date: 12/11/2023  Admitted From: Home Disposition:  Home  Recommendations for Outpatient Follow-up:  Follow up with PCP, family knows to establish with new PCP as soon as able Follow up with general surgery Dr. Sheldon Follow up with repeat CBC Follow up on wound culture result from 11/29  Discharge Condition: Stable CODE STATUS: Full  Diet recommendation: Regular  Brief/Interim Summary: Theodore Stevenson is a 68yo with h/o HTN, gastric ulcer s/p Arlyss patch on 11/9 with JP drain, and chronic back pain due to spinal stenosis who presented on 11/23 for worsening abdominal wound drainage.  CT with postoperative changes and fluid collection 4.4 cm.  Surgery consulted, cleared for diet, appears to be improving without need for intervention.   Due to continued elevation in WBC, he underwent repeat CT 11/27 which showed increase in size of fluid collection. IR consulted for consideration of aspiration of fluid.  Discharge Diagnoses:   Principal Problem:   Post-operative wound abscess Active Problems:   Hypertension   Chronic left-sided low back pain with left-sided sciatica   Perforated prepyloric ulcer s/p lap omental Arlyss patch/washout 11/21/2023   Cervical spondylosis with myelopathy and radiculopathy   Post-operative wound abscess Perforated prepyloric ulcer, s/p lap omental Graham patch and washout 11/9 - General surgery following - Repeat CT 11/27 showed increase in size of fluid collection - Cefepime , vanco --> change to cefepime , flagyl  --> keflex and flagyl  for discharge  - IR consulted for aspiration of RUQ abdominal wall fluid and drain placement of gas pocket near prepyloric repair. Fluid aspirated 11/28. 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 - S/p uncomplicated C3-4 anterior cervical tenectomy and fusion on 11/18 - PT OT recommending HH therapy   Discharge Instructions  Discharge Instructions     Call MD for:  difficulty breathing, headache or visual disturbances   Complete by: As directed    Call MD for:  extreme fatigue   Complete by: As directed    Call MD for:  hives   Complete by: As directed    Call MD for:  persistant dizziness or light-headedness   Complete by: As directed    Call MD for:  persistant nausea and vomiting   Complete by: As directed    Call MD for:  redness, tenderness, or signs of infection (pain, swelling, redness, odor or green/yellow discharge around incision site)   Complete by: As directed    Call MD for:  severe uncontrolled pain   Complete by: As directed    Call MD for:  temperature >100.4   Complete by: As directed    Diet general   Complete by: As directed    Discharge instructions   Complete by: As directed    You were cared for by a hospitalist during your hospital stay. If you have any questions about your discharge medications or the care you received while you were in the hospital after you are discharged, you can call the unit and ask to speak with the hospitalist on call if the hospitalist that took care of you is not available. Once you are discharged, your primary care physician  will handle any further medical issues. Please note that NO REFILLS for any discharge medications will be authorized once you are discharged, as it is imperative that you return to your primary care physician (or establish a relationship with a primary care physician if you do not have one) for your aftercare needs so that they can reassess your need for medications and monitor your lab values.   Increase activity  slowly   Complete by: As directed    No wound care   Complete by: As directed       Allergies as of 12/11/2023       Reactions   Codeine Nausea And Vomiting   Nsaids Other (See Comments)   H/o ulcers & poss perf = NO NSAIDs   Penicillins Other (See Comments)   Pt was young unsure exactly Unknown        Medication List     STOP taking these medications    doxycycline 100 MG tablet Commonly known as: VIBRA-TABS       TAKE these medications    acetaminophen  325 MG tablet Commonly known as: TYLENOL  Take 2 tablets (650 mg total) by mouth every 6 (six) hours as needed for mild pain (pain score 1-3) or moderate pain (pain score 4-6).   cephALEXin  500 MG capsule Commonly known as: KEFLEX  Take 1 capsule (500 mg total) by mouth 3 (three) times daily for 2 days.   cyclobenzaprine  10 MG tablet Commonly known as: FLEXERIL  Take 1 tablet (10 mg total) by mouth 3 (three) times daily as needed for muscle spasms.   hydrochlorothiazide  25 MG tablet Commonly known as: HYDRODIURIL  Take 1 tablet (25 mg total) by mouth daily.   HYDROcodone -acetaminophen  5-325 MG tablet Commonly known as: NORCO/VICODIN Take 1 tablet by mouth every 4 (four) hours as needed for moderate pain (pain score 4-6) ((score 4 to 6)).   lisinopril  10 MG tablet Commonly known as: ZESTRIL  Take 1 tablet (10 mg total) by mouth daily.   metroNIDAZOLE  500 MG tablet Commonly known as: FLAGYL  Take 1 tablet (500 mg total) by mouth 2 (two) times daily for 5 days.   ondansetron  4 MG tablet Commonly known as: ZOFRAN  Take 1 tablet (4 mg total) by mouth every 6 (six) hours as needed for nausea.   pantoprazole  40 MG tablet Commonly known as: PROTONIX  Take 1 tablet (40 mg total) by mouth 2 (two) times daily.   sucralfate  1 g tablet Commonly known as: CARAFATE  Take 1 tablet (1 g total) by mouth 3 (three) times daily with meals as needed (for dysphagia).   traMADol  50 MG tablet Commonly known as: ULTRAM  Take  0.5-1 tablets (25-50 mg total) by mouth every 12 (twelve) hours as needed for severe pain (pain score 7-10).        Follow-up Information     Sheldon Standing, MD. Call today.   Specialties: General Surgery, Colon and Rectal Surgery Why: Call to confirm appointment. This has been arranged with Dr. Sheldon (Follow up has been scheduled in 12/2023) Contact information: [address] [place] [address] [phone]         Establish with a PCP as soon as able Follow up.          [facility]) Follow up.   Specialty: Home Health Services Contact information: [address] [place]  [address] [phone]               Allergies  Allergen  Reactions   Codeine Nausea And Vomiting   Nsaids Other (See Comments)    H/o ulcers & poss perf = NO NSAIDs   Penicillins Other (See Comments)    Pt was young unsure exactly  Unknown     Procedures/Studies: CT GUIDED VISCERAL FLUID DRAIN BY PERC CATH Result Date: 12/10/2023 CLINICAL DATA:  History of Arlyss patch. Recent CT suggested increase in size of the loculated gas and fluid collection interposed between the gastric antrum and liver. Stable intraperitoneal fluid collection along the right anterolateral abdominal wall EXAM: CT GUIDED ABDOMINAL ASPIRATION ANESTHESIA/SEDATION: Intravenous Fentanyl  25mcg and Versed  1mg  were administered by RN during a total moderate (conscious) sedation time of 11 minutes; the patient's level of consciousness and physiological / cardiorespiratory status were monitored continuously by radiology RN under my direct supervision. PROCEDURE: The procedure, risks, benefits, and alternatives were explained to the patient. Questions regarding the procedure were encouraged and answered. The patient understands and consents to the procedure. select axial scans through the abdomen were obtained. the right lateral abdominal collection was  localized and an appropriate skin entry site determined and marked. The operative field was prepped with chlorhexidinein a sterile fashion, and a sterile drape was applied covering the operative field. A sterile gown and sterile gloves were used for the procedure. Local anesthesia was provided with 1% Lidocaine . Under CT fluoroscopic guidance, a 19 gauge percutaneous entry needle advanced into the collection. 4 mL of purulent fluid were aspirated, sent for Gram stain and culture. CT fluoro demonstrates resolution of the visible collection. Compared to the previous exam from 2 days ago, there has been interval decrease in size of the loculated subhepatic gas and fluid collections, too small for drain catheter placement. Drainage was deferred. RADIATION DOSE REDUCTION: This exam was performed according to the departmental dose-optimization program which includes automated exposure control, adjustment of the mA and/or kV according to patient size and/or use of iterative reconstruction technique. COMPLICATIONS: None immediate FINDINGS: Stable right lateral abdominal fluid collection. Aspiration returned formal parental material, sent for Gram stain and culture. Interval decrease in size of loculated gas collections in the 7 patent space near the region of the [place] patch. These are now too small to consider drain catheter placement. IMPRESSION: 1. Technically successful aspiration of 4 mL from the right lateral abdominal fluid collection. Electronically Signed   By: JONETTA Faes M.D.   On: 12/10/2023 16:06   CT ABDOMEN PELVIS W CONTRAST Result Date: 12/08/2023 CLINICAL DATA:  Postoperative abdominal pain EXAM: CT ABDOMEN AND PELVIS WITH CONTRAST TECHNIQUE: Multidetector CT imaging of the abdomen and pelvis was performed using the standard protocol following bolus administration of intravenous contrast. RADIATION DOSE REDUCTION: This exam was performed according to the departmental dose-optimization program which  includes automated exposure control, adjustment of the mA and/or kV according to patient size and/or use of iterative reconstruction technique. CONTRAST:  OMNIPAQUE  IOHEXOL  300 MG/ML  SOLN COMPARISON:  12/06/2023 FINDINGS: Lower chest: No acute pleural or parenchymal lung disease. Hepatobiliary: Hepatic steatosis. No acute liver abnormality. The gallbladder is unremarkable. Pancreas: Unremarkable. No pancreatic ductal dilatation or surrounding inflammatory changes. Spleen: Normal in size without focal abnormality. Adrenals/Urinary Tract: Adrenal glands are unremarkable. Kidneys are normal, without renal calculi, focal lesion, or hydronephrosis. Bladder is unremarkable. Stomach/Bowel: No bowel obstruction or ileus. Normal appendix right lower quadrant. Diverticulosis of the descending and sigmoid colon without evidence of acute diverticulitis. Loculated gas and fluid are seen between the gastric antrum and liver capsule, measuring up to 3.9  x 3.5 cm, again tracking along the lesser curvature of the stomach. This has increased in size since the 11/26/2023 and 12/06/2023 exams. Vascular/Lymphatic: Stable aortic atherosclerosis. Borderline enlarged lymphadenopathy most pronounced within the porta hepatis and celiac regions, measuring up to 10 mm in short axis. This is likely reactive. Reproductive: Evaluation of the prostate is limited due to streak artifact from bilateral hip arthroplasties. Other: Stable intraperitoneal fluid collection along the right anterolateral abdominal wall, measuring 4.1 x 1.9 cm. Punctate foci of free gas are again seen in the right upper quadrant compatible with history of perforated ulcer and prior laparoscopy. No abdominal wall hernia. Musculoskeletal: Bilateral hip arthroplasties. No acute or destructive bony abnormalities. IMPRESSION: 1. Increase in size of the loculated gas and fluid collection interposed between the gastric antrum and liver, consistent with known history of  perforated gastric ulcer. 2. Stable intraperitoneal fluid collection along the right anterolateral abdominal wall, favor postoperative seroma. Sterility of fluid cannot be assessed by imaging alone. 3. Distal colonic diverticulosis without diverticulitis. 4. Reactive mesenteric lymphadenopathy. 5. Hepatic steatosis. 6.  Aortic Atherosclerosis (ICD10-I70.0). Electronically Signed   By: Ozell Daring M.D.   On: 12/08/2023 22:11   VAS US  LOWER EXTREMITY VENOUS (DVT) Result Date: 12/08/2023  Lower Venous DVT Study Patient Name:  Theodore Stevenson  Date of Exam:   12/08/2023 Medical Rec #: [id number]          Accession #:    [id number] Date of Birth: [birth year]           Patient Gender: M Patient Age:   72 years Exam Location:  [facility] Procedure:      VAS US  LOWER EXTREMITY VENOUS (DVT) Referring Phys: DELON YATES --------------------------------------------------------------------------------  Indications: Left calf pain.  Risk Factors: Recent surgeries - Cervical discectomy w/ interbody fusion on 11/30/2023 & laproscopic Arlyss patch of perforated ulcer on 11/21/2023. Comparison Study: No previous exams Performing Technologist: Jody Hill RVT, RDMS  Examination Guidelines: A complete evaluation includes B-mode imaging, spectral Doppler, color Doppler, and power Doppler as needed of all accessible portions of each vessel. Bilateral testing is considered an integral part of a complete examination. Limited examinations for reoccurring indications may be performed as noted. The reflux portion of the exam is performed with the patient in reverse Trendelenburg.  +-----+---------------+---------+-----------+----------+--------------+ RIGHTCompressibilityPhasicitySpontaneityPropertiesThrombus Aging +-----+---------------+---------+-----------+----------+--------------+ CFV  Full           Yes      Yes                                  +-----+---------------+---------+-----------+----------+--------------+   +---------+---------------+---------+-----------+----------+--------------+ LEFT     CompressibilityPhasicitySpontaneityPropertiesThrombus Aging +---------+---------------+---------+-----------+----------+--------------+ CFV      Full           Yes      Yes                                 +---------+---------------+---------+-----------+----------+--------------+ SFJ      Full                                                        +---------+---------------+---------+-----------+----------+--------------+ FV Prox  Full           Yes  Yes                                 +---------+---------------+---------+-----------+----------+--------------+ FV Mid   Full           Yes      Yes                                 +---------+---------------+---------+-----------+----------+--------------+ FV DistalFull           Yes      Yes                                 +---------+---------------+---------+-----------+----------+--------------+ PFV      Full                                                        +---------+---------------+---------+-----------+----------+--------------+ POP      Full           Yes      Yes                                 +---------+---------------+---------+-----------+----------+--------------+ PTV      Full                                                        +---------+---------------+---------+-----------+----------+--------------+ PERO     Full                                                        +---------+---------------+---------+-----------+----------+--------------+    Summary: RIGHT: - No evidence of common femoral vein obstruction.   LEFT: - There is no evidence of deep vein thrombosis in the lower extremity.  - No cystic structure found in the popliteal fossa.  *See table(s) above for measurements and observations. Electronically signed  by Lonni Gaskins MD on 12/08/2023 at 11:00:04 AM.    Final    CT Head Wo Contrast Result Date: 12/06/2023 EXAM: CT HEAD WITHOUT CONTRAST 12/06/2023 02:13:30 AM TECHNIQUE: CT of the head was performed without the administration of intravenous contrast. Automated exposure control, iterative reconstruction, and/or weight based adjustment of the mA/kV was utilized to reduce the radiation dose to as low as reasonably achievable. COMPARISON: 11/26/2023 CLINICAL HISTORY: Mental status change, unknown cause. FINDINGS: BRAIN AND VENTRICLES: No acute hemorrhage. No evidence of acute infarct. No hydrocephalus. No extra-axial collection. No mass effect or midline shift. Stable mild periventricular chronic small vessel ischemic change. ORBITS: No acute abnormality. SINUSES: Mild mucosal thickening of right sphenoid sinus. SOFT TISSUES AND SKULL: No acute soft tissue abnormality. No skull fracture. Atherosclerosis of skullbase vasculature. IMPRESSION: 1. No acute intracranial abnormality. 2. Stable mild periventricular chronic small vessel ischemic change. Electronically signed by: Franky Stanford MD 12/06/2023 02:29 AM EST RP Workstation:  [id number]   DG Chest Port 1 View Result Date: 12/06/2023 EXAM: 1 VIEW(S) XRAY OF THE CHEST 12/06/2023 01:46:48 AM COMPARISON: 09/12/2010 CLINICAL HISTORY: ams ams FINDINGS: LUNGS AND PLEURA: No focal pulmonary opacity. No pleural effusion. No pneumothorax. HEART AND MEDIASTINUM: No acute abnormality of the cardiac and mediastinal silhouettes. BONES AND SOFT TISSUES: No acute osseous abnormality. IMPRESSION: 1. No acute cardiopulmonary process. Electronically signed by: Franky Crease MD 12/06/2023 01:50 AM EST RP Workstation: [id number]   CT ABDOMEN PELVIS W CONTRAST Result Date: 12/06/2023 EXAM: CT ABDOMEN AND PELVIS WITH CONTRAST 12/06/2023 12:51:08 AM TECHNIQUE: CT of the abdomen and pelvis was performed with the administration of 100 mL of iohexol  (OMNIPAQUE ) 300 MG/ML solution.  Multiplanar reformatted images are provided for review. Automated exposure control, iterative reconstruction, and/or weight-based adjustment of the mA/kV was utilized to reduce the radiation dose to as low as reasonably achievable. COMPARISON: 11/26/2023 CLINICAL HISTORY: Abdominal pain, acute, nonlocalized. Prior laparoscopy on 11/21/2023. FINDINGS: LOWER CHEST: Minimal right basilar atelectasis is noted. No sizable effusion is seen. LIVER: The liver is fatty infiltrated. GALLBLADDER AND BILE DUCTS: The gallbladder is well distended, similar to that seen on the prior exam. No biliary ductal dilatation. SPLEEN: Spleen is within normal limits. PANCREAS: Pancreas is within normal limits. ADRENAL GLANDS: Adrenal glands are unremarkable. KIDNEYS, URETERS AND BLADDER: Kidneys demonstrate a normal enhancement pattern. No stones in the kidneys or ureters. The bladder is well distended. GI AND BOWEL: Stomach demonstrates no acute abnormality. The small bowel is within normal limits. Scattered diverticular changes of the colon are noted without evidence of diverticulitis. The appendix is within normal limits. Changes consistent with the known perforated gas ulcer are seen with postsurgical changes. No recurrent perforation is identified. There is no bowel obstruction. PERITONEUM AND RETROPERITONEUM: Minimal intraabdominal air is noted but improved when compared with the prior exam. A small fluid collection measuring 4.4 x 2.1 cm is noted in the right upper quadrant, best seen on image 40 of series 2 in the previous tract of the drainage catheter. This may represent a small postoperative seroma. No findings to suggest active extravasation are seen. Some fluid is noted within the omentum, similar to that seen on prior exams in part due to the prior surgery as well as some previous fluid within the abdomen. No ascites. VASCULATURE: Aorta is normal in caliber. Aortic calcifications are seen. LYMPH NODES: No lymphadenopathy.  REPRODUCTIVE ORGANS: The prostate is not well visualized on this exam due to scattered artifact from bilateral hips prostheses. BONES AND SOFT TISSUES: The prior surgical drain in the right upper quadrant has been removed. Bony structures appear within normal limits given the patient's age. IMPRESSION: 1. Postsurgical changes related to known perforated ulcer with no recurrent perforation identified. 2. Small right upper quadrant fluid collection (4.4 x 2.1 cm) in the prior drainage catheter tract, possibly a postoperative seroma. 3. Scattered diverticular changes of the colon without evidence of diverticulitis. Electronically signed by: Oneil Devonshire MD 12/06/2023 01:00 AM EST RP Workstation: [id number]   DG Cervical Spine 1 View Result Date: 11/30/2023 CLINICAL DATA:  Status post ACDF at C3-C4. EXAM: DG CERVICAL SPINE - 1 VIEW COMPARISON:  11/17/2023. FINDINGS: Multiple intraoperative fluoroscopic spot images are provided. Interval ACDF at C3-C4 with interbody cage. Total fluoroscopy time: 4.7 seconds Total dose: Radiation Exposure Index (as provided by the fluoroscopic device): 0.4 mGy air Kerma Please see intraoperative findings for further detail. IMPRESSION: Intraoperative fluoroscopy of interval ACDF at C3-C4. Electronically Signed   By: Harrietta  Lateef M.D.   On: 11/30/2023 13:43   DG C-Arm 1-60 Min-No Report Result Date: 11/30/2023 Fluoroscopy was utilized by the requesting physician.  No radiographic interpretation.   DG C-Arm 1-60 Min-No Report Result Date: 11/30/2023 Fluoroscopy was utilized by the requesting physician.  No radiographic interpretation.   CT ABDOMEN PELVIS W CONTRAST Result Date: 11/26/2023 CLINICAL DATA:  Status post repair perforated gastric ulcer on 11/21/2023. Confusion postoperatively. EXAM: CT ABDOMEN AND PELVIS WITH CONTRAST TECHNIQUE: Multidetector CT imaging of the abdomen and pelvis was performed using the standard protocol following bolus administration of  intravenous contrast. RADIATION DOSE REDUCTION: This exam was performed according to the departmental dose-optimization program which includes automated exposure control, adjustment of the mA and/or kV according to patient size and/or use of iterative reconstruction technique. CONTRAST:  OMNIPAQUE  IOHEXOL  300 MG/ML  SOLN COMPARISON:  None Available. FINDINGS: Lower chest: Small bilateral pleural effusions, right greater than left. Associated right basilar atelectasis. Hepatobiliary: Stable hepatic steatosis. Unremarkable gallbladder. No biliary ductal dilatation. Pancreas: Unremarkable. No pancreatic ductal dilatation or surrounding inflammatory changes. Spleen: No splenic injury or perisplenic hematoma. Adrenals/Urinary Tract: Adrenal glands are unremarkable. Kidneys are normal, without renal calculi, focal lesion, or hydronephrosis. Bladder is unremarkable. Stomach/Bowel: No bowel obstruction or significant ileus. There is some fluid in air in the stomach. Small amount of free intraperitoneal air remains in the anterior peritoneal cavity, likely related to recent surgery. Surgical drain terminates adjacent to the liver and just superior to the pyloric region. Vascular/Lymphatic: Atherosclerosis of the abdominal aorta without aneurysm. No lymphadenopathy identified. Reproductive: Prostate is unremarkable. Other: Scattered small amount of free fluid in the peritoneal cavity, primarily in the lower abdomen and pelvis. No focal marginated abscess identified. Bladder contains a Foley catheter. Musculoskeletal: Degenerative disc disease of the lower lumbar spine. Bilateral hip arthroplasty. IMPRESSION: 1. Small amount of free intraperitoneal air remains in the anterior peritoneal cavity, likely related to recent surgery. 2. Scattered small amount of free fluid in the peritoneal cavity, primarily in the lower abdomen and pelvis. No focal marginated abscess identified. 3. Small bilateral pleural effusions, right  greater than left. Associated right basilar atelectasis. 4. Stable hepatic steatosis. 5. Aortic atherosclerosis. Electronically Signed   By: Marcey Moan M.D.   On: 11/26/2023 15:53   CT HEAD WO CONTRAST ( ) Result Date: 11/26/2023 EXAM: CT HEAD WITHOUT CONTRAST 11/26/2023 01:41:03 PM TECHNIQUE: CT of the head was performed without the administration of intravenous contrast. Automated exposure control, iterative reconstruction, and/or weight based adjustment of the mA/kV was utilized to reduce the radiation dose to as low as reasonably achievable. COMPARISON: None available. CLINICAL HISTORY: 68 year old male. Headache, increasing frequency or severity. FINDINGS: BRAIN AND VENTRICLES: Normal brain volume for age. Mild for age mostly periventricular white matter hypodensity. Otherwise normal gray white differentiation. Normal basilar cisterns. No acute hemorrhage. No evidence of acute infarct. No hydrocephalus. No extra-axial collection. No mass effect or midline shift. Calcified atherosclerosis at the skull base. No suspicious intracranial vascular hyperdensity. ORBITS: No acute abnormality. SINUSES: Mild to moderate bilateral paranasal sinus mucosal thickening, mild bubbly opacity associated. No sinus fluid levels. Tympanic cavities and mastoids appear clear. SOFT TISSUES AND SKULL: Mild left posterior convexity scalp soft tissue scarring. No skull fracture. IMPRESSION: 1. No acute intracranial abnormality. Mild for age cerebral white matter changes most commonly due to small vessel disease. 2. Mild to moderate bilateral paranasal sinus inflammation. Electronically signed by: Helayne Hurst MD 11/26/2023 02:07 PM EST RP Workstation: [id number]   DG UGI W SINGLE  CM (SOL OR THIN BA) Result Date: 11/24/2023 CLINICAL DATA:  Postop from [facility] patch repair of perforated gastric ulcer. Evaluate for postop leak. EXAM: WATER  SOLUBLE UPPER GI SERIES WITH KUB TECHNIQUE: Single-column upper GI series was performed  using 200 mL water  soluble Omnipaque  300 contrast. Radiation Exposure Index (as provided by the fluoroscopic device): 65.2 mGy Kerma COMPARISON:  None Available. FINDINGS: Scout radiograph shows a nasogastric tube, with tip in the gastric antrum. Right abdominal surgical drain also seen. Bowel gas pattern is normal. Single contrast upper GI series was performed during administration water -soluble contrast through the patient's existing nasogastric tube. There is no evidence of contrast leak or extravasation from the stomach or duodenum. Wall irregularity and luminal narrowing is seen involving the distal gastric antrum and duodenum bulb, most likely due to postop edema at site of ulcer repair. No evidence of gastric outlet or duodenum obstruction. IMPRESSION: No evidence of postop contrast leak or obstruction. Luminal narrowing and wall irregularity of the distal gastric antrum and duodenum bulb, most likely due to postop edema. Electronically Signed   By: Norleen DELENA Kil M.D.   On: 11/24/2023 11:45   DG Abd Portable 1 View Result Date: 11/19/2023 EXAM: 1 VIEW XRAY OF THE ABDOMEN 11/19/2023 08:37:00 PM COMPARISON: None available. CLINICAL HISTORY: NGT placement. FINDINGS: LINES, TUBES AND DEVICES: A gastric catheter is noted in the distal stomach. BOWEL: No free air is seen. BONES: No acute osseous abnormality. IMPRESSION: 1. Gastric catheter in the distal stomach. 2. No free intraperitoneal air. Electronically signed by: Oneil Devonshire MD 11/19/2023 08:41 PM EST RP Workstation: [id number]   CT ABDOMEN PELVIS WO CONTRAST Result Date: 11/19/2023 EXAM: CT ABDOMEN AND PELVIS WITHOUT CONTRAST 11/19/2023 06:08:57 PM TECHNIQUE: CT of the abdomen and pelvis was performed without the administration of intravenous contrast. Multiplanar reformatted images are provided for review. Automated exposure control, iterative reconstruction, and/or weight-based adjustment of the mA/kV was utilized to reduce the radiation dose to as  low as reasonably achievable. COMPARISON: CT 07/24/2019 CLINICAL HISTORY: Abdominal pain, acute, nonlocalized. FINDINGS: LOWER CHEST: No acute abnormality. LIVER: Small collection of gas adjacent to the falciform ligament of the liver on image 30/2. Small amount of gas along the capsule of the liver on image 26. GALLBLADDER AND BILE DUCTS: Gallbladder is unremarkable. No biliary ductal dilatation. SPLEEN: No acute abnormality. PANCREAS: No acute abnormality. ADRENAL GLANDS: No acute abnormality. KIDNEYS, URETERS AND BLADDER: No stones in the kidneys or ureters. No hydronephrosis. No perinephric or periureteral stranding. Bladder poorly evaluated due to streak artifact from the bilateral hip prosthetics. GI AND BOWEL: Stomach demonstrates no acute abnormality. There is no bowel obstruction. There is a small collection of gas positioned between the first portion of the duodenum and the left hepatic lobe on image 30 of series 2. This is seen on coronal image 70 of series 8. There are multiple diverticula of the sigmoid colon but no evidence of acute inflammation or perforation. PERITONEUM AND RETROPERITONEUM: There is a small amount of intraperitoneal free air collecting predominantly in the upper abdomen along the ventral peritoneum. For example, small collection of gas adjacent to the falciform ligament of the liver on image 30/2. Small amount of gas along the capsule of the liver on image 26. There is a small collection of gas positioned between the first portion of the duodenum and the left hepatic lobe on image 30 of series 2. This is seen on coronal image 70 of series 8. There is mild inflammatory stranding in the right upper  quadrant. A small amount of fluid along the right anterior pararenal fascia. There is trace intraperitoneal free fluid in the pelvis on the right on image 69/2. VASCULATURE: Aorta is normal in caliber. LYMPH NODES: No lymphadenopathy. REPRODUCTIVE ORGANS: No acute abnormality. BONES AND SOFT  TISSUES: No acute osseous abnormality. No focal soft tissue abnormality. Small amount of intraperitoneal free air predominantly in the upper abdomen with mild inflammatory stranding in the right upper quadrant. Findings are consistent with perforation of the hollow viscus. Most likely source would be proximal duodenum related peptic ulcer disease. Secondary source although less likely would be perforated sigmoid diverticulum. Free fluid in the pelvis. Recommend emergent surgical consultation. Findings conveyed to ordering physician Ruthe, MD at time of interpretation. IMPRESSION: 1. Small amount of intraperitoneal free air predominantly in the upper abdomen with mild inflammatory stranding in the right upper quadrant, consistent with hollow viscus perforation. Most likely source is proximal duodenum (peptic ulcer disease). A perforated sigmoid diverticulum is less likely. 2. Trace intraperitoneal free fluid in the pelvis. 3. Recommend emergent surgical consultation. 4. Findings conveyed to ordering physician Ruthe, MD at time of interpretation. Electronically signed by: Norleen Boxer MD 11/19/2023 07:02 PM EST RP Workstation: [id number]       Discharge Exam: Vitals:   12/10/23 2055 12/11/23 0550  BP: (!) 164/65 135/65  Pulse: 71 67  Resp: 18 17  Temp: 97.7 F (36.5 C) 98.3 F (36.8 C)  SpO2: 100% 100%    General: Pt is alert, awake, not in acute distress Cardiovascular: RRR, S1/S2 +, no edema Respiratory: CTA bilaterally, no wheezing, no rhonchi, no respiratory distress, no conversational dyspnea  Abdominal: Soft, NT, ND, bowel sounds + Extremities: no edema, no cyanosis Psych: Normal mood and affect, stable judgement and insight     The results of significant diagnostics from this hospitalization (including imaging, microbiology, ancillary and laboratory) are listed below for reference.     Microbiology: Recent Results (from the past 240 hours)  Aerobic/Anaerobic Culture w Gram  Stain (surgical/deep wound)     Status: None (Preliminary result)   Collection Time: 12/10/23  3:26 PM   Specimen: Abscess  Result Value Ref Range Status   Specimen Description   Final    ABSCESS Performed at [facility], [address] [place], [place] [address]    Special Requests   Final    NONE Performed at [facility], [address], [place], [place] [address]    Gram Stain   Final    ABUNDANT WBC PRESENT, PREDOMINANTLY PMN ABUNDANT GRAM NEGATIVE RODS RARE GRAM POSITIVE COCCI RARE GRAM POSITIVE RODS    Culture   Final    CULTURE REINCUBATED FOR BETTER GROWTH Performed at [facility], [address] [place], [place] [address]    Report Status PENDING  Incomplete     Labs: BNP (last 3 results) No results for input(s): BNP in the last 8760 hours. Basic Metabolic Panel: Recent Labs  Lab 12/05/23 2332 12/07/23 0423 12/09/23 0435  NA 133* 135 137  K 3.8 3.9 4.0  CL 94* 100 102  CO2 30 27 26   GLUCOSE 112* 105* 115*  BUN 9 12 11   CREATININE 0.96 0.92 0.89  CALCIUM 9.2 8.5* 8.5*   Liver Function Tests: Recent Labs  Lab 12/05/23 2332 12/07/23 0423  AST 66* 37  ALT 144* 88*  ALKPHOS 308* 254*  BILITOT 0.4 0.2  PROT 6.3* 5.7*  ALBUMIN  2.8* 2.6*   Recent Labs  Lab  12/05/23 2332  LIPASE 42   Recent Labs  Lab 12/05/23 2332  AMMONIA 23   CBC: Recent Labs  Lab 12/05/23 2332 12/07/23 0423 12/08/23 0446 12/09/23 0435 12/09/23 1325 12/10/23 0806 12/11/23 0357  WBC 15.8* 15.8* 16.1* 17.5* 17.5* 15.5* 13.8*  NEUTROABS 10.0* 9.4* 9.0*  --  13.0*  --   --   HGB 10.0* 9.1* 9.9* 9.5* 9.2* 10.0* 9.3*  HCT 31.6* 29.2* 32.0* 30.6* 28.8* 31.2* 29.7*  MCV 97.2 99.3 99.7 98.7 98.0 98.7 98.7  PLT 682* 604* 556* 495* 417* 408* 384   Cardiac Enzymes: No results for input(s): CKTOTAL, CKMB, CKMBINDEX, TROPONINI in the last 168 hours. BNP: Invalid input(s): POCBNP CBG: No results for input(s):  GLUCAP in the last 168 hours. D-Dimer No results for input(s): DDIMER in the last 72 hours. Hgb A1c No results for input(s): HGBA1C in the last 72 hours. Lipid Profile No results for input(s): CHOL, HDL, LDLCALC, TRIG, CHOLHDL, LDLDIRECT in the last 72 hours. Thyroid function studies No results for input(s): TSH, T4TOTAL, T3FREE, THYROIDAB in the last 72 hours.  Invalid input(s): FREET3 Anemia work up No results for input(s): VITAMINB12, FOLATE, FERRITIN, TIBC, IRON, RETICCTPCT in the last 72 hours. Urinalysis    Component Value Date/Time   COLORURINE STRAW (A) 12/06/2023 0213   APPEARANCEUR CLEAR 12/06/2023 0213   LABSPEC 1.024 12/06/2023 0213   PHURINE 8.0 12/06/2023 0213   GLUCOSEU NEGATIVE 12/06/2023 0213   HGBUR NEGATIVE 12/06/2023 0213   BILIRUBINUR NEGATIVE 12/06/2023 0213   KETONESUR NEGATIVE 12/06/2023 0213   PROTEINUR NEGATIVE 12/06/2023 0213   UROBILINOGEN 0.2 06/24/2011 1047   NITRITE NEGATIVE 12/06/2023 0213   LEUKOCYTESUR NEGATIVE 12/06/2023 0213   Sepsis Labs Recent Labs  Lab 12/09/23 0435 12/09/23 1325 12/10/23 0806 12/11/23 0357  WBC 17.5* 17.5* 15.5* 13.8*   Microbiology Recent Results (from the past 240 hours)  Aerobic/Anaerobic Culture w Gram Stain (surgical/deep wound)     Status: None (Preliminary result)   Collection Time: 12/10/23  3:26 PM   Specimen: Abscess  Result Value Ref Range Status   Specimen Description   Final    ABSCESS Performed at [facility], [address] [place], [place] [address]    Special Requests   Final    NONE Performed at [facility], [address], [place], [place] [address]    Gram Stain   Final    ABUNDANT WBC PRESENT, PREDOMINANTLY PMN ABUNDANT GRAM NEGATIVE RODS RARE GRAM POSITIVE COCCI RARE GRAM POSITIVE RODS    Culture   Final    CULTURE REINCUBATED FOR BETTER GROWTH Performed at [facility], [address],  [place], [place] [address]    Report Status PENDING  Incomplete     Patient was seen and examined on the day of discharge and was found to be in stable condition. Time coordinating discharge: 35 minutes including assessment and coordination of care, as well as examination of the patient and discussion with family.  SIGNED:  Delon Hoe, DO [facility] 12/11/2023, 11:48 AM

## 2023-12-13 LAB — AEROBIC/ANAEROBIC CULTURE W GRAM STAIN (SURGICAL/DEEP WOUND)

## 2024-01-08 ENCOUNTER — Other Ambulatory Visit (HOSPITAL_COMMUNITY): Payer: Self-pay
